# Patient Record
Sex: Male | Born: 1955 | Race: Black or African American | Hispanic: No | Marital: Married | Smoking: Former smoker
Health system: Southern US, Community
[De-identification: ages and names within clinical notes are randomized; demographics above are authoritative.]

## PROBLEM LIST (undated history)

## (undated) ENCOUNTER — Ambulatory Visit: Admission: EM | Payer: Medicare HMO | Source: Home / Self Care

## (undated) DIAGNOSIS — Z720 Tobacco use: Secondary | ICD-10-CM

## (undated) DIAGNOSIS — J449 Chronic obstructive pulmonary disease, unspecified: Secondary | ICD-10-CM

## (undated) DIAGNOSIS — L409 Psoriasis, unspecified: Secondary | ICD-10-CM

## (undated) DIAGNOSIS — I10 Essential (primary) hypertension: Secondary | ICD-10-CM

## (undated) DIAGNOSIS — I255 Ischemic cardiomyopathy: Secondary | ICD-10-CM

## (undated) DIAGNOSIS — I219 Acute myocardial infarction, unspecified: Secondary | ICD-10-CM

## (undated) DIAGNOSIS — H43393 Other vitreous opacities, bilateral: Secondary | ICD-10-CM

## (undated) DIAGNOSIS — E785 Hyperlipidemia, unspecified: Secondary | ICD-10-CM

## (undated) DIAGNOSIS — I2510 Atherosclerotic heart disease of native coronary artery without angina pectoris: Secondary | ICD-10-CM

## (undated) HISTORY — DX: Tobacco use: Z72.0

## (undated) HISTORY — DX: Hyperlipidemia, unspecified: E78.5

## (undated) HISTORY — DX: Other vitreous opacities, bilateral: H43.393

## (undated) HISTORY — DX: Ischemic cardiomyopathy: I25.5

## (undated) HISTORY — DX: Psoriasis, unspecified: L40.9

## (undated) HISTORY — DX: Chronic obstructive pulmonary disease, unspecified: J44.9

## (undated) HISTORY — DX: Essential (primary) hypertension: I10

---

## 2002-04-25 ENCOUNTER — Encounter: Payer: Self-pay | Admitting: Urology

## 2002-04-25 ENCOUNTER — Ambulatory Visit (HOSPITAL_COMMUNITY): Admission: RE | Admit: 2002-04-25 | Discharge: 2002-04-25 | Payer: Self-pay | Admitting: Urology

## 2002-06-28 ENCOUNTER — Encounter: Payer: Self-pay | Admitting: Urology

## 2002-06-28 ENCOUNTER — Ambulatory Visit (HOSPITAL_COMMUNITY): Admission: RE | Admit: 2002-06-28 | Discharge: 2002-06-28 | Payer: Self-pay | Admitting: Urology

## 2003-08-25 ENCOUNTER — Ambulatory Visit (HOSPITAL_COMMUNITY): Admission: RE | Admit: 2003-08-25 | Discharge: 2003-08-25 | Payer: Self-pay | Admitting: Pulmonary Disease

## 2003-09-04 ENCOUNTER — Ambulatory Visit (HOSPITAL_COMMUNITY): Admission: RE | Admit: 2003-09-04 | Discharge: 2003-09-04 | Payer: Self-pay | Admitting: Pulmonary Disease

## 2015-08-18 DIAGNOSIS — L409 Psoriasis, unspecified: Secondary | ICD-10-CM | POA: Insufficient documentation

## 2015-08-18 LAB — HM HEPATITIS C SCREENING LAB: HM Hepatitis Screen: NEGATIVE

## 2015-09-13 ENCOUNTER — Emergency Department (HOSPITAL_COMMUNITY): Payer: BLUE CROSS/BLUE SHIELD

## 2015-09-13 ENCOUNTER — Encounter (HOSPITAL_COMMUNITY): Payer: Self-pay | Admitting: Emergency Medicine

## 2015-09-13 ENCOUNTER — Encounter (HOSPITAL_COMMUNITY)
Admission: EM | Disposition: A | Discharge status: Home / Self Care | Payer: Self-pay | Source: Home / Self Care | Attending: Cardiovascular Disease

## 2015-09-13 ENCOUNTER — Inpatient Hospital Stay (HOSPITAL_COMMUNITY)
Admission: EM | Admit: 2015-09-13 | Discharge: 2015-09-16 | DRG: 247 | Disposition: A | Discharge status: Home / Self Care | Payer: BLUE CROSS/BLUE SHIELD | Attending: Cardiovascular Disease | Admitting: Cardiovascular Disease

## 2015-09-13 DIAGNOSIS — I2102 ST elevation (STEMI) myocardial infarction involving left anterior descending coronary artery: Secondary | ICD-10-CM

## 2015-09-13 DIAGNOSIS — E785 Hyperlipidemia, unspecified: Secondary | ICD-10-CM | POA: Diagnosis present

## 2015-09-13 DIAGNOSIS — R740 Nonspecific elevation of levels of transaminase and lactic acid dehydrogenase [LDH]: Secondary | ICD-10-CM | POA: Diagnosis present

## 2015-09-13 DIAGNOSIS — R7989 Other specified abnormal findings of blood chemistry: Secondary | ICD-10-CM | POA: Diagnosis present

## 2015-09-13 DIAGNOSIS — I213 ST elevation (STEMI) myocardial infarction of unspecified site: Secondary | ICD-10-CM

## 2015-09-13 DIAGNOSIS — I255 Ischemic cardiomyopathy: Secondary | ICD-10-CM | POA: Diagnosis present

## 2015-09-13 DIAGNOSIS — F17210 Nicotine dependence, cigarettes, uncomplicated: Secondary | ICD-10-CM | POA: Diagnosis present

## 2015-09-13 DIAGNOSIS — I2510 Atherosclerotic heart disease of native coronary artery without angina pectoris: Secondary | ICD-10-CM | POA: Diagnosis present

## 2015-09-13 DIAGNOSIS — Z720 Tobacco use: Secondary | ICD-10-CM | POA: Diagnosis not present

## 2015-09-13 DIAGNOSIS — R079 Chest pain, unspecified: Secondary | ICD-10-CM | POA: Diagnosis present

## 2015-09-13 DIAGNOSIS — Z955 Presence of coronary angioplasty implant and graft: Secondary | ICD-10-CM

## 2015-09-13 DIAGNOSIS — I2109 ST elevation (STEMI) myocardial infarction involving other coronary artery of anterior wall: Secondary | ICD-10-CM | POA: Diagnosis not present

## 2015-09-13 HISTORY — PX: CARDIAC CATHETERIZATION: SHX172

## 2015-09-13 HISTORY — DX: ST elevation (STEMI) myocardial infarction involving left anterior descending coronary artery: I21.02

## 2015-09-13 LAB — CBC WITH DIFFERENTIAL/PLATELET
Basophils Absolute: 0 10*3/uL (ref 0.0–0.1)
Basophils Relative: 0 %
Eosinophils Absolute: 0.3 10*3/uL (ref 0.0–0.7)
Eosinophils Relative: 4 %
HCT: 47.4 % (ref 39.0–52.0)
Hemoglobin: 16 g/dL (ref 13.0–17.0)
Lymphocytes Relative: 43 %
Lymphs Abs: 3.4 10*3/uL (ref 0.7–4.0)
MCH: 30.1 pg (ref 26.0–34.0)
MCHC: 33.8 g/dL (ref 30.0–36.0)
MCV: 89.3 fL (ref 78.0–100.0)
Monocytes Absolute: 0.8 10*3/uL (ref 0.1–1.0)
Monocytes Relative: 11 %
Neutro Abs: 3.2 10*3/uL (ref 1.7–7.7)
Neutrophils Relative %: 42 %
Platelets: 250 10*3/uL (ref 150–400)
RBC: 5.31 MIL/uL (ref 4.22–5.81)
RDW: 13.5 % (ref 11.5–15.5)
WBC: 7.7 10*3/uL (ref 4.0–10.5)

## 2015-09-13 LAB — COMPREHENSIVE METABOLIC PANEL
ALT: 46 U/L (ref 17–63)
AST: 29 U/L (ref 15–41)
Albumin: 4.1 g/dL (ref 3.5–5.0)
Alkaline Phosphatase: 70 U/L (ref 38–126)
Anion gap: 6 (ref 5–15)
BUN: 13 mg/dL (ref 6–20)
CO2: 27 mmol/L (ref 22–32)
Calcium: 9 mg/dL (ref 8.9–10.3)
Chloride: 104 mmol/L (ref 101–111)
Creatinine, Ser: 1.12 mg/dL (ref 0.61–1.24)
GFR calc Af Amer: >60 mL/min (ref >60)
GFR calc non Af Amer: >60 mL/min (ref >60)
Glucose, Bld: 152 mg/dL — ABNORMAL HIGH (ref 65–99)
Potassium: 3.9 mmol/L (ref 3.5–5.1)
Sodium: 137 mmol/L (ref 135–145)
Total Bilirubin: 1 mg/dL (ref 0.3–1.2)
Total Protein: 7.1 g/dL (ref 6.5–8.1)

## 2015-09-13 LAB — MRSA PCR SCREENING: MRSA by PCR: NEGATIVE

## 2015-09-13 LAB — TROPONIN I
Troponin I: 0.27 ng/mL — ABNORMAL HIGH (ref <0.031)
Troponin I: >65 ng/mL (ref <0.031)
Troponin I: >65 ng/mL (ref <0.031)

## 2015-09-13 LAB — CBC
HCT: 48.2 % (ref 39.0–52.0)
Hemoglobin: 16.1 g/dL (ref 13.0–17.0)
MCH: 29.7 pg (ref 26.0–34.0)
MCHC: 33.4 g/dL (ref 30.0–36.0)
MCV: 88.8 fL (ref 78.0–100.0)
Platelets: 226 10*3/uL (ref 150–400)
RBC: 5.43 MIL/uL (ref 4.22–5.81)
RDW: 13.5 % (ref 11.5–15.5)
WBC: 8 10*3/uL (ref 4.0–10.5)

## 2015-09-13 LAB — I-STAT CHEM 8, ED
BUN: 13 mg/dL (ref 6–20)
Calcium, Ion: 1.21 mmol/L (ref 1.13–1.30)
Chloride: 102 mmol/L (ref 101–111)
Creatinine, Ser: 1.1 mg/dL (ref 0.61–1.24)
Glucose, Bld: 151 mg/dL — ABNORMAL HIGH (ref 65–99)
HCT: 50 % (ref 39.0–52.0)
Hemoglobin: 17 g/dL (ref 13.0–17.0)
Potassium: 4.5 mmol/L (ref 3.5–5.1)
Sodium: 140 mmol/L (ref 135–145)
TCO2: 26 mmol/L (ref 0–100)

## 2015-09-13 LAB — I-STAT TROPONIN, ED: Troponin i, poc: 0.22 ng/mL (ref 0.00–0.08)

## 2015-09-13 LAB — CREATININE, SERUM
Creatinine, Ser: 0.99 mg/dL (ref 0.61–1.24)
GFR calc Af Amer: >60 mL/min (ref >60)
GFR calc non Af Amer: >60 mL/min (ref >60)

## 2015-09-13 SURGERY — LEFT HEART CATH AND CORONARY ANGIOGRAPHY
Anesthesia: LOCAL

## 2015-09-13 MED ORDER — ASPIRIN 81 MG PO CHEW: 81.0000 mg | CHEWABLE_TABLET | Freq: Every day | ORAL | Status: DC

## 2015-09-13 MED ORDER — ALPRAZOLAM 0.25 MG PO TABS
0.2500 mg | ORAL_TABLET | Freq: Four times a day (QID) | ORAL | Status: DC | PRN
  Administered 2015-09-13: 0.25 mg via ORAL
  Filled 2015-09-13: qty 1

## 2015-09-13 MED ORDER — NITROGLYCERIN 1 MG/10 ML FOR IR/CATH LAB
INTRA_ARTERIAL | Status: DC | PRN
  Administered 2015-09-13 (×2): 200 ug via INTRACORONARY

## 2015-09-13 MED ORDER — SODIUM CHLORIDE 0.9 % IV SOLN: INTRAVENOUS | Status: AC

## 2015-09-13 MED ORDER — NITROGLYCERIN IN D5W 200-5 MCG/ML-% IV SOLN
INTRAVENOUS | Status: AC
  Filled 2015-09-13: qty 250

## 2015-09-13 MED ORDER — FENTANYL CITRATE (PF) 100 MCG/2ML IJ SOLN
INTRAMUSCULAR | Status: AC
  Filled 2015-09-13: qty 2

## 2015-09-13 MED ORDER — PRASUGREL HCL 10 MG PO TABS
10.0000 mg | ORAL_TABLET | Freq: Every day | ORAL | Status: DC
  Administered 2015-09-14 – 2015-09-16 (×3): 10 mg via ORAL
  Filled 2015-09-13 (×3): qty 1

## 2015-09-13 MED ORDER — SODIUM CHLORIDE 0.9 % IJ SOLN: 3.0000 mL | INTRAMUSCULAR | Status: DC | PRN

## 2015-09-13 MED ORDER — SODIUM CHLORIDE 0.9 % IJ SOLN
3.0000 mL | Freq: Two times a day (BID) | INTRAMUSCULAR | Status: DC
  Administered 2015-09-13: 3 mL via INTRAVENOUS
  Administered 2015-09-14: 6 mL via INTRAVENOUS
  Administered 2015-09-15 (×2): 3 mL via INTRAVENOUS

## 2015-09-13 MED ORDER — ENOXAPARIN SODIUM 40 MG/0.4ML ~~LOC~~ SOLN
40.0000 mg | SUBCUTANEOUS | Status: DC
  Administered 2015-09-14 – 2015-09-15 (×2): 40 mg via SUBCUTANEOUS
  Filled 2015-09-13 (×2): qty 0.4

## 2015-09-13 MED ORDER — PRASUGREL HCL 10 MG PO TABS
ORAL_TABLET | ORAL | Status: DC | PRN
  Administered 2015-09-13: 60 mg via ORAL

## 2015-09-13 MED ORDER — LISINOPRIL 5 MG PO TABS
5.0000 mg | ORAL_TABLET | Freq: Every day | ORAL | Status: DC
  Administered 2015-09-13 – 2015-09-16 (×4): 5 mg via ORAL
  Filled 2015-09-13 (×4): qty 1

## 2015-09-13 MED ORDER — PRASUGREL HCL 10 MG PO TABS
ORAL_TABLET | ORAL | Status: AC
  Filled 2015-09-13: qty 6

## 2015-09-13 MED ORDER — NITROGLYCERIN 1 MG/10 ML FOR IR/CATH LAB
INTRA_ARTERIAL | Status: AC
  Filled 2015-09-13: qty 10

## 2015-09-13 MED ORDER — SODIUM CHLORIDE 0.9 % IV SOLN: 250.0000 mL | INTRAVENOUS | Status: DC | PRN

## 2015-09-13 MED ORDER — NITROGLYCERIN IN D5W 200-5 MCG/ML-% IV SOLN
5.0000 ug/min | Freq: Once | INTRAVENOUS | Status: AC
  Administered 2015-09-13: 10 ug/min via INTRAVENOUS

## 2015-09-13 MED ORDER — CARVEDILOL 3.125 MG PO TABS
3.1250 mg | ORAL_TABLET | Freq: Two times a day (BID) | ORAL | Status: DC
  Administered 2015-09-14: 3.125 mg via ORAL
  Filled 2015-09-13: qty 1

## 2015-09-13 MED ORDER — ATORVASTATIN CALCIUM 80 MG PO TABS
80.0000 mg | ORAL_TABLET | Freq: Every day | ORAL | Status: DC
  Administered 2015-09-14: 80 mg via ORAL
  Filled 2015-09-13: qty 1

## 2015-09-13 MED ORDER — HEPARIN (PORCINE) IN NACL 100-0.45 UNIT/ML-% IJ SOLN
INTRAMUSCULAR | Status: AC
Start: 2015-09-13 — End: 2015-09-13
  Filled 2015-09-13: qty 250

## 2015-09-13 MED ORDER — BIVALIRUDIN 250 MG IV SOLR
INTRAVENOUS | Status: AC
  Filled 2015-09-13: qty 250

## 2015-09-13 MED ORDER — MIDAZOLAM HCL 2 MG/2ML IJ SOLN
INTRAMUSCULAR | Status: AC
  Filled 2015-09-13: qty 2

## 2015-09-13 MED ORDER — ONDANSETRON HCL 4 MG/2ML IJ SOLN: 4.0000 mg | Freq: Four times a day (QID) | INTRAMUSCULAR | Status: DC | PRN

## 2015-09-13 MED ORDER — ASPIRIN 81 MG PO CHEW
324.0000 mg | CHEWABLE_TABLET | Freq: Once | ORAL | Status: AC
  Administered 2015-09-13: 324 mg via ORAL

## 2015-09-13 MED ORDER — SODIUM CHLORIDE 0.9 % IV SOLN
250.0000 mg | INTRAVENOUS | Status: DC | PRN
  Administered 2015-09-13: 1.75 mg/kg/h via INTRAVENOUS

## 2015-09-13 MED ORDER — HEPARIN SODIUM (PORCINE) 5000 UNIT/ML IJ SOLN
4000.0000 [IU] | Freq: Once | INTRAMUSCULAR | Status: AC
Start: 2015-09-13 — End: 2015-09-13
  Administered 2015-09-13: 4000 [IU] via INTRAVENOUS

## 2015-09-13 MED ORDER — IOHEXOL 350 MG/ML SOLN
INTRAVENOUS | Status: DC | PRN
  Administered 2015-09-13: 155 mL via INTRAVENOUS

## 2015-09-13 MED ORDER — FENTANYL CITRATE (PF) 100 MCG/2ML IJ SOLN
INTRAMUSCULAR | Status: DC | PRN
  Administered 2015-09-13 (×2): 25 ug via INTRAVENOUS

## 2015-09-13 MED ORDER — VERAPAMIL HCL 2.5 MG/ML IV SOLN
INTRAVENOUS | Status: DC | PRN
  Administered 2015-09-13: 09:00:00 via INTRA_ARTERIAL

## 2015-09-13 MED ORDER — LIDOCAINE HCL (PF) 1 % IJ SOLN
INTRAMUSCULAR | Status: DC | PRN
  Administered 2015-09-13: 10:00:00

## 2015-09-13 MED ORDER — VERAPAMIL HCL 2.5 MG/ML IV SOLN
INTRAVENOUS | Status: AC
  Filled 2015-09-13: qty 2

## 2015-09-13 MED ORDER — LIDOCAINE HCL (PF) 1 % IJ SOLN
INTRAMUSCULAR | Status: AC
  Filled 2015-09-13: qty 30

## 2015-09-13 MED ORDER — BIVALIRUDIN BOLUS VIA INFUSION - CUPID
INTRAVENOUS | Status: DC | PRN
  Administered 2015-09-13: 62.925 mg via INTRAVENOUS

## 2015-09-13 MED ORDER — ASPIRIN 81 MG PO CHEW
CHEWABLE_TABLET | ORAL | Status: AC
Start: 2015-09-13 — End: 2015-09-13
  Filled 2015-09-13: qty 4

## 2015-09-13 MED ORDER — ACETAMINOPHEN 325 MG PO TABS: 650.0000 mg | ORAL_TABLET | ORAL | Status: DC | PRN

## 2015-09-13 MED ORDER — MIDAZOLAM HCL 2 MG/2ML IJ SOLN
INTRAMUSCULAR | Status: DC | PRN
  Administered 2015-09-13: 1 mg via INTRAVENOUS

## 2015-09-13 MED ORDER — HEPARIN (PORCINE) IN NACL 2-0.9 UNIT/ML-% IJ SOLN
INTRAMUSCULAR | Status: AC
  Filled 2015-09-13: qty 1000

## 2015-09-13 SURGICAL SUPPLY — 21 items
BALLN EMERGE MR 2.5X15 (BALLOONS) ×2
BALLN ~~LOC~~ EUPHORA RX 3.5X15 (BALLOONS) ×2
BALLN ~~LOC~~ EUPHORA RX 3.5X20 (BALLOONS) ×2
BALLOON EMERGE MR 2.5X15 (BALLOONS) ×1 IMPLANT
BALLOON ~~LOC~~ EUPHORA RX 3.5X15 (BALLOONS) ×1 IMPLANT
BALLOON ~~LOC~~ EUPHORA RX 3.5X20 (BALLOONS) ×1 IMPLANT
CATH HEARTRAIL 6F IL3.5 (CATHETERS) ×2 IMPLANT
CATH INFINITI 5FR ANG PIGTAIL (CATHETERS) ×2 IMPLANT
CATH INFINITI JR4 5F (CATHETERS) ×2 IMPLANT
DEVICE RAD COMP TR BAND LRG (VASCULAR PRODUCTS) ×2 IMPLANT
ELECT DEFIB PAD ADLT CADENCE (PAD) ×2 IMPLANT
GLIDESHEATH SLEND SS 6F .021 (SHEATH) ×2 IMPLANT
KIT ENCORE 26 ADVANTAGE (KITS) ×2 IMPLANT
KIT HEART LEFT (KITS) ×2 IMPLANT
PACK CARDIAC CATHETERIZATION (CUSTOM PROCEDURE TRAY) ×2 IMPLANT
STENT PROMUS PREM MR 3.0X24 (Permanent Stent) ×2 IMPLANT
SYR MEDRAD MARK V 150ML (SYRINGE) ×2 IMPLANT
TRANSDUCER W/STOPCOCK (MISCELLANEOUS) ×2 IMPLANT
TUBING CIL FLEX 10 FLL-RA (TUBING) ×2 IMPLANT
WIRE RUNTHROUGH .014X180CM (WIRE) ×4 IMPLANT
WIRE SAFE-T 1.5MM-J .035X260CM (WIRE) ×2 IMPLANT

## 2015-09-13 NOTE — H&P (Signed)
History and Physical  Patient ID: Grant Cardenas MRN: [id number] DOB/AGE: 10-Mar-1956 59 y.o. Admit date: 09/13/2015  Primary Care Physician: Alonza Bogus, MD Primary Cardiologist : New (He will follow up with me in [place]).   HPI:  This is a pleasant 59 year old African-American male with known history of hyperlipidemia not on medications, prolonged history of tobacco use (one pack per day since he was 13) and psoriasis. He has no previous cardiac history. He presented with chest pain which started last night around 11 PM but initially was mild and intermittent. He was able to go to sleep but woke up this morning with intense substernal chest tightness which was severe. Thus, he went to the emergency room at [facility] where he was found to have anterior ST elevation on his EKG. Thus, he was transferred for emergent cardiac catheterization.  Review of systems complete and found to be negative unless listed above  Past Medical History  Diagnosis Date  . High cholesterol     Family History  Problem Relation Age of Onset  . Cancer Other     Social History   Social History  . Marital Status: Married    Spouse Name: N/A  . Number of Children: N/A  . Years of Education: N/A   Occupational History  . Not on file.   Social History Main Topics  . Smoking status: Current Every Day Smoker -- 40 years    Types: Cigarettes  . Smokeless tobacco: Not on file  . Alcohol Use: No  . Drug Use: No  . Sexual Activity: No   Other Topics Concern  . Not on file   Social History Narrative  . No narrative on file    History reviewed. No pertinent past surgical history.   No prescriptions prior to admission    Physical Exam: Blood pressure 136/82, pulse 85, temperature 97.7 F (36.5 C), temperature source Oral, resp. rate 18, height 6\' 1"  (1.854 m), weight 185 lb (83.915 kg), SpO2 99 %.   Constitutional: He is oriented to person, place, and time. He appears  well-developed and well-nourished. No distress.  HENT: No nasal discharge.  Head: Normocephalic and atraumatic.  Eyes: Pupils are equal and round.  No discharge. Neck: Normal range of motion. Neck supple. No JVD present. No thyromegaly present.  Cardiovascular: Normal rate, regular rhythm, normal heart sounds. Exam reveals no gallop and no friction rub. No murmur heard.  Pulmonary/Chest: Effort normal and breath sounds normal. No stridor. No respiratory distress. He has no wheezes. He has no rales. He exhibits no tenderness.  Abdominal: Soft. Bowel sounds are normal. He exhibits no distension. There is no tenderness. There is no rebound and no guarding.  Musculoskeletal: Normal range of motion. He exhibits no edema and no tenderness.  Neurological: He is alert and oriented to person, place, and time. Coordination normal.  Skin: Skin is warm and dry. No rash noted. He is not diaphoretic. No erythema. No pallor.  Psychiatric: He has a normal mood and affect. His behavior is normal. Judgment and thought content normal.      Labs:   Lab Results  Component Value Date   WBC 7.7 09/13/2015   HGB 17.0 09/13/2015   HCT 50.0 09/13/2015   MCV 89.3 09/13/2015   PLT 250 09/13/2015    Recent Labs Lab 09/13/15 0718 09/13/15 0722  NA 137 140  K 3.9 4.5  CL 104 102  CO2 27  --   BUN 13 13  CREATININE  1.12 1.10  CALCIUM 9.0  --   PROT 7.1  --   BILITOT 1.0  --   ALKPHOS 70  --   ALT 46  --   AST 29  --   GLUCOSE 152* 151*   Lab Results  Component Value Date   TROPONINI 0.27* 09/13/2015   No results found for: CHOL No results found for: HDL No results found for: LDLCALC No results found for: TRIG No results found for: CHOLHDL No results found for: LDLDIRECT    Radiology: Dg Chest Portable 1 View  09/13/2015  CLINICAL DATA:  Chest pain with myocardial infarction EXAM: PORTABLE CHEST 1 VIEW COMPARISON:  Chest CT September 04, 2003 FINDINGS: There is scarring in the right mid and  lower lung zones. There is asymmetric pleural thickening on the right, also stable. There is no frank edema or consolidation. Heart size and pulmonary vascularity normal. No adenopathy. No bone lesions. IMPRESSION: Scarring in the right mid and lower lung zones, also present on prior examination. No frank edema or consolidation. Cardiac silhouette within normal limits. Electronically Signed   By: Lowella Grip III M.D.   On: 09/13/2015 07:42    EKG:  Normal sinus rhythm with massive anterolateral ST elevation.  ASSESSMENT AND PLAN:  1. Anterior ST elevation myocardial infarction: The patient underwent cardiac catheterization which showed occluded proximal LAD. He was treated successfully with angioplasty and drug-eluting stent placement with no immediate complications. Ejection fraction was 35-40%. He does have significant residual diffuse disease affecting the distal LAD, distal ramus and distal left circumflex and moderate stenosis in the ostial right coronary artery. These can be treated medically with no requirement for staged PCI at the present time. 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 He will require a stress test in 4-6 weeks before he can be cleared to resume work. He is thinking about retirement in February but might apply for short-term disability. Regarding his medications, continue dual antiplatelet therapy for at least 12 months.  2. Ischemic cardiomyopathy: I started small dose carvedilol and lisinopril. Repeat echocardiogram tomorrow.  3. Tobacco use: Smoking cessation was strongly advised and the patient will require a lot of education. He was referred to the cardiac rehabilitation.   Signed: Kathlyn Sacramento MD, [facility] 09/13/2015, 10:06 AM

## 2015-09-13 NOTE — ED Provider Notes (Signed)
CSN: [id number]     Arrival date & time 09/13/15  0709 History   First MD Initiated Contact with Patient 09/13/15 0732     Chief Complaint  Patient presents with  . Chest Pain     (Consider location/radiation/quality/duration/timing/severity/associated sxs/prior Treatment) Patient is a 59 y.o. male presenting with chest pain. The history is provided by the patient.  Chest Pain Associated symptoms: nausea   Associated symptoms: no abdominal pain, no back pain, no fever, no shortness of breath and not vomiting    patient with onset of left-sided chest pain radiating to left arm 11:00 last night. Waxed and waned throughout the night but never went away. Currently pain is 9 out of 10. Associated with nausea no vomiting no shortness of breath. Patient denies any history of any chest problems or heart problems in the past. Patient states only past medical history is psoriasis results medication for that. However chart review shows that he's got a history of high cholesterol and is an everyday smoker.  Past Medical History  Diagnosis Date  . High cholesterol    History reviewed. No pertinent past surgical history. Family History  Problem Relation Age of Onset  . Cancer Other    Social History  Substance Use Topics  . Smoking status: Current Every Day Smoker -- 40 years    Types: Cigarettes  . Smokeless tobacco: None  . Alcohol Use: No    Review of Systems  Constitutional: Negative for fever.  HENT: Negative for congestion.   Eyes: Negative for visual disturbance.  Respiratory: Negative for shortness of breath.   Cardiovascular: Positive for chest pain.  Gastrointestinal: Positive for nausea. Negative for vomiting and abdominal pain.  Genitourinary: Negative for dysuria.  Musculoskeletal: Negative for back pain.  Skin: Negative for rash.  Hematological: Does not bruise/bleed easily.  Psychiatric/Behavioral: Negative for confusion.      Allergies  Review of patient's  allergies indicates no known allergies.  Home Medications   Prior to Admission medications   Not on File   BP 143/84 mmHg  Pulse 80  Temp(Src) 97.7 F (36.5 C) (Oral)  Resp 18  Ht 6\' 1"  (1.854 m)  Wt 83.915 kg  BMI 24.41 kg/m2  SpO2 100% Physical Exam  Constitutional: He is oriented to person, place, and time. He appears well-developed and well-nourished. No distress.  HENT:  Head: Normocephalic and atraumatic.  Mouth/Throat: Oropharynx is clear and moist.  Eyes: Conjunctivae and EOM are normal. Pupils are equal, round, and reactive to light.  Neck: Normal range of motion. Neck supple.  Cardiovascular: Normal rate, regular rhythm and normal heart sounds.   No murmur heard. Pulmonary/Chest: Effort normal and breath sounds normal. No respiratory distress.  Abdominal: Soft. Bowel sounds are normal. There is no tenderness.  Musculoskeletal: Normal range of motion.  Neurological: He is alert and oriented to person, place, and time. No cranial nerve deficit. He exhibits normal muscle tone. Coordination normal.  Skin: Skin is warm. No rash noted.  Nursing note and vitals reviewed.   ED Course  Procedures (including critical care time) Labs Review Labs Reviewed  I-STAT CHEM 8, ED - Abnormal; Notable for the following:    Glucose, Bld 151 (*)    All other components within normal limits  I-STAT TROPOININ, ED - Abnormal; Notable for the following:    Troponin i, poc 0.22 (*)    All other components within normal limits  CBC WITH DIFFERENTIAL/PLATELET  COMPREHENSIVE METABOLIC PANEL  TROPONIN I   Results for  orders placed or performed during the hospital encounter of 09/13/15  I-stat chem 8, ed  Result Value Ref Range   Sodium 140 135 - 145 mmol/L   Potassium 4.5 3.5 - 5.1 mmol/L   Chloride 102 101 - 111 mmol/L   BUN 13 6 - 20 mg/dL   Creatinine, Ser 1.10 0.61 - 1.24 mg/dL   Glucose, Bld 151 (H) 65 - 99 mg/dL   Calcium, Ion 1.21 1.13 - 1.30 mmol/L   TCO2 26 0 - 100 mmol/L    Hemoglobin 17.0 13.0 - 17.0 g/dL   HCT 50.0 39.0 - 52.0 %  I-stat troponin, ED  Result Value Ref Range   Troponin i, poc 0.22 (HH) 0.00 - 0.08 ng/mL   Comment NOTIFIED PHYSICIAN    Comment 3             Imaging Review No results found. I have personally reviewed and evaluated these images and lab results as part of my medical decision-making.   EKG Interpretation   Date/Time:  Sunday September 13 2015 07:14:33 EST Ventricular Rate:  73 PR Interval:  180 QRS Duration: 96 QT Interval:  383 QTC Calculation: 422 R Axis:   81 Text Interpretation:  \E\Sinus rhythm Consider left atrial enlargement  Extensive anterior infarct, acute (LAD) ST elevation, consider inferior  injury no0previous Confirmed by ZACKOWSKI  MD, SCOTT ([id number]) on 09/13/2015  7:34:21 AM      CRITICAL CARE Performed by: Fredia Sorrow Total critical care time: 30 minutes Critical care time was exclusive of separately billable procedures and treating other patients. Critical care was necessary to treat or prevent imminent or life-threatening deterioration. Critical care was time spent personally by me on the following activities: development of treatment plan with patient and/or surrogate as well as nursing, discussions with consultants, evaluation of patient's response to treatment, examination of patient, obtaining history from patient or surrogate, ordering and performing treatments and interventions, ordering and review of laboratory studies, ordering and review of radiographic studies, pulse oximetry and re-evaluation of patient's condition.    MDM   Final diagnoses:  ST elevation myocardial infarction (STEMI), unspecified artery ([place])   Patient with onset of left-sided chest pain rating the left arm 11:00 last night. EKG with the questionable inferior anterior lateral MI changes also could be related to early repull no EKG for comparison. Patient's story is very solid most likely is related to an MI.  Patient's initial point of care troponin came back elevated at 0.22 patient started on heparin and nitroglycerin given aspirin chest x-ray without any acute findings by my interpretation on the machine. Contacted the STEMI cardiology Dr. Fletcher Anon [facility] patient will be transported by EMS.  In addition patient without any cardiac risk factors that he isn't aware of. Patient denied any issues however past medical she does list high cholesterol. And patient is a everyday smoker.  Fredia Sorrow, MD 09/13/15 [phone]

## 2015-09-13 NOTE — Progress Notes (Signed)
Utilization review completed.  

## 2015-09-13 NOTE — ED Notes (Signed)
Patient c/o left side chest pain that radiates down left arm. Denies any shortness of breath but reports feeling nauseated. Patient states pain started last night at 11pm and subsided when he went to bed, pain returned this morning at 6am while walking dog.

## 2015-09-13 NOTE — ED Notes (Signed)
[facility] advised to call EMS

## 2015-09-13 NOTE — ED Notes (Signed)
Called [facility] for code STEMI per verbal order Dr. Rogene Houston

## 2015-09-14 ENCOUNTER — Ambulatory Visit (HOSPITAL_COMMUNITY): Payer: BLUE CROSS/BLUE SHIELD

## 2015-09-14 ENCOUNTER — Encounter (HOSPITAL_COMMUNITY): Payer: Self-pay | Admitting: Cardiovascular Disease

## 2015-09-14 DIAGNOSIS — R7989 Other specified abnormal findings of blood chemistry: Secondary | ICD-10-CM

## 2015-09-14 DIAGNOSIS — I255 Ischemic cardiomyopathy: Secondary | ICD-10-CM

## 2015-09-14 DIAGNOSIS — Z720 Tobacco use: Secondary | ICD-10-CM

## 2015-09-14 DIAGNOSIS — I2510 Atherosclerotic heart disease of native coronary artery without angina pectoris: Secondary | ICD-10-CM

## 2015-09-14 LAB — HEPATIC FUNCTION PANEL
ALT: 86 U/L — ABNORMAL HIGH (ref 17–63)
AST: 307 U/L — ABNORMAL HIGH (ref 15–41)
Albumin: 3.5 g/dL (ref 3.5–5.0)
Alkaline Phosphatase: 65 U/L (ref 38–126)
Bilirubin, Direct: 0.2 mg/dL (ref 0.1–0.5)
Indirect Bilirubin: 1.8 mg/dL — ABNORMAL HIGH (ref 0.3–0.9)
Total Bilirubin: 2 mg/dL — ABNORMAL HIGH (ref 0.3–1.2)
Total Protein: 6.3 g/dL — ABNORMAL LOW (ref 6.5–8.1)

## 2015-09-14 LAB — LIPID PANEL
Cholesterol: 271 mg/dL — ABNORMAL HIGH (ref 0–200)
HDL: 40 mg/dL — ABNORMAL LOW (ref >40)
LDL Cholesterol: 208 mg/dL — ABNORMAL HIGH (ref 0–99)
Total CHOL/HDL Ratio: 6.8 RATIO
Triglycerides: 116 mg/dL (ref <150)
VLDL: 23 mg/dL (ref 0–40)

## 2015-09-14 LAB — CBC
HCT: 47.3 % (ref 39.0–52.0)
Hemoglobin: 15.6 g/dL (ref 13.0–17.0)
MCH: 29.3 pg (ref 26.0–34.0)
MCHC: 33 g/dL (ref 30.0–36.0)
MCV: 88.7 fL (ref 78.0–100.0)
Platelets: 231 10*3/uL (ref 150–400)
RBC: 5.33 MIL/uL (ref 4.22–5.81)
RDW: 13.7 % (ref 11.5–15.5)
WBC: 7.1 10*3/uL (ref 4.0–10.5)

## 2015-09-14 LAB — BRAIN NATRIURETIC PEPTIDE: B Natriuretic Peptide: 77.1 pg/mL (ref 0.0–100.0)

## 2015-09-14 LAB — BASIC METABOLIC PANEL
Anion gap: 8 (ref 5–15)
BUN: 7 mg/dL (ref 6–20)
CO2: 23 mmol/L (ref 22–32)
Calcium: 9.1 mg/dL (ref 8.9–10.3)
Chloride: 107 mmol/L (ref 101–111)
Creatinine, Ser: 1.01 mg/dL (ref 0.61–1.24)
GFR calc Af Amer: >60 mL/min (ref >60)
GFR calc non Af Amer: >60 mL/min (ref >60)
Glucose, Bld: 107 mg/dL — ABNORMAL HIGH (ref 65–99)
Potassium: 4 mmol/L (ref 3.5–5.1)
Sodium: 138 mmol/L (ref 135–145)

## 2015-09-14 LAB — TROPONIN I
Troponin I: >65 ng/mL (ref <0.031)
Troponin I: >65 ng/mL (ref <0.031)

## 2015-09-14 LAB — POCT ACTIVATED CLOTTING TIME: Activated Clotting Time: 477 seconds

## 2015-09-14 MED ORDER — CARVEDILOL 3.125 MG PO TABS
3.1250 mg | ORAL_TABLET | Freq: Once | ORAL | Status: AC
  Administered 2015-09-14: 3.125 mg via ORAL
  Filled 2015-09-14: qty 1

## 2015-09-14 MED ORDER — CARVEDILOL 6.25 MG PO TABS
6.2500 mg | ORAL_TABLET | Freq: Two times a day (BID) | ORAL | Status: DC
  Administered 2015-09-14 – 2015-09-16 (×4): 6.25 mg via ORAL
  Filled 2015-09-14 (×4): qty 1

## 2015-09-14 NOTE — Progress Notes (Signed)
Subjective:    Day of hospitalization: 1   No chest pain, feels good.    Objective:   Temp:  [97.8 F (36.6 C)-99 F (37.2 C)] 97.8 F (36.6 C) (12/12 0749) Pulse Rate:  [61-88] 84 (12/12 0755) Resp:  [14-36] 19 (12/12 0500) BP: (103-162)/(63-105) 103/63 mmHg (12/12 0749) SpO2:  [98 %-100 %] 99 % (12/12 0749) Weight:  [80.8 kg (178 lb 2.1 oz)] 80.8 kg (178 lb 2.1 oz) (12/11 1000) Last BM Date: 09/12/15  Filed Weights   09/13/15 0717 09/13/15 1000  Weight: 83.915 kg (185 lb) 80.8 kg (178 lb 2.1 oz)    Intake/Output Summary (Last 24 hours) at 09/14/15 0946 Last data filed at 09/13/15 2041  Gross per 24 hour  Intake   1150 ml  Output   1700 ml  Net   -550 ml    Physical Exam: General: NAD. HEENT: EOMI. Lungs: CTAB, nonlabored. Cardiac: RRR, no m/r/g. Abdomen: +BS, NT/ND.   Extremities: No LE edema.  Neuro: Alert and oriented x3. Moving all extremities.   Lab Results:  Basic Metabolic Panel:  Recent Labs Lab 09/13/15 0718 09/13/15 0722 09/13/15 1230 09/14/15 0115  NA 137 140  --  138  K 3.9 4.5  --  4.0  CL 104 102  --  107  CO2 27  --   --  23  GLUCOSE 152* 151*  --  107*  BUN 13 13  --  7  CREATININE 1.12 1.10 0.99 1.01  CALCIUM 9.0  --   --  9.1    Liver Function Tests:  Recent Labs Lab 09/13/15 0718 09/14/15 0115  AST 29 307*  ALT 46 86*  ALKPHOS 70 65  BILITOT 1.0 2.0*  PROT 7.1 6.3*  ALBUMIN 4.1 3.5    CBC:  Recent Labs Lab 09/13/15 0718 09/13/15 0722 09/13/15 1230 09/14/15 0115  WBC 7.7  --  8.0 7.1  HGB 16.0 17.0 16.1 15.6  HCT 47.4 50.0 48.2 47.3  MCV 89.3  --  88.8 88.7  PLT 250  --  226 231    Cardiac Enzymes:  Recent Labs Lab 09/13/15 2029 09/14/15 0115 09/14/15 0823  TROPONINI >65.00* >65.00* >65.00*    BNP: No results for input(s): PROBNP in the last 8760 hours.  Coagulation: No results for input(s): INR in the last 168 hours.  Radiology: Dg Chest Portable 1 View  09/13/2015  CLINICAL DATA:   Chest pain with myocardial infarction EXAM: PORTABLE CHEST 1 VIEW COMPARISON:  Chest CT September 04, 2003 FINDINGS: There is scarring in the right mid and lower lung zones. There is asymmetric pleural thickening on the right, also stable. There is no frank edema or consolidation. Heart size and pulmonary vascularity normal. No adenopathy. No bone lesions. IMPRESSION: Scarring in the right mid and lower lung zones, also present on prior examination. No frank edema or consolidation. Cardiac silhouette within normal limits. Electronically Signed   By: Lowella Grip III M.D.   On: 09/13/2015 07:42    Cardiac studies:   ECG:   Medications:   Scheduled Medications: . atorvastatin  80 mg Oral q1800  . carvedilol  3.125 mg Oral Once  . carvedilol  6.25 mg Oral BID WC  . enoxaparin (LOVENOX) injection  40 mg Subcutaneous Q24H  . lisinopril  5 mg Oral Daily  . prasugrel  10 mg Oral Daily  . sodium chloride  3 mL Intravenous Q12H    Infusions:    PRN Medications: sodium chloride,  acetaminophen, ALPRAZolam, ondansetron (ZOFRAN) IV, sodium chloride   Assessment and Plan:   21 YOF with HLD and tobacco abuse with no prior cardiac history admitted to [facility] with CP transferred to [facility] with anterior STEMI.  She underwent emergent cath which showed occluded proximal LAD treated with DES. EF showed 35-40%.    Anterior STEMI Denies current chest pain. -cont effient -add ASA -cont carvedilol, lisinopril  -TTE today   Ischemic cardiomyopathy -cont carvedilol/lisinopril -TTE pending  Elevated transaminases Currently on atorvasttin 80mg .  May be d/t acute MI but also started on statin.  -will trend LFTs -may need to stop given acute elevation   Tobacco abuse -encouraged cessation    Jones Bales, MD PGY-3, Internal Medicine Teaching Service 09/14/2015, 9:46 AM

## 2015-09-14 NOTE — Progress Notes (Signed)
Echocardiogram 2D Echocardiogram has been performed.  Grant Cardenas 09/14/2015, 4:46 PM

## 2015-09-14 NOTE — Care Management Note (Signed)
Case Management Note  Patient Details  Name: SENOVIO YARA MRN: [id number] Date of Birth: 16-Sep-1956  Subjective/Objective:         Adm w mi           Action/Plan: lives w wife, pcp dr Velvet Bathe   Expected Discharge Date:                  Expected Discharge Plan:  Home/Self Care  In-House Referral:     Discharge planning Services  CM Consult, Medication Assistance  Post Acute Care Choice:    Choice offered to:     DME Arranged:    DME Agency:     HH Arranged:    [place] Agency:     Status of Service:     Medicare Important Message Given:    Date Medicare IM Given:    Medicare IM give by:    Date Additional Medicare IM Given:    Additional Medicare Important Message give by:     If discussed at [place] of Stay Meetings, dates discussed:    Additional Comments: gave pt 30day free effient card and 0 copay card for 23months. Pt has bcbs ins/  Lacretia Leigh, RN 09/14/2015, 10:50 AM

## 2015-09-14 NOTE — Progress Notes (Signed)
CARDIAC REHAB PHASE I          MODE:  Ambulation: 700 ft   POST:  Rate/Rhythm: 89 SR  BP:  Supine:   Sitting: 109/61  Standing:    SaO2: 97%RA 1045-1205 Pt up in hall walking independently without CP. Tolerated well. MI education completed with pt and wife who voiced understanding. Discussed stent and importance of effient. Has booklet. Discussed CRP 2 and referring to [place]. Pt given fake cigarette and smoking cessation handout and we discussed smoking cessation. He plans to quit cold [place]. Discussed heart healthy diet. Pt used to eating a lot of sweets at a time. Discussed ways to cut back but pt stated I was killing him taking everything away.. Pt's wife very supportive. Pt will need to make dietary changes.   Grant Good, RN BSN  09/14/2015 12:04 PM

## 2015-09-14 NOTE — Progress Notes (Signed)
EKG CRITICAL VALUE     12 lead EKG performed.  Critical value noted.MAE Manfred Shirts, RN notified.   BROWNING, HORACE L, CCT 09/14/2015 7:44 AM

## 2015-09-14 NOTE — Progress Notes (Signed)
Subjective:  Day 1 s/p Anterior MI; feels well without chest pain or dyspnea.  Objective:   Vital Signs : Filed Vitals:   09/14/15 0400 09/14/15 0500 09/14/15 0749 09/14/15 0755  BP: 121/70 110/73 103/63   Pulse:    84  Temp:  99 F (37.2 C) 97.8 F (36.6 C)   TempSrc:  Oral Oral   Resp: 20 19    Height:      Weight:      SpO2:  98% 99%     Intake/Output from previous day:  Intake/Output Summary (Last 24 hours) at 09/14/15 0801 Last data filed at 09/13/15 2041  Gross per 24 hour  Intake   1150 ml  Output   1700 ml  Net   -550 ml    I/O since admission: -550  Wt Readings from Last 3 Encounters:  09/13/15 178 lb 2.1 oz (80.8 kg)    Medications: . atorvastatin  80 mg Oral q1800  . carvedilol  3.125 mg Oral BID WC  . enoxaparin (LOVENOX) injection  40 mg Subcutaneous Q24H  . lisinopril  5 mg Oral Daily  . prasugrel  10 mg Oral Daily  . sodium chloride  3 mL Intravenous Q12H      Physical Exam:   General appearance: no distress Neck: no adenopathy, no carotid bruit, no JVD and thyroid not enlarged, symmetric, no tenderness/mass/nodules Lungs: decreased BS without wheezing Heart: regular rate and rhythm and 1/6 sem; no s3; no rub Abdomen: soft, non-tender; bowel sounds normal; no masses,  no organomegaly Extremities: no edema, redness or tenderness in the calves or thighs Pulses: 2+ and symmetric Skin: Skin color, texture, turgor normal. No rashes or lesions Neurologic: Grossly normal   Rate: 85  Rhythm: normal sinus rhythm  ECG (independently read by me): NSR at 70 with residual anterior ST elevation at 2 mm; improved from 4 mm when compared to 12/11 and resolution of inferior changes.  Lab Results:   Recent Labs  09/13/15 0718 09/13/15 0722 09/13/15 1230 09/14/15 0115  NA 137 140  --  138  K 3.9 4.5  --  4.0  CL 104 102  --  107  CO2 27  --   --  23  GLUCOSE 152* 151*  --  107*  BUN 13 13  --  7  CREATININE 1.12 1.10 0.99 1.01  CALCIUM  9.0  --   --  9.1    Hepatic Function Latest Ref Rng 09/14/2015 09/13/2015  Total Protein 6.5 - 8.1 g/dL 6.3(L) 7.1  Albumin 3.5 - 5.0 g/dL 3.5 4.1  AST 15 - 41 U/L 307(H) 29  ALT 17 - 63 U/L 86(H) 46  Alk Phosphatase 38 - 126 U/L 65 70  Total Bilirubin 0.3 - 1.2 mg/dL 2.0(H) 1.0  Bilirubin, Direct 0.1 - 0.5 mg/dL 0.2 -     Recent Labs  09/13/15 0718 09/13/15 0722 09/13/15 1230 09/14/15 0115  WBC 7.7  --  8.0 7.1  NEUTROABS 3.2  --   --   --   HGB 16.0 17.0 16.1 15.6  HCT 47.4 50.0 48.2 47.3  MCV 89.3  --  88.8 88.7  PLT 250  --  226 231     Recent Labs  09/13/15 1230 09/13/15 2029 09/14/15 0115  TROPONINI >65.00* >65.00* >65.00*    No results found for: TSH No results for input(s): HGBA1C in the last 72 hours.   Recent Labs  09/13/15 0718 09/14/15 0115  PROT 7.1 6.3*  ALBUMIN  4.1 3.5  AST 29 307*  ALT 46 86*  ALKPHOS 70 65  BILITOT 1.0 2.0*  BILIDIR  --  0.2  IBILI  --  1.8*   No results for input(s): INR in the last 72 hours. BNP (last 3 results) No results for input(s): BNP in the last 8760 hours.  ProBNP (last 3 results) No results for input(s): PROBNP in the last 8760 hours.   Lipid Panel     Component Value Date/Time   CHOL 271* 09/14/2015 0115   TRIG 116 09/14/2015 0115   HDL 40* 09/14/2015 0115   CHOLHDL 6.8 09/14/2015 0115   VLDL 23 09/14/2015 0115   LDLCALC 208* 09/14/2015 0115    Imaging:  Dg Chest Portable 1 View  09/13/2015  CLINICAL DATA:  Chest pain with myocardial infarction EXAM: PORTABLE CHEST 1 VIEW COMPARISON:  Chest CT September 04, 2003 FINDINGS: There is scarring in the right mid and lower lung zones. There is asymmetric pleural thickening on the right, also stable. There is no frank edema or consolidation. Heart size and pulmonary vascularity normal. No adenopathy. No bone lesions. IMPRESSION: Scarring in the right mid and lower lung zones, also present on prior examination. No frank edema or consolidation. Cardiac  silhouette within normal limits. Electronically Signed   By: Lowella Grip III M.D.   On: 09/13/2015 07:42    Emergent Cardiac Cath/PCI 09/13/15: Conclusion     Dist LAD lesion, 90% stenosed.  Ost RCA lesion, 50% stenosed.  There is moderate left ventricular systolic dysfunction.  Dist Cx lesion, 70% stenosed.  Ramus lesion, 80% stenosed.  Prox LAD to Mid LAD lesion, 100% stenosed. Post intervention, there is a 0% residual stenosis. The lesion was not previously treated.  1. Severe one-vessel coronary artery disease with thrombotic occlusion of the proximal LAD which is the culprit for presentation. There is also significant stenosis affecting the distal LAD close to the apex, very distal ramus and distal left circumflex . There is also moderate ostial RCA stenosis.  2. Moderately reduced LV systolic function with an ejection fraction of 35-40% with severe distal anterior wall and distal inferior wall hypokinesis. There is akinesis of the apex. 3. Mildly elevated left ventricular end-diastolic pressure. 4. Successful angioplasty and drug-eluting stent placement to the proximal LAD.    Assessment/Plan:   Active Problems:   ST elevation (STEMI) myocardial infarction involving left anterior descending coronary artery ([place])  1. Day 1 s/p Anterior STEMI RX with PCI to LAD; concomitant CAD 2. Ischemic cardiomyopathy with acute EF 35 - 40% 3.  Abnormal LFT's; prob due to MI but will follow since statin started yesterday 4. Marked Hyperlipidemia with LDL 208 5. Tobacco abuse; long standing counseled on cessation  For 2 d echo today per Dr. Fletcher Anon. Will titrtate carvedilol to 6.25 mg bid.  ACE-I started.  F/U LFT's tomorrow and if still increasing will need to hold statin. Will check BNP post large anterior MI and if significantly elevated consider adding aldosterone blockade.  Troy Sine, MD, [facility] 09/14/2015, 8:01 AM

## 2015-09-15 DIAGNOSIS — I2109 ST elevation (STEMI) myocardial infarction involving other coronary artery of anterior wall: Secondary | ICD-10-CM

## 2015-09-15 DIAGNOSIS — E785 Hyperlipidemia, unspecified: Secondary | ICD-10-CM

## 2015-09-15 DIAGNOSIS — F17200 Nicotine dependence, unspecified, uncomplicated: Secondary | ICD-10-CM

## 2015-09-15 LAB — BASIC METABOLIC PANEL
Anion gap: 7 (ref 5–15)
BUN: 15 mg/dL (ref 6–20)
CO2: 24 mmol/L (ref 22–32)
Calcium: 8.8 mg/dL — ABNORMAL LOW (ref 8.9–10.3)
Chloride: 107 mmol/L (ref 101–111)
Creatinine, Ser: 1.27 mg/dL — ABNORMAL HIGH (ref 0.61–1.24)
GFR calc Af Amer: >60 mL/min (ref >60)
GFR calc non Af Amer: 59 mL/min — ABNORMAL LOW (ref >60)
Glucose, Bld: 108 mg/dL — ABNORMAL HIGH (ref 65–99)
Potassium: 3.9 mmol/L (ref 3.5–5.1)
Sodium: 138 mmol/L (ref 135–145)

## 2015-09-15 LAB — HEPATIC FUNCTION PANEL
ALT: 60 U/L (ref 17–63)
AST: 108 U/L — ABNORMAL HIGH (ref 15–41)
Albumin: 3.4 g/dL — ABNORMAL LOW (ref 3.5–5.0)
Alkaline Phosphatase: 63 U/L (ref 38–126)
Bilirubin, Direct: 0.3 mg/dL (ref 0.1–0.5)
Indirect Bilirubin: 1.7 mg/dL — ABNORMAL HIGH (ref 0.3–0.9)
Total Bilirubin: 2 mg/dL — ABNORMAL HIGH (ref 0.3–1.2)
Total Protein: 6.2 g/dL — ABNORMAL LOW (ref 6.5–8.1)

## 2015-09-15 NOTE — Progress Notes (Signed)
Patient Profile: 59 y/o male smoker admitted for large anterior STEMI  Subjective: Feels well. Denies any CP. No dyspnea. Right radial access site is stable.  Objective: Vital signs in last 24 hours: Temp:  [97.5 F (36.4 C)-99.3 F (37.4 C)] 97.9 F (36.6 C) (12/13 0722) Pulse Rate:  [64-86] 70 (12/13 0722) Resp:  [16-18] 18 (12/13 0722) BP: (94-126)/(56-109) 109/72 mmHg (12/13 0722) SpO2:  [95 %-100 %] 98 % (12/13 0722) Last BM Date: 09/14/15  Intake/Output from previous day: 12/12 0701 - 12/13 0700 In: 6 [I.V.:6] Out: -  Intake/Output this shift:    Medications Current Facility-Administered Medications  Medication Dose Route Frequency Provider Last Rate Last Dose  . 0.9 %  sodium chloride infusion  250 mL Intravenous PRN Wellington Hampshire, MD      . acetaminophen (TYLENOL) tablet 650 mg  650 mg Oral Q4H PRN Wellington Hampshire, MD      . ALPRAZolam Duanne Moron) tablet 0.25 mg  0.25 mg Oral Q6H PRN Wellington Hampshire, MD   0.25 mg at 09/13/15 1422  . atorvastatin (LIPITOR) tablet 80 mg  80 mg Oral q1800 Wellington Hampshire, MD   80 mg at 09/14/15 1616  . carvedilol (COREG) tablet 6.25 mg  6.25 mg Oral BID WC Troy Sine, MD   6.25 mg at 09/14/15 1616  . enoxaparin (LOVENOX) injection 40 mg  40 mg Subcutaneous Q24H Wellington Hampshire, MD   40 mg at 09/14/15 0756  . lisinopril (PRINIVIL,ZESTRIL) tablet 5 mg  5 mg Oral Daily Wellington Hampshire, MD   5 mg at 09/14/15 0953  . ondansetron (ZOFRAN) injection 4 mg  4 mg Intravenous Q6H PRN Wellington Hampshire, MD      . prasugrel (EFFIENT) tablet 10 mg  10 mg Oral Daily Wellington Hampshire, MD   10 mg at 09/14/15 0954  . sodium chloride 0.9 % injection 3 mL  3 mL Intravenous Q12H Wellington Hampshire, MD   3 mL at 09/15/15 0228  . sodium chloride 0.9 % injection 3 mL  3 mL Intravenous PRN Wellington Hampshire, MD        PE: General appearance: alert, cooperative and no distress Neck: no carotid bruit and no JVD Lungs: clear to auscultation  bilaterally Heart: regular rate and rhythm, S1, S2 normal, no murmur, click, rub or gallop Extremities: no LEE Pulses: 2+ and symmetric Skin: warm and dry Neurologic: Grossly normal  Lab Results:   Recent Labs  09/13/15 0718 09/13/15 0722 09/13/15 1230 09/14/15 0115  WBC 7.7  --  8.0 7.1  HGB 16.0 17.0 16.1 15.6  HCT 47.4 50.0 48.2 47.3  PLT 250  --  226 231   BMET  Recent Labs  09/13/15 0718 09/13/15 0722 09/13/15 1230 09/14/15 0115 09/15/15 0302  NA 137 140  --  138 138  K 3.9 4.5  --  4.0 3.9  CL 104 102  --  107 107  CO2 27  --   --  23 24  GLUCOSE 152* 151*  --  107* 108*  BUN 13 13  --  7 15  CREATININE 1.12 1.10 0.99 1.01 1.27*  CALCIUM 9.0  --   --  9.1 8.8*   Hepatic Function Latest Ref Rng 09/15/2015 09/14/2015 09/13/2015  Total Protein 6.5 - 8.1 g/dL 6.2(L) 6.3(L) 7.1  Albumin 3.5 - 5.0 g/dL 3.4(L) 3.5 4.1  AST 15 - 41 U/L 108(H) 307(H) 29  ALT 17 - 63 U/L  60 86(H) 46  Alk Phosphatase 38 - 126 U/L 63 65 70  Total Bilirubin 0.3 - 1.2 mg/dL 2.0(H) 2.0(H) 1.0  Bilirubin, Direct 0.1 - 0.5 mg/dL 0.3 0.2 -    PT/INR No results for input(s): LABPROT, INR in the last 72 hours. Cholesterol  Recent Labs  09/14/15 0115  CHOL 271*   Lipid Panel     Component Value Date/Time   CHOL 271* 09/14/2015 0115   TRIG 116 09/14/2015 0115   HDL 40* 09/14/2015 0115   CHOLHDL 6.8 09/14/2015 0115   VLDL 23 09/14/2015 0115   LDLCALC 208* 09/14/2015 0115    Cardiac Panel (last 3 results)  Recent Labs  09/13/15 2029 09/14/15 0115 09/14/15 0823  TROPONINI >65.00* >65.00* >65.00*   BNP (last 3 results)  Recent Labs  09/14/15 0115  BNP 77.1    ProBNP (last 3 results) No results for input(s): PROBNP in the last 8760 hours.  Studies/Results: 2D Echo 09/14/15  Study Conclusions  - Left ventricle: The cavity size was normal. There was mild focal basal hypertrophy of the septum. Systolic function was normal. The estimated ejection fraction was  in the range of 55% to 60%. Probable akinesis of the apical myocardium. Probable akinesis of the midanteroseptal myocardium. There is akinesis of the apicalanterior myocardium. There was an increased relative contribution of atrial contraction to ventricular filling. Doppler parameters are consistent with abnormal left ventricular relaxation (grade 1 diastolic dysfunction). - Ventricular septum: Septal motion showed paradox.  Assessment/Plan  Active Problems:   ST elevation (STEMI) myocardial infarction involving left anterior descending coronary artery ([place])    1. Anterior STEMI: Day 2 s/p Anterior STEMI RX with PCI to LAD; concomitant CAD. EKG with evolutionary changes. He denies any CP or dyspnea. Continue DAPT with ASA + Effient, Coreg and lisinopril. Will hold atorvastatin today. Will restart once hepatic function improves.   2. Ischemic cardiomyopathy with acute EF 35 - 40%: 2D echo shows improved LVEF of 55-60%. Probable akinesis of the apical myocardium. Probable akinesis of the midanteroseptal myocardium. There is akinesis of the apicalanterior myocardium. Grade 1DD also noted. Continue Coreg and Lisinopril.  3. Abnormal LFT's: prob due to MI. AST improving down from 307 to 108 today. Will hold atorvastatin today and repeat HFTs in the am.   4. Marked Hyperlipidemia: with LDL of 208. Atorvastatin currently on hold due to elevated HFTs. Will repeat labs in the am.   5. Tobacco abuse:  counseled on cessation     LOS: 2 days    Brittainy M. Ladoris Gene 09/15/2015 7:56 AM   Patient seen and examined. Agree with assessment and plan. Day 2 s/p large anterior MI. ECG today now shows significant evolutionary changes anteriorly with T wave inversion new from yesterday. No recurrent angina. Mild dyspnea with ambulation. BNP post MI yesterday was normal at 69 arguing against CHF. LFT are significantly improved but remain elevated and are probably secondary to MI  rather than statin;  Will hold atorvastatin until they normalize. Echo show improvement in LV function c/w acute ventriculogram at cath with antero-apical akinesis without thrombus.  Ambulate today; possible dc tomorrow if stable.  Troy Sine, MD, [facility] 09/15/2015 8:13 AM

## 2015-09-15 NOTE — Progress Notes (Signed)
CARDIAC     MODE:  Ambulation: 700 ft   POST:  Rate/Rhythm: 87 SR BP:  Supine:  Sitting: 125/73  Standing:    SaO2:  1023-1040 Pt up in hall walking independently without CP. Walked with him. Stated he gets a little light headed at times. BP 125/73 after walk. Answered diet questions from wife and referred her to the heart healthy diet given yesterday. Pt encouraged to rest 2 to 3 hours between walks.   Graylon Good, RN BSN  09/15/2015 11:20 AM

## 2015-09-15 NOTE — Progress Notes (Signed)
EKG CRITICAL VALUE     12 lead EKG performed.  Critical value noted.  Vianne Bulls, RN notified.   WALKER, SANDRA C, CCT 09/15/2015 7:42 AM

## 2015-09-16 ENCOUNTER — Telehealth: Payer: Self-pay | Admitting: Cardiovascular Disease

## 2015-09-16 LAB — HEPATIC FUNCTION PANEL
ALT: 47 U/L (ref 17–63)
AST: 60 U/L — ABNORMAL HIGH (ref 15–41)
Albumin: 3.4 g/dL — ABNORMAL LOW (ref 3.5–5.0)
Alkaline Phosphatase: 60 U/L (ref 38–126)
Bilirubin, Direct: 0.3 mg/dL (ref 0.1–0.5)
Indirect Bilirubin: 1.1 mg/dL — ABNORMAL HIGH (ref 0.3–0.9)
Total Bilirubin: 1.4 mg/dL — ABNORMAL HIGH (ref 0.3–1.2)
Total Protein: 6.2 g/dL — ABNORMAL LOW (ref 6.5–8.1)

## 2015-09-16 MED ORDER — ATORVASTATIN CALCIUM 40 MG PO TABS: 20.0000 mg | ORAL_TABLET | Freq: Every day | ORAL | Status: DC

## 2015-09-16 MED ORDER — NITROGLYCERIN 0.4 MG SL SUBL: 0.4000 mg | SUBLINGUAL_TABLET | SUBLINGUAL | Status: AC | PRN

## 2015-09-16 MED ORDER — ATORVASTATIN CALCIUM 20 MG PO TABS: 20.0000 mg | ORAL_TABLET | Freq: Every day | ORAL | Status: DC

## 2015-09-16 MED ORDER — CARVEDILOL 6.25 MG PO TABS: 6.2500 mg | ORAL_TABLET | Freq: Two times a day (BID) | ORAL | Status: DC

## 2015-09-16 MED ORDER — PRASUGREL HCL 10 MG PO TABS: 10.0000 mg | ORAL_TABLET | Freq: Every day | ORAL | Status: DC

## 2015-09-16 MED ORDER — LISINOPRIL 5 MG PO TABS: 5.0000 mg | ORAL_TABLET | Freq: Every day | ORAL | Status: DC

## 2015-09-16 NOTE — Progress Notes (Signed)
Subjective:  Day 3 s/p Anterior MI; no recurrent chest pain ambulating.  Objective:   Vital Signs : Filed Vitals:   09/15/15 1907 09/15/15 2346 09/16/15 0342 09/16/15 0800  BP: 116/66 116/74 119/70 97/56  Pulse:      Temp: 97.8 F (36.6 C) 97.8 F (36.6 C) 97.7 F (36.5 C) 97.9 F (36.6 C)  TempSrc: Oral Oral Oral Oral  Resp: _0 Height:      Weight:      SpO2: 99% 97% 96% 97%    Intake/Output from previous day:  Intake/Output Summary (Last 24 hours) at 09/16/15 0855 Last data filed at 09/15/15 1844  Gross per 24 hour  Intake    500 ml  Output      0 ml  Net    500 ml    I/O since admission: -44  Wt Readings from Last 3 Encounters:  09/13/15 178 lb 2.1 oz (80.8 kg)    Medications: . carvedilol  6.25 mg Oral BID WC  . enoxaparin (LOVENOX) injection  40 mg Subcutaneous Q24H  . lisinopril  5 mg Oral Daily  . prasugrel  10 mg Oral Daily  . sodium chloride  3 mL Intravenous Q12H       Physical Exam:     General appearance: no distress Neck: no adenopathy, no carotid bruit, no JVD and thyroid not enlarged, symmetric, no tenderness/mass/nodules Lungs: decreased BS without wheezing Heart: regular rate and rhythm and 1/6 sem; no s3; no rub Abdomen: soft, non-tender; bowel sounds normal; no masses, no organomegaly Extremities: no edema, redness or tenderness in the calves or thighs Pulses: 2+ and symmetric Skin: Skin color, texture, turgor normal. No rashes or lesions Neurologic: Grossly normal    Rate: 60  Rhythm: normal sinus rhythm   09/16/15 ECG (independently read by me): NSR at 60 QS V1-2  less T wave abnormality  09/15/15 ECG (independently read by me): NSR at 62 with QS V1-2 andevolutionary T wave inversion anteriorly  09/14/15 ECG (independently read by me): NSR at 70 with residual anterior ST elevation at 2 mm; improved from 4 mm when compared to 12/11 and resolution of inferior changes.  Lab Results:   Recent Labs   09/13/15 1230 09/14/15 0115 09/15/15 0302  NA  --  138 138  K  --  4.0 3.9  CL  --  107 107  CO2  --  23 24  GLUCOSE  --  107* 108*  BUN  --  7 15  CREATININE 0.99 1.01 1.27*  CALCIUM  --  9.1 8.8*    Hepatic Function Latest Ref Rng 09/16/2015 09/15/2015 09/14/2015  Total Protein 6.5 - 8.1 g/dL 6.2(L) 6.2(L) 6.3(L)  Albumin 3.5 - 5.0 g/dL 3.4(L) 3.4(L) 3.5  AST 15 - 41 U/L 60(H) 108(H) 307(H)  ALT 17 - 63 U/L 47 60 86(H)  Alk Phosphatase 38 - 126 U/L 60 63 65  Total Bilirubin 0.3 - 1.2 mg/dL 1.4(H) 2.0(H) 2.0(H)  Bilirubin, Direct 0.1 - 0.5 mg/dL 0.3 0.3 0.2     Recent Labs  09/13/15 1230 09/14/15 0115  WBC 8.0 7.1  HGB 16.1 15.6  HCT 48.2 47.3  MCV 88.8 88.7  PLT 226 231     Recent Labs  09/13/15 2029 09/14/15 0115 09/14/15 0823  TROPONINI >65.00* >65.00* >65.00*    No results found for: TSH No results for input(s): HGBA1C in the last 72 hours.   Recent Labs  09/14/15 0115 09/15/15 0302 09/16/15 0247  PROT 6.3* 6.2* 6.2*  ALBUMIN 3.5 3.4* 3.4*  AST 307* 108* 60*  ALT 86* 60 47  ALKPHOS 65 63 60  BILITOT 2.0* 2.0* 1.4*  BILIDIR 0.2 0.3 0.3  IBILI 1.8* 1.7* 1.1*   No results for input(s): INR in the last 72 hours. BNP (last 3 results)  Recent Labs  09/14/15 0115  BNP 77.1    ProBNP (last 3 results) No results for input(s): PROBNP in the last 8760 hours.   Lipid Panel     Component Value Date/Time   CHOL 271* 09/14/2015 0115   TRIG 116 09/14/2015 0115   HDL 40* 09/14/2015 0115   CHOLHDL 6.8 09/14/2015 0115   VLDL 23 09/14/2015 0115   LDLCALC 208* 09/14/2015 0115     Imaging:  Emergent Cardiac Cath/PCI 09/13/15: Conclusion     Dist LAD lesion, 90% stenosed.  Ost RCA lesion, 50% stenosed.  There is moderate left ventricular systolic dysfunction.  Dist Cx lesion, 70% stenosed.  Ramus lesion, 80% stenosed.  Prox LAD to Mid LAD lesion, 100% stenosed. Post intervention, there is a 0% residual stenosis. The lesion was not  previously treated.  1. Severe one-vessel coronary artery disease with thrombotic occlusion of the proximal LAD which is the culprit for presentation. There is also significant stenosis affecting the distal LAD close to the apex, very distal ramus and distal left circumflex . There is also moderate ostial RCA stenosis.  2. Moderately reduced LV systolic function with an ejection fraction of 35-40% with severe distal anterior wall and distal inferior wall hypokinesis. There is akinesis of the apex. 3. Mildly elevated left ventricular end-diastolic pressure. 4. Successful angioplasty and drug-eluting stent placement to the proximal LAD.        2D Echo 09/14/15  - Left ventricle: The cavity size was normal. There was mild focal basal hypertrophy of the septum. Systolic function was normal. The estimated ejection fraction was in the range of 55% to 60%. Probable akinesis of the apical myocardium. Probable akinesis of the midanteroseptal myocardium. There is akinesis of the apicalanterior myocardium. There was an increased relative contribution of atrial contraction to ventricular filling. Doppler parameters are consistent with abnormal left ventricular relaxation (grade 1 diastolic dysfunction). - Ventricular septum: Septal motion showed paradox.  Assessment/Plan:   Active Problems:   ST elevation (STEMI) myocardial infarction involving left anterior descending coronary artery ([facility])   Hyperlipidemia LDL goal <70   1. Anterior STEMI: Day 3 s/p Anterior STEMI RX with PCI to LAD; concomitant CAD. EKG with evolutionary changes. He denies any CP or dyspnea. Continue DAPT with ASA + Effient, Coreg 6.25 mg bid and lisinopril 5 mg.   Atorvastatin was on hold. Will restart once hepatic function improves.   2. Ischemic cardiomyopathy with acute EF 35 - 40%: 2D echo shows improved LVEF of 55-60%. Probable akinesis of the apical myocardium. Probable akinesis of the midanteroseptal  myocardium. There is akinesis of the apicalanterior myocardium. Grade 1DD also noted. Continue Coreg and Lisinopril.  3. Abnormal LFT's: prob due to MI. AST improving down from 307 ==> 108 ==>60 with now normal ALT. Will resume atorvastatin at low dose of 20 mg with outpatient titration and LFT f/u.   4. Marked Hyperlipidemia: with LDL of 208. T resume atorvastatin as above.   5. Tobacco abuse: counseled on cessation   Will plan for dc today. Will not titrate meds today since very mild dizziness with walking. No signs of CHF. Plan for f/u in [place] office.   Marcello Moores  A. Claiborne Billings, MD, [facility] 09/16/2015, 8:55 AM

## 2015-09-16 NOTE — Discharge Summary (Signed)
Discharge Summary   Patient ID: Grant Cardenas,  MRN: [id number], DOB/AGE: 61/11/1955 61 y.o.  Admit date: 09/13/2015 Discharge date: 09/16/2015  Primary Care Provider: HAWKINS,EDWARD L Primary Cardiologist: Dr. Fletcher Anon [facility])   Discharge Diagnoses Active Problems:   ST elevation (STEMI) myocardial infarction involving left anterior descending coronary artery ([place])   Ischemic cardiomyopathy with acute EF 35 - 40%   Hyperlipidemia LDL goal <70   Abnormal LFT's   Tobacco abuse   Allergies No Known Allergies  Consultant: None  Procedures  Emergent Cardiac Cath/PCI 09/13/15: Conclusion     Dist LAD lesion, 90% stenosed.  Ost RCA lesion, 50% stenosed.  There is moderate left ventricular systolic dysfunction.  Dist Cx lesion, 70% stenosed.  Ramus lesion, 80% stenosed.  Prox LAD to Mid LAD lesion, 100% stenosed. Post intervention, there is a 0% residual stenosis. The lesion was not previously treated.  1. Severe one-vessel coronary artery disease with thrombotic occlusion of the proximal LAD which is the culprit for presentation. There is also significant stenosis affecting the distal LAD close to the apex, very distal ramus and distal left circumflex . There is also moderate ostial RCA stenosis.  2. Moderately reduced LV systolic function with an ejection fraction of 35-40% with severe distal anterior wall and distal inferior wall hypokinesis. There is akinesis of the apex. 3. Mildly elevated left ventricular end-diastolic pressure. 4. Successful angioplasty and drug-eluting stent placement to the proximal LAD.            2D Echo 09/14/15  - Left ventricle: The cavity size was normal. There was mild focal basal hypertrophy of the septum. Systolic function was normal. The estimated ejection fraction was in the range of 55% to 60%. Probable akinesis of the apical myocardium. Probable akinesis of the midanteroseptal myocardium. There is  akinesis of the apicalanterior myocardium. There was an increased relative contribution of atrial contraction to ventricular filling. Doppler parameters are consistent with abnormal left ventricular relaxation (grade 1 diastolic dysfunction). - Ventricular septum: Septal motion showed paradox.  History of Present Illness  This is a pleasant 59 year old African-American male with known history of hyperlipidemia not on medications, prolonged history of tobacco use (one pack per day since he was 13) and psoriasis. He has no previous cardiac history. He presented 09/13/15  with chest pain which started night around 11 PM but initially was mild and intermittent. He was able to go to sleep but woke up in morning with intense substernal chest tightness which was severe. Thus, he went to the emergency room at [facility] where he was found to have anterior ST elevation on his EKG. Thus, he was transferred for emergent cardiac catheterization.  Hospital Course  The patient underwent cardiac catheterization which showed occluded proximal LAD. He was treated successfully with angioplasty and drug-eluting stent placement with no immediate complications.Ejection fraction was 35-40%. He does have significant residual diffuse disease affecting the distal LAD, distal ramus and distal left circumflex and moderate stenosis in the ostial right coronary artery. These will be treated medically with no requirement for staged PCI at the present time. Continued DAPT with ASA + Effient, Coreg and lisinopril. 2D echo shows improved LVEF of 55-60%. Probable akinesis of the apical myocardium. Probable akinesis of the midanteroseptal myocardium. There is akinesis of the apicalanterior myocardium. Grade 1DD also noted. Initially placed on high dose statin, however due to elevated LFT hold for one day and then resumed at low dose due to improvement of LFT (AST improving down  from 307 ==> 108 ==>60 with now normal  ALT). Plan for outpatient titration of statin. He will need LFT check during TCM appointment.   She has been seen by Dr. Claiborne Billings today and deemed ready for discharge home. All follow-up appointments have been scheduled. Discharge medications are listed below.   Per H&P by Dr. 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 He will require a stress test in 4-6 weeks before he can be cleared to resume work. He is thinking about retirement in February but might apply for short-term disability". Will give him work letter note until 10/03/14 further management per Dr. Fletcher Anon.   He will f/u with Dr. Fletcher Anon for few times then he will transfer care to [facility] office. Strongly encouraged to stop smoking. Will call office if needed medication.   Discharge Vitals Blood pressure 109/66, pulse 71, temperature 97.8 F (36.6 C), temperature source Oral, resp. rate 16, height 6\' 1"  (1.854 m), weight 178 lb 2.1 oz (80.8 kg), SpO2 98 %.  Filed Weights   09/13/15 0717 09/13/15 1000  Weight: 185 lb (83.915 kg) 178 lb 2.1 oz (80.8 kg)    Labs  CBC  Recent Labs  09/14/15 0115  WBC 7.1  HGB 15.6  HCT 47.3  MCV 88.7  PLT [id number]   Basic Metabolic Panel  Recent Labs  09/14/15 0115 09/15/15 0302  NA 138 138  K 4.0 3.9  CL 107 107  CO2 23 24  GLUCOSE 107* 108*  BUN 7 15  CREATININE 1.01 1.27*  CALCIUM 9.1 8.8*   Liver Function Tests  Recent Labs  09/15/15 0302 09/16/15 0247  AST 108* 60*  ALT 60 47  ALKPHOS 63 60  BILITOT 2.0* 1.4*  PROT 6.2* 6.2*  ALBUMIN 3.4* 3.4*   No results for input(s): LIPASE, AMYLASE in the last 72 hours. Cardiac Enzymes  Recent Labs  09/13/15 2029 09/14/15 0115 09/14/15 0823  TROPONINI >65.00* >65.00* >65.00*   Fasting Lipid Panel  Recent Labs  09/14/15 0115  CHOL 271*  HDL 40*  LDLCALC 208*  TRIG 116   CHOLHDL 6.8    Disposition  Pt is being discharged home today in good condition.  Follow-up Plans & Appointments  Follow-up Information    Follow up with Kathlyn Sacramento, MD. Go on 09/23/2015.   Specialty:  Cardiology   Why:  @11 :00 for TCM appoinment   Contact information:   [place] [address] [phone]           Discharge Instructions    AMB Referral to Cardiac Rehabilitation - Phase II    Complete by:  As directed   Diagnosis:   Myocardial Infarction PCI       Amb Referral to Cardiac Rehabilitation    Complete by:  As directed   Diagnosis:   Myocardial Infarction PCI       Diet - low sodium heart healthy    Complete by:  As directed      Discharge instructions    Complete by:  As directed   No driving for 2 weeks. No lifting over 10 lbs for 4 weeks. No sexual activity for 4 weeks. You may not return to work until cleared by your cardiologist. Keep procedure site clean & dry. If you notice increased pain, swelling, bleeding or pus, call/return!  You may shower, but no soaking baths/hot tubs/pools for 1 week.     Increase activity slowly    Complete by:  As directed            F/u Labs/Studies: LFT check during TCM   Discharge Medications    Medication List    TAKE these medications        atorvastatin 40 MG tablet  Commonly known as:  LIPITOR  Take 0.5 tablets (20 mg total) by mouth daily at 6 PM.     carvedilol 6.25 MG tablet  Commonly known as:  COREG  Take 1 tablet (6.25 mg total) by mouth 2 (two) times daily with a meal.     folic acid 1 MG tablet  Commonly known as:  FOLVITE  Take 1 mg by mouth See admin instructions. Takes 1 tab daily on non methotrexate days     ketoconazole 2 % cream  Commonly known as:  NIZORAL  Apply 1 application topically daily as needed for irritation.     lisinopril 5 MG tablet  Commonly known as:  PRINIVIL,ZESTRIL  Take 1 tablet (5 mg total) by mouth daily.     methotrexate  2.5 MG tablet  Commonly known as:  RHEUMATREX  Take 15 mg by mouth every Friday.     nitroGLYCERIN 0.4 MG SL tablet  Commonly known as:  NITROSTAT  Place 1 tablet (0.4 mg total) under the tongue every 5 (five) minutes as needed for chest pain.     prasugrel 10 MG Tabs tablet  Commonly known as:  EFFIENT  Take 1 tablet (10 mg total) by mouth daily.        Duration of Discharge Encounter   Greater than 30 minutes including physician time.  Signed, Bhagat,Bhavinkumar PA-C 09/16/2015, 1:49 PM

## 2015-09-16 NOTE — Telephone Encounter (Signed)
New message       TCM appt on 09-23-15 with Dr Arida----per Dorthula Nettles.

## 2015-09-16 NOTE — Discharge Instructions (Signed)
Heart Attack A heart attack (myocardial infarction, MI) causes damage to your heart that cannot be fixed. A heart attack can happen when a heart (coronary) artery becomes blocked or narrowed. This cuts off the blood supply and oxygen to your heart. When one or more of your coronary arteries becomes blocked, that area of your heart begins to die. This causes the pain you feel during a heart attack. Heart attack pain can also occur in one part of the body but be felt in another part of the body (referred pain). You may feel referred heart attack pain in your left arm, neck, or jaw. Pain may even be felt in the right arm. CAUSES  Many conditions can cause a heart attack. These include:   Atherosclerosis. This is when a fatty substance (plaque) gradually builds up in the arteries. This buildup can block or reduce the blood supply to one or more of the heart arteries.  A blood clot. A blood clot can develop suddenly when plaque breaks up (ruptures) within a heart artery. A blood clot can block the heart artery, which prevents blood flow to the heart.   Severe tightening (spasm) of the heart artery. This cuts off blood flow through the artery.  RISK FACTORS People at risk for heart attack usually have one or more of the following risk factors:   High blood pressure (hypertension).  High cholesterol.  Smoking.  Being male.  Being overweight or obese.  Older aged.   A family history of heart disease.  Lack of exercise.  Diabetes.  Stress.  Drinking too much alcohol.  Using illegal street drugs, such as cocaine and methamphetamines. SYMPTOMS  Heart attack symptoms can vary from person to person. Symptoms depend on factors like gender and age.   In both men and women, heart attack symptoms can include the following:   Chest pain. This may feel like crushing, squeezing, or a feeling of pressure.  Shortness of breath.  Heartburn or indigestion with or without vomiting,  shortness of breath, or sweating.  Sudden cold sweats.  Sudden light-headedness.  Upper back pain.   Women can have unique heart attack symptoms, such as:   Unexplained feelings of nervousness or anxiety.  Discomfort between the shoulder blades or upper back.  Tingling in the hands and arms.   Older people (of both genders) can have subtle heart attack symptoms, such as:   Sweating.  Shortness of breath.  General tiredness or not feeling well.  DIAGNOSIS  Diagnosing a heart attack involves several tests. They include:   An assessment of your vital signs. This includes checking your:  Heart rhythm.  Blood pressure.  Breathing rate.  Oxygen level.   An ECG (electrocardiogram) to measure the electrical activity of your heart.  Blood tests called cardiac markers. In these tests, blood is drawn at scheduled times to check for the specific proteins or enzymes released by damaged heart muscle.  A chest X-ray.  An echocardiogram to evaluate heart motion and blood flow.  Coronary angiography to look at the heart arteries.  TREATMENT  Treatment for a heart attack may include:   Medicine that breaks up or dissolves blood clots in the heart artery.  Angioplasty.  Cardiac stent placement.  Intra-aortic balloon pump therapy (IABP).  Open heart surgery (coronary artery bypass graft, CABG). HOME CARE INSTRUCTIONS  Take medicines only as directed by your health care provider. You may need to take medicine after a heart attack to:   Keep your blood from   clotting too easily.  Control your blood pressure.  Lower your cholesterol.  Control abnormal heart rhythms.   Do not take the following medicines unless your health care provider approves:  Nonsteroidal anti-inflammatory drugs (NSAIDs), such as ibuprofen, naproxen, or celecoxib.  Vitamin supplements that contain vitamin A, vitamin E, or both.  Hormone replacement therapy that contains estrogen with or  without progestin.  Make lifestyle changes as directed by your health care provider. These may include:   Quitting smoking, if you smoke.  Getting regular exercise. Ask your health care provider to suggest some activities that are safe for you.  Eating a heart-healthy diet. A registered dietitian can help you learn healthy eating options.  Maintaining a healthy weight.   Managing diabetes, if necessary.  Reducing stress.  Limiting how much alcohol you drink. SEEK IMMEDIATE MEDICAL CARE IF:   You have sudden, unexplained chest discomfort.  You have sudden, unexplained discomfort in your arms, back, neck, or jaw.  You have shortness of breath at any time.  You suddenly start to sweat or your skin gets clammy.  You feel nauseous or vomit.  You suddenly feel light-headed or dizzy.  Your heart begins to beat fast or feels like it is skipping beats. These symptoms may represent a serious problem that is an emergency. Do not wait to see if the symptoms will go away. Get medical help right away. Call your local emergency services (911 in the [place] Do not drive yourself to the hospital.   This information is not intended to replace advice given to you by your health care provider. Make sure you discuss any questions you have with your health care provider.   Document Released: 09/19/2005 Document Revised: 10/10/2014 Document Reviewed: 11/22/2013 Elsevier Interactive Patient Education 2016 Elsevier Inc.  

## 2015-09-17 NOTE — Telephone Encounter (Signed)
Patient contacted regarding discharge from [facility] on 09/17/15.  Patient understands to follow up with Dr. Fletcher Anon on 09/23/15 at 11:00 at [facility]. Patient understands discharge instructions? yes Patient understands medications and regiment? yes Patient understands to bring all medications to this visit? yes

## 2015-09-23 ENCOUNTER — Ambulatory Visit: Payer: BLUE CROSS/BLUE SHIELD | Admitting: Cardiovascular Disease

## 2015-09-23 ENCOUNTER — Encounter: Payer: Self-pay | Admitting: Cardiovascular Disease

## 2015-09-23 ENCOUNTER — Ambulatory Visit (INDEPENDENT_AMBULATORY_CARE_PROVIDER_SITE_OTHER): Payer: BLUE CROSS/BLUE SHIELD | Admitting: Cardiovascular Disease

## 2015-09-23 VITALS — BP 80/60 | HR 69 | Ht 73.0 in | Wt 177.2 lb

## 2015-09-23 DIAGNOSIS — Z9889 Other specified postprocedural states: Secondary | ICD-10-CM | POA: Diagnosis not present

## 2015-09-23 DIAGNOSIS — E785 Hyperlipidemia, unspecified: Secondary | ICD-10-CM

## 2015-09-23 DIAGNOSIS — I25119 Atherosclerotic heart disease of native coronary artery with unspecified angina pectoris: Secondary | ICD-10-CM

## 2015-09-23 DIAGNOSIS — I255 Ischemic cardiomyopathy: Secondary | ICD-10-CM | POA: Diagnosis not present

## 2015-09-23 DIAGNOSIS — Z720 Tobacco use: Secondary | ICD-10-CM

## 2015-09-23 MED ORDER — ASPIRIN EC 81 MG PO TBEC: 81.0000 mg | DELAYED_RELEASE_TABLET | Freq: Every day | ORAL | Status: AC

## 2015-09-23 NOTE — Patient Instructions (Signed)
Medication Instructions:  Your physician has recommended you make the following change in your medication:  STOP taking lisinopril START taking 81mg  aspirin daily   Labwork: BMET, liver,lipid today  Testing/Procedures: none  Follow-Up: Your physician recommends that you schedule a follow-up appointment in: one month with Dr. Fletcher Anon.    Any Other Special Instructions Will Be Listed Below (If Applicable).     If you need a refill on your cardiac medications before your next appointment, please call your pharmacy.

## 2015-09-23 NOTE — Progress Notes (Signed)
Primary care physician: Dr. Luan Pulling  HPI  This is a 59 year old male who is here today for a follow-up visit after recent hospitalization for anterior ST elevation myocardial infarction. Cardiac catheterization showed occluded proximal LAD and 60% ostial RCA stenosis. There was also very distal disease affecting the LAD and ramus. He underwent successful angioplasty and drug-eluting stent placement to the LAD. Ejection fraction was 35-40% which improved subsequently an echo. His LFTs were slightly abnormal and thus he was discharged on a smaller dose atorvastatin. He is known to have severe hyperlipidemia. He also has known history of tobacco use but quit smoking since his cardiac event. He has been taking his medications regularly. He complains of extreme dizziness with no syncope. He still feels dyspneic but with no chest pain.  No Known Allergies   Current Outpatient Prescriptions on File Prior to Visit  Medication Sig Dispense Refill  . atorvastatin (LIPITOR) 40 MG tablet Take 0.5 tablets (20 mg total) by mouth daily at 6 PM. 30 tablet 6  . carvedilol (COREG) 6.25 MG tablet Take 1 tablet (6.25 mg total) by mouth 2 (two) times daily with a meal. 60 tablet 6  . folic acid (FOLVITE) 1 MG tablet Take 1 mg by mouth See admin instructions. Takes 1 tab daily on non methotrexate days  3  . ketoconazole (NIZORAL) 2 % cream Apply 1 application topically daily as needed for irritation.     . methotrexate (RHEUMATREX) 2.5 MG tablet Take 15 mg by mouth every Friday.    . nitroGLYCERIN (NITROSTAT) 0.4 MG SL tablet Place 1 tablet (0.4 mg total) under the tongue every 5 (five) minutes as needed for chest pain. 25 tablet 12  . prasugrel (EFFIENT) 10 MG TABS tablet Take 1 tablet (10 mg total) by mouth daily. 30 tablet 6   No current facility-administered medications on file prior to visit.     Past Medical History  Diagnosis Date  . Coronary artery disease involving native coronary artery with angina  pectoris ([place])     Anterior ST elevation myocardial infarction in December 2016. Cardiac catheterization showed occluded proximal LAD and 60% ostial RCA stenosis. There was also very distal disease affecting the LAD and ramus. He underwent successful angioplasty and drug-eluting stent placement to the LAD. Ejection fraction was 35-40% which improved subsequently an echo.  . Hyperlipidemia   . Tobacco use      Past Surgical History  Procedure Laterality Date  . Cardiac catheterization N/A 09/13/2015    Procedure: Left Heart Cath and Coronary Angiography;  Surgeon: Wellington Hampshire, MD;  Location: [place] [place] [facility];  Service: Cardiovascular;  Laterality: N/A;  . Cardiac catheterization N/A 09/13/2015    Procedure: Coronary Stent Intervention;  Surgeon: Wellington Hampshire, MD;  Location: [place] [facility];  Service: Cardiovascular;  Laterality: N/A;     Family History  Problem Relation Age of Onset  . Cancer Other   . Heart Problems Mother   . Heart attack Father   . Heart disease Father      Social History   Social History  . Marital Status: Married    Spouse Name: N/A  . Number of Children: N/A  . Years of Education: N/A   Occupational History  . Not on file.   Social History Main Topics  . Smoking status: Former Smoker -- 40 years    Types: Cigarettes    Quit date: 09/14/2015  . Smokeless tobacco: Not on file  . Alcohol Use: No  .  Drug Use: No  . Sexual Activity: No   Other Topics Concern  . Not on file   Social History Narrative      PHYSICAL EXAM   BP 80/60 mmHg  Pulse 69  Ht 6\' 1"  (1.854 m)  Wt 177 lb 4 oz (80.4 kg)  BMI 23.39 kg/m2  Constitutional: He is oriented to person, place, and time. He appears well-developed and well-nourished. No distress.  HENT: No nasal discharge.  Head: Normocephalic and atraumatic.  Eyes: Pupils are equal and round.  No discharge. Neck: Normal range of motion. Neck supple. No JVD present. No thyromegaly present.    Cardiovascular: Normal rate, regular rhythm, normal heart sounds. Exam reveals no gallop and no friction rub. No murmur heard.  Pulmonary/Chest: Effort normal and breath sounds normal. No stridor. No respiratory distress. He has no wheezes. He has no rales. He exhibits no tenderness.  Abdominal: Soft. Bowel sounds are normal. He exhibits no distension. There is no tenderness. There is no rebound and no guarding.  Musculoskeletal: Normal range of motion. He exhibits no edema and no tenderness.  Neurological: He is alert and oriented to person, place, and time. Coordination normal.  Skin: Skin is warm and dry. No rash noted. He is not diaphoretic. No erythema. No pallor.  Psychiatric: He has a normal mood and affect. His behavior is normal. Judgment and thought content normal.  Right radial pulse is normal with no hematoma.    [id number] sinus rhythm with extensive old anteroseptal infarct with persistent ST elevation   ASSESSMENT AND PLAN

## 2015-09-23 NOTE — Assessment & Plan Note (Signed)
I suspect that his elevated LFTs were likely due to myocardial infarction. I am repeating his LFTs today and is back to normal, I will plan on increasing atorvastatin to 80 mg daily.

## 2015-09-23 NOTE — Assessment & Plan Note (Signed)
The patient is doing reasonably well. For some reason, he was not discharged on aspirin and thus I added that today in addition to Effient. I had a prolonged discussion with him today about the importance of healthy lifestyle. He seems to be very distressed about the fact that he has to cut down on red meat. The plan is to continue dual antiplatelet therapy for at least one year. He still has residual disease that can likely be managed medically. Nonetheless, I will plan on doing a stress test in about one month given his residual coronary artery disease and also to evaluate his eligibility to resume work. 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 He is still not ready for cardiac rehabilitation but should start as soon as he gets the strength.

## 2015-09-23 NOTE — Assessment & Plan Note (Signed)
He has not smoked since his myocardial infarction but he wants to resume. His wife is not getting him do that. I had a prolonged discussion with him about the importance of abstinence from tobacco use.

## 2015-09-23 NOTE — Assessment & Plan Note (Signed)
No signs of fluid overload. His blood pressure is very low and he seems to be symptomatic. Thus, I discontinued lisinopril today. His ejection fraction improved to around 50% on echocardiogram.

## 2015-09-24 LAB — BASIC METABOLIC PANEL
BUN/Creatinine Ratio: 13 (ref 10–22)
BUN: 15 mg/dL (ref 8–27)
CO2: 17 mmol/L — ABNORMAL LOW (ref 18–29)
Calcium: 10.1 mg/dL (ref 8.6–10.2)
Chloride: 96 mmol/L (ref 96–106)
Creatinine, Ser: 1.2 mg/dL (ref 0.76–1.27)
GFR calc Af Amer: 75 mL/min/{1.73_m2} (ref >59)
GFR calc non Af Amer: 65 mL/min/{1.73_m2} (ref >59)
Glucose: 70 mg/dL (ref 65–99)
Potassium: 5.5 mmol/L — ABNORMAL HIGH (ref 3.5–5.2)
Sodium: 137 mmol/L (ref 134–144)

## 2015-09-24 LAB — LIPID PANEL
Chol/HDL Ratio: 5.2 ratio units — ABNORMAL HIGH (ref 0.0–5.0)
Cholesterol, Total: 212 mg/dL — ABNORMAL HIGH (ref 100–199)
HDL: 41 mg/dL (ref >39)
LDL Calculated: 137 mg/dL — ABNORMAL HIGH (ref 0–99)
Triglycerides: 171 mg/dL — ABNORMAL HIGH (ref 0–149)
VLDL Cholesterol Cal: 34 mg/dL (ref 5–40)

## 2015-09-24 LAB — HEPATIC FUNCTION PANEL
ALT: 67 IU/L — ABNORMAL HIGH (ref 0–44)
AST: 37 IU/L (ref 0–40)
Albumin: 4.4 g/dL (ref 3.6–4.8)
Alkaline Phosphatase: 80 IU/L (ref 39–117)
Bilirubin Total: 0.9 mg/dL (ref 0.0–1.2)
Bilirubin, Direct: 0.16 mg/dL (ref 0.00–0.40)
Total Protein: 7.2 g/dL (ref 6.0–8.5)

## 2015-09-25 ENCOUNTER — Other Ambulatory Visit: Payer: Self-pay

## 2015-09-25 DIAGNOSIS — E785 Hyperlipidemia, unspecified: Secondary | ICD-10-CM

## 2015-09-25 MED ORDER — ATORVASTATIN CALCIUM 40 MG PO TABS: 40.0000 mg | ORAL_TABLET | Freq: Every day | ORAL | Status: DC

## 2015-09-29 ENCOUNTER — Telehealth: Payer: Self-pay | Admitting: Cardiovascular Disease

## 2015-09-29 NOTE — Telephone Encounter (Signed)
Left message for pt that Dr. Fletcher Anon has FMLA forms. They should be ready late today and I will call when ready for pick up

## 2015-09-29 NOTE — Telephone Encounter (Signed)
Patient wife says they left fmla forms here at last ov 09/23/15 and Fletcher Anon promised they would be ready for pick up. Please call to discuss.  If no answer leave msg.

## 2015-09-30 NOTE — Telephone Encounter (Signed)
S/w pt to inform him that [facility] paperwork ready. Pt states he will pick up today or tomorrow. Placed at front desk.

## 2015-10-01 ENCOUNTER — Telehealth: Payer: Self-pay | Admitting: Cardiovascular Disease

## 2015-10-01 NOTE — Telephone Encounter (Signed)
Disability forms faxed to [facility] at [facility] 10/01/15 at 1051 am.  Fax:[phone]

## 2015-10-08 ENCOUNTER — Telehealth: Payer: Self-pay

## 2015-10-08 NOTE — Telephone Encounter (Signed)
Pt wife called, inquiring about status of pt disability papers. I informed her I would call [employer] to check status and call her back.

## 2015-10-09 ENCOUNTER — Telehealth: Payer: Self-pay

## 2015-10-09 NOTE — Telephone Encounter (Signed)
Pt wife called, states pt needs a letter stating pt diagnosis, and states pt could not work full or part time. Pt needs this for disability. Please call. Fax to [employer] Attn: Izora Gala [phone]. Please call and notify when this is done.

## 2015-10-12 NOTE — Telephone Encounter (Signed)
The final determination of his ability to go back to work will depend on the results of the stress test which we are planning to do later this month.

## 2015-10-12 NOTE — Telephone Encounter (Signed)
S/w pt regarding request for disability. Reviewed Dr. Tyrell Antonio notes for stress test to evaluate eligibility to resume work. Confirmed Jan 31 appt w/Chris Berge Pt verbalized understanding with no further questions.

## 2015-10-12 NOTE — Telephone Encounter (Signed)
From 12/21 OV notes: "I will plan on doing a stress test in about one month given his residual coronary artery disease and also to evaluate his eligibility to resume work. 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 He is still not ready for cardiac rehabilitation but should start as soon as he gets the strength."  Forward to Dr. Fletcher Anon to advise.

## 2015-10-27 ENCOUNTER — Encounter: Payer: Self-pay | Admitting: Physician Assistant

## 2015-10-27 NOTE — Progress Notes (Signed)
Cardiology Office Note Date:  10/27/2015  Patient ID:  Grant Cardenas, Grant Cardenas [birth year], MRN [id number] PCP:  Alonza Bogus, MD  Cardiologist:  Dr. Fletcher Anon, MD  refresh   Chief Complaint:   History of Present Illness: Grant Cardenas is a 60 y.o. male with history of CAD s/p recent anterior ST elevation MI s/p PCI/DES to LAD in 09/2015, HLD, and tobacco abuse who presents for routine follow up of his CAD. Cardiac catheterization showed occluded proximal LAD and 60% ostial RCA stenosis. There was also very distal disease affecting the LAD and ramus. He underwent successful angioplasty and drug-eluting stent placement to the LAD. Ejection fraction was 35-40% which improved subsequently on echo to 55-60%. His LFTs were slightly abnormal and thus he was discharged on a smaller dose atorvastatin. He is known to have severe hyperlipidemia. He also has known history of tobacco use but quit smoking since his cardiac event. He continues to take his medications regularly. 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 He plans to start cardiac rehab when he has the strength.   nuc stress test meds chest pain? bmet    Past Medical History  Diagnosis Date  . Coronary artery disease involving native coronary artery with angina pectoris ([place])     a. anterior ST elevation MI in 09/2015. Cardiac cath showed occluded prox LAD and 60% ostial RCA stenosis. There was also very distal disease affecting the LAD and ramus. He underwent successful angioplasty and drug-eluting stent placement to the LAD. Ejection fraction was 35-40% which improved subsequently an echo to 55-60%.  . Hyperlipidemia   . Tobacco use   . Ischemic cardiomyopathy     a. EF by cath 09/13/2015: 35-40%; b. 09/14/2015: EF 55-60%, Probable akinesis of  the midanteroseptal myocardium. akinesis of the apicalanterior myocardium, GR1DD    Past Surgical History  Procedure  Laterality Date  . Cardiac catheterization N/A 09/13/2015    Procedure: Left Heart Cath and Coronary Angiography;  Surgeon: Wellington Hampshire, MD;  Location: [facility];  Service: Cardiovascular;  Laterality: N/A;  . Cardiac catheterization N/A 09/13/2015    Procedure: Coronary Stent Intervention;  Surgeon: Wellington Hampshire, MD;  Location: [facility];  Service: Cardiovascular;  Laterality: N/A;    Current Outpatient Prescriptions  Medication Sig Dispense Refill  . aspirin EC 81 MG tablet Take 1 tablet (81 mg total) by mouth daily. 90 tablet 3  . atorvastatin (LIPITOR) 40 MG tablet Take 1 tablet (40 mg total) by mouth daily at 6 PM. 30 tablet 6  . carvedilol (COREG) 6.25 MG tablet Take 1 tablet (6.25 mg total) by mouth 2 (two) times daily with a meal. 60 tablet 6  . folic acid (FOLVITE) 1 MG tablet Take 1 mg by mouth See admin instructions. Takes 1 tab daily on non methotrexate days  3  . ketoconazole (NIZORAL) 2 % cream Apply 1 application topically daily as needed for irritation.     . nitroGLYCERIN (NITROSTAT) 0.4 MG SL tablet Place 1 tablet (0.4 mg total) under the tongue every 5 (five) minutes as needed for chest pain. 25 tablet 12  . prasugrel (EFFIENT) 10 MG TABS tablet Take 1 tablet (10 mg total) by mouth daily. 30 tablet 6   No current facility-administered medications for this visit.    Allergies:   Review of patient's allergies indicates no known allergies.   Social History:  The patient  reports that he quit smoking about 6 weeks ago. His smoking use included Cigarettes. He quit after 40 years of use. He does not have any smokeless tobacco history on file. He reports that he does not drink alcohol or use illicit drugs.   Family History:  The patient's family history includes Cancer in his other; Heart Problems in his mother; Heart attack in his father; Heart disease in his father.  ROS:  Please see the history of present illness. Otherwise, review of systems is  positive for .   All other systems are reviewed and otherwise negative.   PHYSICAL EXAM:  VS:  There were no vitals taken for this visit. BMI: There is no weight on file to calculate BMI. Well nourished, well developed, in no acute distress HEENT: normocephalic, atraumatic Neck: no JVD, carotid bruits or masses Cardiac:  normal S1, S2; RRR; no murmurs, rubs, or gallops Lungs:  clear to auscultation bilaterally, no wheezing, rhonchi or rales Abd: soft, nontender, no hepatomegaly, + BS MS: no deformity or atrophy Ext: no edema Skin: warm and dry, no rash Neuro:  moves all extremities spontaneously, no focal abnormalities noted, follows commands Psych: euthymic mood, full affect   EKG:  Was ordered today. Shows   Recent Labs: 09/14/2015: B Natriuretic Peptide 77.1; Hemoglobin 15.6; Platelets 231 09/23/2015: ALT 67*; BUN 15; Creatinine, Ser 1.20; Potassium 5.5*; Sodium 137  09/14/2015: VLDL 23 09/23/2015: Chol/HDL Ratio 5.2*; Cholesterol, Total 212*; HDL 41; LDL Calculated 137*; Triglycerides 171*   CrCl cannot be calculated (Unknown ideal weight.).   Wt Readings from Last 3 Encounters:  09/23/15 177 lb 4 oz (80.4 kg)  09/13/15 178 lb 2.1 oz (80.8 kg)     Other studies reviewed: Additional studies/records reviewed today include: summarized above  ASSESSMENT AND PLAN:  1. CAD s/p PCI as above: 2. HLD: 3. Tobacco abuse: 4. Hyperkalemia:  Disposition: F/u with   Current medicines are reviewed at length with the patient today.  The patient did not have any concerns regarding medicines  Signed, Christell Faith PA-C 10/27/2015 8:33 PM     [address], [place] [address] [phone]  This encounter was created in error - please disregard.

## 2015-10-28 ENCOUNTER — Encounter: Payer: BLUE CROSS/BLUE SHIELD | Admitting: Physician Assistant

## 2015-10-29 ENCOUNTER — Ambulatory Visit
Admission: RE | Admit: 2015-10-29 | Discharge: 2015-10-29 | Disposition: A | Discharge status: Home / Self Care | Payer: BLUE CROSS/BLUE SHIELD | Source: Ambulatory Visit | Attending: Cardiovascular Disease | Admitting: Cardiovascular Disease

## 2015-10-29 ENCOUNTER — Ambulatory Visit (INDEPENDENT_AMBULATORY_CARE_PROVIDER_SITE_OTHER): Payer: BLUE CROSS/BLUE SHIELD | Admitting: Cardiovascular Disease

## 2015-10-29 ENCOUNTER — Encounter: Payer: Self-pay | Admitting: Cardiovascular Disease

## 2015-10-29 VITALS — BP 100/60 | HR 71 | Ht 73.0 in | Wt 181.2 lb

## 2015-10-29 DIAGNOSIS — I255 Ischemic cardiomyopathy: Secondary | ICD-10-CM

## 2015-10-29 DIAGNOSIS — I25119 Atherosclerotic heart disease of native coronary artery with unspecified angina pectoris: Secondary | ICD-10-CM | POA: Diagnosis not present

## 2015-10-29 DIAGNOSIS — R0602 Shortness of breath: Secondary | ICD-10-CM | POA: Diagnosis not present

## 2015-10-29 DIAGNOSIS — E785 Hyperlipidemia, unspecified: Secondary | ICD-10-CM | POA: Diagnosis not present

## 2015-10-29 MED ORDER — METOPROLOL TARTRATE 25 MG PO TABS: 25.0000 mg | ORAL_TABLET | Freq: Two times a day (BID) | ORAL | Status: DC

## 2015-10-29 MED ORDER — CARVEDILOL 6.25 MG PO TABS: 6.2500 mg | ORAL_TABLET | Freq: Two times a day (BID) | ORAL | Status: DC

## 2015-10-29 NOTE — Progress Notes (Signed)
Primary care physician: Dr. Luan Pulling  HPI  This is a 60 year old male who is here today for a follow-up visit regarding coronary artery disease and chronic systolic heart failure due to ischemic cardiomyopathy. He presented in December 2016 with anterior ST elevation myocardial infarction. Cardiac catheterization showed occluded proximal LAD and 60% ostial RCA stenosis. There was also very distal disease affecting the LAD and ramus. He underwent successful angioplasty and drug-eluting stent placement to the LAD. Ejection fraction was 35-40% which improved subsequently on echo to normal. During last visit, he was noted to be hypotensive and he was dizzy. Thus, I discontinued lisinopril. He has not had any recurrent chest pain. However, he continues to have significant exertional dyspnea. He reports dyspnea in the morning after he takes carvedilol and Effient. He continues to be extremely anxious and worried about his long-term prognosis.  No Known Allergies   Current Outpatient Prescriptions on File Prior to Visit  Medication Sig Dispense Refill  . aspirin EC 81 MG tablet Take 1 tablet (81 mg total) by mouth daily. 90 tablet 3  . atorvastatin (LIPITOR) 40 MG tablet Take 1 tablet (40 mg total) by mouth daily at 6 PM. 30 tablet 6  . carvedilol (COREG) 6.25 MG tablet Take 1 tablet (6.25 mg total) by mouth 2 (two) times daily with a meal. 60 tablet 6  . folic acid (FOLVITE) 1 MG tablet Take 1 mg by mouth See admin instructions. Takes 1 tab daily on non methotrexate days  3  . ketoconazole (NIZORAL) 2 % cream Apply 1 application topically daily as needed for irritation.     . nitroGLYCERIN (NITROSTAT) 0.4 MG SL tablet Place 1 tablet (0.4 mg total) under the tongue every 5 (five) minutes as needed for chest pain. 25 tablet 12  . prasugrel (EFFIENT) 10 MG TABS tablet Take 1 tablet (10 mg total) by mouth daily. 30 tablet 6   No current facility-administered medications on file prior to visit.     Past  Medical History  Diagnosis Date  . Coronary artery disease involving native coronary artery with angina pectoris ([place])     a. anterior ST elevation MI in 09/2015. Cardiac cath showed occluded prox LAD and 60% ostial RCA stenosis. There was also very distal disease affecting the LAD and ramus. He underwent successful angioplasty and drug-eluting stent placement to the LAD. Ejection fraction was 35-40% which improved subsequently an echo to 55-60%.  . Hyperlipidemia   . Tobacco use   . Ischemic cardiomyopathy     a. EF by cath 09/13/2015: 35-40%; b. 09/14/2015: EF 55-60%, Probable akinesis of  the midanteroseptal myocardium. akinesis of the apicalanterior myocardium, GR1DD     Past Surgical History  Procedure Laterality Date  . Cardiac catheterization N/A 09/13/2015    Procedure: Left Heart Cath and Coronary Angiography;  Surgeon: Wellington Hampshire, MD;  Location: [facility];  Service: Cardiovascular;  Laterality: N/A;  . Cardiac catheterization N/A 09/13/2015    Procedure: Coronary Stent Intervention;  Surgeon: Wellington Hampshire, MD;  Location: [facility];  Service: Cardiovascular;  Laterality: N/A;     Family History  Problem Relation Age of Onset  . Cancer Other   . Heart Problems Mother   . Heart attack Father   . Heart disease Father      Social History   Social History  . Marital Status: Married    Spouse Name: N/A  . Number of Children: N/A  . Years of Education: N/A  Occupational History  . Not on file.   Social History Main Topics  . Smoking status: Former Smoker -- 40 years    Types: Cigarettes    Quit date: 09/14/2015  . Smokeless tobacco: Not on file  . Alcohol Use: No  . Drug Use: No  . Sexual Activity: No   Other Topics Concern  . Not on file   Social History Narrative      PHYSICAL EXAM   BP 100/60 mmHg  Pulse 71  Ht 6\' 1"  (1.854 m)  Wt 181 lb 4 oz (82.214 kg)  BMI 23.92 kg/m2  Constitutional: He is oriented to person,  place, and time. He appears well-developed and well-nourished. No distress.  HENT: No nasal discharge.  Head: Normocephalic and atraumatic.  Eyes: Pupils are equal and round.  No discharge. Neck: Normal range of motion. Neck supple. No JVD present. No thyromegaly present.  Cardiovascular: Normal rate, regular rhythm, normal heart sounds. Exam reveals no gallop and no friction rub. No murmur heard.  Pulmonary/Chest: Effort normal and breath sounds normal. No stridor. No respiratory distress. He has no wheezes. He has no rales. He exhibits no tenderness.  Abdominal: Soft. Bowel sounds are normal. He exhibits no distension. There is no tenderness. There is no rebound and no guarding.  Musculoskeletal: Normal range of motion. He exhibits no edema and no tenderness.  Neurological: He is alert and oriented to person, place, and time. Coordination normal.  Skin: Skin is warm and dry. No rash noted. He is not diaphoretic. No erythema. No pallor.  Psychiatric: He has a normal mood and affect. His behavior is normal. Judgment and thought content normal.      ASSESSMENT AND PLAN

## 2015-10-29 NOTE — Assessment & Plan Note (Addendum)
The patient had no recurrent chest pain. However, he continues to complain of exertional dyspnea and fatigue. He appears to be very anxious. Some of his dyspnea might be related to treatment with carvedilol given his previous tobacco use. I offered to switch this medication to metoprolol but he wants to continue to see if he adjusts to it. Due to his dyspnea, I requested a chest x-ray as well. He does have residual coronary artery disease and thus I'm going to request a treadmill nuclear stress test to see if there is any residual ischemia. 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 Given his continued symptoms, I don't think he would be able to go back to that a more. The patient and his wife want to apply for disability for early retirement which I think is reasonable considering his cardiac event.

## 2015-10-29 NOTE — Assessment & Plan Note (Signed)
His ejection fraction would be checked again with nuclear stress test. If EF is reduced,  I will consider resuming a small dose ACE inhibitor.

## 2015-10-29 NOTE — Patient Instructions (Addendum)
Medication Instructions:  Your physician recommends that you continue on your current medications as directed. Please refer to the Current Medication list given to you today.   Labwork: none  Testing/Procedures: A chest x-ray takes a picture of the organs and structures inside the chest, including the heart, lungs, and blood vessels. This test can show several things, including, whether the heart is enlarges; whether fluid is building up in the lungs; and whether pacemaker / defibrillator leads are still in place.  Your physician has requested that you have a lexiscan myoview. For further information please visit [email or web address]. Please follow instruction sheet, as given.  [place]  Your caregiver has ordered a Stress Test with nuclear imaging. The purpose of this test is to evaluate the blood supply to your heart muscle. This procedure is referred to as a "Non-Invasive Stress Test." This is because other than having an IV started in your vein, nothing is inserted or "invades" your body. Cardiac stress tests are done to find areas of poor blood flow to the heart by determining the extent of coronary artery disease (CAD). Some patients exercise on a treadmill, which naturally increases the blood flow to your heart, while others who are  unable to walk on a treadmill due to physical limitations have a pharmacologic/chemical stress agent called Lexiscan . This medicine will mimic walking on a treadmill by temporarily increasing your coronary blood flow.   Please note: these test may take anywhere between 2-4 hours to complete  PLEASE REPORT TO [place] AT THE FIRST DESK WILL DIRECT YOU WHERE TO GO  Date of Procedure:______Friday, Feb 3_________________________  Arrival Time for Procedure:_______8:15am____________________  Instructions regarding medication:    _xx___:  Hold coreg the night before procedure and morning of procedure   PLEASE NOTIFY  THE OFFICE AT LEAST 24 HOURS IN ADVANCE IF YOU ARE UNABLE TO KEEP YOUR APPOINTMENT.  [phone] AND  PLEASE NOTIFY NUCLEAR MEDICINE AT [facility] AT LEAST 24 HOURS IN ADVANCE IF YOU ARE UNABLE TO KEEP YOUR APPOINTMENT. [phone]  How to prepare for your Myoview test:   Do not eat or drink after midnight  No caffeine for 24 hours prior to test  No smoking 24 hours prior to test.  Your medication may be taken with water.  If your doctor stopped a medication because of this test, do not take that medication.  Ladies, please do not wear dresses.  Skirts or pants are appropriate. Please wear a short sleeve shirt.  No perfume, cologne or lotion.  Wear comfortable walking shoes. No heels!            Follow-Up: Your physician recommends that you schedule a follow-up appointment in: 2 months   Any Other Special Instructions Will Be Listed Below (If Applicable).     If you need a refill on your cardiac medications before your next appointment, please call your pharmacy.  Cardiac Nuclear Scanning A cardiac nuclear scan is used to check your heart for problems, such as the following:  A portion of the heart is not getting enough blood.  Part of the heart muscle has died, which happens with a heart attack.  The heart wall is not working normally.  In this test, a radioactive dye (tracer) is injected into your bloodstream. After the tracer has traveled to your heart, a scanning device is used to measure how much of the tracer is absorbed by or distributed to various areas of your heart. [place]  PROVIDER KNOW ABOUT:  Any allergies you have.  All medicines you are taking, including vitamins, herbs, eye drops, creams, and over-the-counter medicines.  Previous problems you or members of your family have had with the use of anesthetics.  Any blood disorders you have.  Previous surgeries you have had.  Medical conditions you have.  RISKS AND  COMPLICATIONS Generally, this is a safe procedure. However, as with any procedure, problems can occur. Possible problems include:   Serious chest pain.  Rapid heartbeat.  Sensation of warmth in your chest. This usually passes quickly. BEFORE THE PROCEDURE Ask your health care provider about changing or stopping your regular medicines. PROCEDURE This procedure is usually done at a hospital and takes 2-4 hours.  An IV tube is inserted into one of your veins.  Your health care provider will inject a small amount of radioactive tracer through the tube.  You will then wait for 20-40 minutes while the tracer travels through your bloodstream.  You will lie down on an exam table so images of your heart can be taken. Images will be taken for about 15-20 minutes.  You will exercise on a treadmill or stationary bike. While you exercise, your heart activity will be monitored with an electrocardiogram (ECG), and your blood pressure will be checked.  If you are unable to exercise, you may be given a medicine to make your heart beat faster.  When blood flow to your heart has peaked, tracer will again be injected through the IV tube.  After 20-40 minutes, you will get back on the exam table and have more images taken of your heart.  When the procedure is over, your IV tube will be removed. AFTER THE PROCEDURE  You will likely be able to leave shortly after the test. Unless your health care provider tells you otherwise, you may return to your normal schedule, including diet, activities, and medicines.  Make sure you find out how and when you will get your test results.   This information is not intended to replace advice given to you by your health care provider. Make sure you discuss any questions you have with your health care provider.   Document Released: 10/14/2004 Document Revised: 09/24/2013 Document Reviewed: 08/28/2013 Elsevier Interactive Patient Education [employer].

## 2015-10-29 NOTE — Assessment & Plan Note (Signed)
Lab Results  Component Value Date   CHOL 212* 09/23/2015   HDL 41 09/23/2015   LDLCALC 137* 09/23/2015   TRIG 171* 09/23/2015   CHOLHDL 5.2* 09/23/2015   The dose of atorvastatin was increased to 40 mg daily. He will require follow-up lipid and liver profile.

## 2015-10-30 ENCOUNTER — Telehealth: Payer: Self-pay

## 2015-10-30 NOTE — Telephone Encounter (Signed)
Faxed cardiac rehab clearance to [phone]

## 2015-11-02 ENCOUNTER — Inpatient Hospital Stay (HOSPITAL_COMMUNITY)
Admission: EM | Admit: 2015-11-02 | Discharge: 2015-11-03 | DRG: 287 | Disposition: A | Discharge status: Home / Self Care | Payer: BLUE CROSS/BLUE SHIELD | Attending: Cardiovascular Disease | Admitting: Cardiovascular Disease

## 2015-11-02 ENCOUNTER — Encounter (HOSPITAL_COMMUNITY): Payer: Self-pay | Admitting: Emergency Medicine

## 2015-11-02 ENCOUNTER — Other Ambulatory Visit: Payer: Self-pay | Admitting: Adult Health

## 2015-11-02 ENCOUNTER — Emergency Department (HOSPITAL_COMMUNITY): Payer: BLUE CROSS/BLUE SHIELD

## 2015-11-02 DIAGNOSIS — E782 Mixed hyperlipidemia: Secondary | ICD-10-CM

## 2015-11-02 DIAGNOSIS — I2510 Atherosclerotic heart disease of native coronary artery without angina pectoris: Secondary | ICD-10-CM | POA: Diagnosis not present

## 2015-11-02 DIAGNOSIS — I252 Old myocardial infarction: Secondary | ICD-10-CM | POA: Diagnosis not present

## 2015-11-02 DIAGNOSIS — F17201 Nicotine dependence, unspecified, in remission: Secondary | ICD-10-CM

## 2015-11-02 DIAGNOSIS — I2102 ST elevation (STEMI) myocardial infarction involving left anterior descending coronary artery: Secondary | ICD-10-CM | POA: Diagnosis present

## 2015-11-02 DIAGNOSIS — Z87891 Personal history of nicotine dependence: Secondary | ICD-10-CM | POA: Diagnosis not present

## 2015-11-02 DIAGNOSIS — I25110 Atherosclerotic heart disease of native coronary artery with unstable angina pectoris: Principal | ICD-10-CM | POA: Diagnosis present

## 2015-11-02 DIAGNOSIS — Z8679 Personal history of other diseases of the circulatory system: Secondary | ICD-10-CM | POA: Diagnosis not present

## 2015-11-02 DIAGNOSIS — Z7982 Long term (current) use of aspirin: Secondary | ICD-10-CM | POA: Diagnosis not present

## 2015-11-02 DIAGNOSIS — I25119 Atherosclerotic heart disease of native coronary artery with unspecified angina pectoris: Secondary | ICD-10-CM | POA: Diagnosis present

## 2015-11-02 DIAGNOSIS — I255 Ischemic cardiomyopathy: Secondary | ICD-10-CM | POA: Diagnosis present

## 2015-11-02 DIAGNOSIS — I200 Unstable angina: Secondary | ICD-10-CM | POA: Diagnosis not present

## 2015-11-02 DIAGNOSIS — R079 Chest pain, unspecified: Secondary | ICD-10-CM | POA: Diagnosis present

## 2015-11-02 DIAGNOSIS — Z955 Presence of coronary angioplasty implant and graft: Secondary | ICD-10-CM | POA: Diagnosis not present

## 2015-11-02 DIAGNOSIS — E785 Hyperlipidemia, unspecified: Secondary | ICD-10-CM | POA: Diagnosis present

## 2015-11-02 DIAGNOSIS — Z720 Tobacco use: Secondary | ICD-10-CM | POA: Diagnosis present

## 2015-11-02 HISTORY — DX: Acute myocardial infarction, unspecified: I21.9

## 2015-11-02 HISTORY — DX: Atherosclerotic heart disease of native coronary artery without angina pectoris: I25.10

## 2015-11-02 LAB — PROTIME-INR
INR: 0.96 (ref 0.00–1.49)
INR: 1.03 (ref 0.00–1.49)
Prothrombin Time: 13 seconds (ref 11.6–15.2)
Prothrombin Time: 13.7 seconds (ref 11.6–15.2)

## 2015-11-02 LAB — APTT: aPTT: 25 seconds (ref 24–37)

## 2015-11-02 LAB — BASIC METABOLIC PANEL
Anion gap: 7 (ref 5–15)
BUN: 15 mg/dL (ref 6–20)
CO2: 26 mmol/L (ref 22–32)
Calcium: 9.5 mg/dL (ref 8.9–10.3)
Chloride: 106 mmol/L (ref 101–111)
Creatinine, Ser: 1.13 mg/dL (ref 0.61–1.24)
GFR calc Af Amer: >60 mL/min (ref >60)
GFR calc non Af Amer: >60 mL/min (ref >60)
Glucose, Bld: 120 mg/dL — ABNORMAL HIGH (ref 65–99)
Potassium: 4.5 mmol/L (ref 3.5–5.1)
Sodium: 139 mmol/L (ref 135–145)

## 2015-11-02 LAB — HEPARIN LEVEL (UNFRACTIONATED): Heparin Unfractionated: 0.26 IU/mL — ABNORMAL LOW (ref 0.30–0.70)

## 2015-11-02 LAB — CBC
HCT: 44.8 % (ref 39.0–52.0)
Hemoglobin: 15.1 g/dL (ref 13.0–17.0)
MCH: 30.1 pg (ref 26.0–34.0)
MCHC: 33.7 g/dL (ref 30.0–36.0)
MCV: 89.4 fL (ref 78.0–100.0)
Platelets: 218 10*3/uL (ref 150–400)
RBC: 5.01 MIL/uL (ref 4.22–5.81)
RDW: 14 % (ref 11.5–15.5)
WBC: 7 10*3/uL (ref 4.0–10.5)

## 2015-11-02 LAB — TROPONIN I: Troponin I: <0.03 ng/mL (ref <0.031)

## 2015-11-02 MED ORDER — KETOCONAZOLE 2 % EX CREA
1.0000 "application " | TOPICAL_CREAM | Freq: Every day | CUTANEOUS | Status: DC | PRN
  Filled 2015-11-02: qty 15

## 2015-11-02 MED ORDER — SODIUM CHLORIDE 0.9 % WEIGHT BASED INFUSION
3.0000 mL/kg/h | INTRAVENOUS | Status: DC
  Administered 2015-11-03: 3 mL/kg/h via INTRAVENOUS

## 2015-11-02 MED ORDER — ASPIRIN 81 MG PO CHEW
243.0000 mg | CHEWABLE_TABLET | Freq: Once | ORAL | Status: AC
  Administered 2015-11-02: 243 mg via ORAL

## 2015-11-02 MED ORDER — SODIUM CHLORIDE 0.9 % WEIGHT BASED INFUSION
1.0000 mL/kg/h | INTRAVENOUS | Status: DC
  Administered 2015-11-03: 1 mL/kg/h via INTRAVENOUS

## 2015-11-02 MED ORDER — SODIUM CHLORIDE 0.9% FLUSH: 3.0000 mL | INTRAVENOUS | Status: DC | PRN

## 2015-11-02 MED ORDER — HEPARIN (PORCINE) IN NACL 100-0.45 UNIT/ML-% IJ SOLN
1400.0000 [IU]/h | INTRAMUSCULAR | Status: DC
  Administered 2015-11-02: 1000 [IU]/h via INTRAVENOUS
  Filled 2015-11-02: qty 250

## 2015-11-02 MED ORDER — HEPARIN (PORCINE) IN NACL 100-0.45 UNIT/ML-% IJ SOLN
1000.0000 [IU]/h | INTRAMUSCULAR | Status: DC
  Administered 2015-11-02: 1000 [IU]/h via INTRAVENOUS
  Filled 2015-11-02 (×2): qty 250

## 2015-11-02 MED ORDER — SODIUM CHLORIDE 0.9 % IV SOLN: 250.0000 mL | INTRAVENOUS | Status: DC | PRN

## 2015-11-02 MED ORDER — CARVEDILOL 3.125 MG PO TABS
6.2500 mg | ORAL_TABLET | Freq: Two times a day (BID) | ORAL | Status: DC
  Administered 2015-11-03 (×2): 6.25 mg via ORAL
  Filled 2015-11-02: qty 2
  Filled 2015-11-02: qty 1

## 2015-11-02 MED ORDER — ATORVASTATIN CALCIUM 40 MG PO TABS
40.0000 mg | ORAL_TABLET | Freq: Every day | ORAL | Status: DC
  Administered 2015-11-02 – 2015-11-03 (×2): 40 mg via ORAL
  Filled 2015-11-02 (×2): qty 1

## 2015-11-02 MED ORDER — ASPIRIN 81 MG PO CHEW
81.0000 mg | CHEWABLE_TABLET | ORAL | Status: AC
  Administered 2015-11-03: 81 mg via ORAL
  Filled 2015-11-02: qty 1

## 2015-11-02 MED ORDER — METHOTREXATE 2.5 MG PO TABS: 15.0000 mg | ORAL_TABLET | ORAL | Status: DC

## 2015-11-02 MED ORDER — HEPARIN BOLUS VIA INFUSION
4000.0000 [IU] | Freq: Once | INTRAVENOUS | Status: AC
  Administered 2015-11-02: 4000 [IU] via INTRAVENOUS

## 2015-11-02 MED ORDER — PRASUGREL HCL 10 MG PO TABS
10.0000 mg | ORAL_TABLET | Freq: Every day | ORAL | Status: DC
  Administered 2015-11-03: 10 mg via ORAL
  Filled 2015-11-02: qty 1

## 2015-11-02 MED ORDER — ASPIRIN 81 MG PO CHEW
324.0000 mg | CHEWABLE_TABLET | Freq: Once | ORAL | Status: DC
  Filled 2015-11-02: qty 4

## 2015-11-02 MED ORDER — NITROGLYCERIN 0.4 MG SL SUBL: 0.4000 mg | SUBLINGUAL_TABLET | SUBLINGUAL | Status: DC | PRN

## 2015-11-02 MED ORDER — CETYLPYRIDINIUM CHLORIDE 0.05 % MT LIQD
7.0000 mL | Freq: Two times a day (BID) | OROMUCOSAL | Status: DC
  Administered 2015-11-03: 7 mL via OROMUCOSAL

## 2015-11-02 MED ORDER — NITROGLYCERIN IN D5W 200-5 MCG/ML-% IV SOLN
5.0000 ug/min | Freq: Once | INTRAVENOUS | Status: AC
  Administered 2015-11-02: 5 ug/min via INTRAVENOUS
  Filled 2015-11-02: qty 250

## 2015-11-02 MED ORDER — ASPIRIN EC 81 MG PO TBEC
81.0000 mg | DELAYED_RELEASE_TABLET | Freq: Every day | ORAL | Status: DC
  Filled 2015-11-02: qty 1

## 2015-11-02 MED ORDER — SODIUM CHLORIDE 0.9% FLUSH
3.0000 mL | Freq: Two times a day (BID) | INTRAVENOUS | Status: DC
  Administered 2015-11-02 – 2015-11-03 (×2): 3 mL via INTRAVENOUS

## 2015-11-02 MED ORDER — FOLIC ACID 1 MG PO TABS
1.0000 mg | ORAL_TABLET | ORAL | Status: DC
  Administered 2015-11-03: 1 mg via ORAL
  Filled 2015-11-02: qty 1

## 2015-11-02 MED ORDER — NITROGLYCERIN IN D5W 200-5 MCG/ML-% IV SOLN: 0.0000 ug/min | INTRAVENOUS | Status: DC

## 2015-11-02 NOTE — ED Provider Notes (Signed)
CSN: [id number]     Arrival date & time 11/02/15  1538 History   First MD Initiated Contact with Patient 11/02/15 1552     Chief Complaint  Patient presents with  . Chest Pain     (Consider location/radiation/quality/duration/timing/severity/associated sxs/prior Treatment) HPI Comments: Patient presents with central chest "tightness" is been constant since 11:30 this morning. He took a total of 4 nitroglycerin with minimal relief. He denies any radiation of the pain. Denies any shortness of breath, nausea, vomiting or chills. Pain is similar to the MI he experienced in December but is less intense. Patient with STEMI 09/13/2015 with occluded LAD. He received one stent and angioplasty. He states he has not had pain since then. He saw his cardiologist 4 days ago and everything was okay. He denies any back pain or abdominal pain. Denies any focal weakness, numbness or tingling. No vomiting. No fever or chills.  Patient is a 60 y.o. male presenting with chest pain. The history is provided by the patient.  Chest Pain Associated symptoms: shortness of breath   Associated symptoms: no abdominal pain, no cough, no dizziness, no fever, no headache, no nausea, not vomiting and no weakness     Past Medical History  Diagnosis Date  . CAD (coronary artery disease)     a. anterior ST elevation MI in 09/2015. Cardiac cath showed occluded prox LAD and 60% ostial RCA stenosis. There was also very distal disease affecting the LAD and ramus. He underwent successful angioplasty and drug-eluting stent placement to the LAD. Ejection fraction was 35-40% which improved subsequently an echo to 55-60%.  . Hyperlipidemia   . Tobacco use   . Ischemic cardiomyopathy     a. EF by cath 09/13/2015: 35-40%; b. 09/14/2015: EF 55-60%, Probable akinesis of  the midanteroseptal myocardium. akinesis of the apicalanterior myocardium, GR1DD  . MI (myocardial infarction) [facility])    Past Surgical History  Procedure Laterality Date   . Cardiac catheterization N/A 09/13/2015    Procedure: Left Heart Cath and Coronary Angiography;  Surgeon: Wellington Hampshire, MD;  Location: [place] [facility];  Service: Cardiovascular;  Laterality: N/A;  . Cardiac catheterization N/A 09/13/2015    Procedure: Coronary Stent Intervention;  Surgeon: Wellington Hampshire, MD;  Location: [place] [facility];  Service: Cardiovascular;  Laterality: N/A;   Family History  Problem Relation Age of Onset  . Cancer Other   . Heart Problems Mother   . Heart attack Father   . Heart disease Father    Social History  Substance Use Topics  . Smoking status: Former Smoker -- 40 years    Types: Cigarettes    Quit date: 09/14/2015  . Smokeless tobacco: None  . Alcohol Use: No    Review of Systems  Constitutional: Negative for fever, activity change and appetite change.  HENT: Negative for congestion.   Respiratory: Positive for chest tightness and shortness of breath. Negative for cough.   Cardiovascular: Positive for chest pain.  Gastrointestinal: Negative for nausea, vomiting and abdominal pain.  Genitourinary: Negative for dysuria, hematuria and testicular pain.  Musculoskeletal: Negative for myalgias and arthralgias.  Skin: Negative for rash.  Neurological: Negative for dizziness, weakness and headaches.  A complete 10 system review of systems was obtained and all systems are negative except as noted in the HPI and PMH.      Allergies  Review of patient's allergies indicates no known allergies.  Home Medications   Prior to Admission medications   Medication Sig Start Date  End Date Taking? Authorizing Provider  aspirin EC 81 MG tablet Take 1 tablet (81 mg total) by mouth daily. 09/23/15  Yes Wellington Hampshire, MD  atorvastatin (LIPITOR) 40 MG tablet Take 1 tablet (40 mg total) by mouth daily at 6 PM. 09/25/15  Yes Wellington Hampshire, MD  carvedilol (COREG) 6.25 MG tablet Take 1 tablet (6.25 mg total) by mouth 2 (two) times daily with a meal.  10/29/15  Yes Wellington Hampshire, MD  clobetasol ointment (TEMOVATE) [id number] % Apply 1 application topically 2 (two) times daily.  10/25/15  Yes Historical Provider, MD  folic acid (FOLVITE) 1 MG tablet Take 1 mg by mouth See admin instructions. Takes 1 tab daily except Fridays 08/19/15  Yes Historical Provider, MD  ketoconazole (NIZORAL) 2 % cream Apply 1 application topically daily as needed for irritation.  05/21/14  Yes Historical Provider, MD  ketoconazole (NIZORAL) 2 % shampoo Apply 1 application topically 2 (two) times a week.  10/09/15  Yes Historical Provider, MD  methotrexate (RHEUMATREX) 2.5 MG tablet Take 15 mg by mouth every Friday. Take 2.5 mg (6 tablets) once weekly. 10/06/15 11/05/15 Yes Historical Provider, MD  nitroGLYCERIN (NITROSTAT) 0.4 MG SL tablet Place 1 tablet (0.4 mg total) under the tongue every 5 (five) minutes as needed for chest pain. 09/16/15  Yes Bhavinkumar Bhagat, PA  prasugrel (EFFIENT) 10 MG TABS tablet Take 1 tablet (10 mg total) by mouth daily. 09/16/15  Yes Bhavinkumar Bhagat, PA  triamcinolone ointment (KENALOG) 0.1 % APPLY TOPICALLY TWICE DAILY UNTIL RASH RESOLVED THEN PRN 10/27/15  Yes Historical Provider, MD   BP 129/77 mmHg  Pulse 82  Temp(Src) 97.9 F (36.6 C) (Oral)  Resp 24  Ht 6\' 1"  (1.854 m)  Wt 176 lb 12.9 oz (80.2 kg)  BMI 23.33 kg/m2  SpO2 99% Physical Exam  Constitutional: He is oriented to person, place, and time. He appears well-developed and well-nourished. No distress.  HENT:  Head: Normocephalic and atraumatic.  Mouth/Throat: Oropharynx is clear and moist. No oropharyngeal exudate.  Eyes: Conjunctivae and EOM are normal. Pupils are equal, round, and reactive to light.  Neck: Normal range of motion. Neck supple.  No meningismus.  Cardiovascular: Normal rate, regular rhythm, normal heart sounds and intact distal pulses.   No murmur heard. Equal radial pulses and grip strengths  Pulmonary/Chest: Effort normal and breath sounds normal. No  respiratory distress. He exhibits no tenderness.  Abdominal: Soft. There is no tenderness. There is no rebound and no guarding.  Musculoskeletal: Normal range of motion. He exhibits no edema or tenderness.  Neurological: He is alert and oriented to person, place, and time. No cranial nerve deficit. He exhibits normal muscle tone. Coordination normal.  No ataxia on finger to nose bilaterally. No pronator drift. 5/5 strength throughout. CN 2-12 intact.Equal grip strength. Sensation intact.   Skin: Skin is warm.  Psychiatric: He has a normal mood and affect. His behavior is normal.  Nursing note and vitals reviewed.   ED Course  Procedures (including critical care time) Labs Review Labs Reviewed  BASIC METABOLIC PANEL - Abnormal; Notable for the following:    Glucose, Bld 120 (*)    All other components within normal limits  HEPARIN LEVEL (UNFRACTIONATED) - Abnormal; Notable for the following:    Heparin Unfractionated 0.26 (*)    All other components within normal limits  MRSA PCR SCREENING  CBC  TROPONIN I  PROTIME-INR  APTT  PROTIME-INR  HEPARIN LEVEL (UNFRACTIONATED)  CBC    Imaging  Review Dg Chest 2 View  11/02/2015  CLINICAL DATA:  Left-sided chest tightness. Prior myocardial infarction. Tobacco use. Hyperlipidemia. EXAM: CHEST  2 VIEW COMPARISON:  10/29/2015.  CT 09/04/2003. FINDINGS: Midline trachea. Normal heart size and mediastinal contours. No pleural effusion or pneumothorax. Right-sided pleural-parenchymal scarring with pleural calcifications are similar, especially within the lateral right mid chest. Clear left lung. IMPRESSION: No acute cardiopulmonary disease. Right-sided pleural parenchymal scarring, as before. Electronically Signed   By: Abigail Miyamoto M.D.   On: 11/02/2015 16:43   I have personally reviewed and evaluated these images and lab results as part of my medical decision-making.   EKG Interpretation   Date/Time:  Monday November 02 2015 15:58:54  EST Ventricular Rate:  74 PR Interval:  173 QRS Duration: 93 QT Interval:  348 QTC Calculation: 386 R Axis:   68 Text Interpretation:  Sinus rhythm Inferior infarct, acute (LCx) Anterior  infarct, old similar to 09/23/15, minimal J point elevation inferiorly,  d/w Dr. Tamala Julian Confirmed by Wyvonnia Dusky  MD, [place] [phone]) on 11/02/2015  4:15:26 PM      MDM   Final diagnoses:  Unstable angina ([place])    History of CAD with ongoing chest pain since 11:30. Minimal relief with nitroglycerin. No radiation of pain. Equal radial pulses and grip strengths.  EKG shows stable ST elevation V2 and V3. There is minimal J-point elevation in the inferior leads. Discussed with Dr. Tamala Julian on arrival. He does not feel this meets STEMI criteria.  Patient will receive additional aspirin today to complete 324 mg dose. We'll start heparin and nitroglycerin drip.  Troponin is negative. Chest x-ray stable. Patient seen by Dr. Domenic Polite. His pain is improving with nitroglycerin and heparin. Patient's pain is somewhat pleuritic. Pulmonary embolism is considered less likely as he has no hypoxia and no tachycardia. His pain is much improved after IV nitroglycerin. Low suspicion for aortic dissection. Strong suspicion for unstable angina.  We'll arrange for transfer [facility] for catheterization likely tomorrow. Dr. Domenic Polite agrees no need for emergent catheterization tonight. He also does not favor PE.  CRITICAL CARE Performed by: Ezequiel Essex Total critical care time: 45 minutes Critical care time was exclusive of separately billable procedures and treating other patients. Critical care was necessary to treat or prevent imminent or life-threatening deterioration. Critical care was time spent personally by me on the following activities: development of treatment plan with patient and/or surrogate as well as nursing, discussions with consultants, evaluation of patient's response to treatment, examination of  patient, obtaining history from patient or surrogate, ordering and performing treatments and interventions, ordering and review of laboratory studies, ordering and review of radiographic studies, pulse oximetry and re-evaluation of patient's condition.    Ezequiel Essex, MD 11/02/15 [phone]

## 2015-11-02 NOTE — Progress Notes (Signed)
ANTICOAGULATION CONSULT NOTE - Initial Consult  Pharmacy Consult for Heparin Indication: chest pain/ACS  No Known Allergies  Patient Measurements: Height: 6\' 1"  (185.4 cm) Weight: 178 lb (80.74 kg) IBW/kg (Calculated) : 79.9 HEPARIN DW (KG): 80.7  Vital Signs: Temp: 97.9 F (36.6 C) (01/30 1545) Temp Source: Oral (01/30 1545) BP: 113/75 mmHg (01/30 1615) Pulse Rate: 76 (01/30 1615)  Labs:  Recent Labs  11/02/15 1555  HGB 15.1  HCT 44.8  PLT 218    CrCl cannot be calculated (Patient has no serum creatinine result on file.).  Medical History: Past Medical History  Diagnosis Date  . Coronary artery disease involving native coronary artery with angina pectoris ([place])     a. anterior ST elevation MI in 09/2015. Cardiac cath showed occluded prox LAD and 60% ostial RCA stenosis. There was also very distal disease affecting the LAD and ramus. He underwent successful angioplasty and drug-eluting stent placement to the LAD. Ejection fraction was 35-40% which improved subsequently an echo to 55-60%.  . Hyperlipidemia   . Tobacco use   . Ischemic cardiomyopathy     a. EF by cath 09/13/2015: 35-40%; b. 09/14/2015: EF 55-60%, Probable akinesis of  the midanteroseptal myocardium. akinesis of the apicalanterior myocardium, GR1DD  . MI (myocardial infarction) ([place])    Assessment: Pt reports left cp tightness since 1130 this morning, pt has had 4 nitros today. Pt has had sob. Pt speaking in complete sentences. Had MI in December with stents placed. asked to initiate Heparin. No anticoagulants listed on PTA med list.  Pt on Effient per PTA list. Labs pending.  NKA reported.   Goal of Therapy:  Heparin level 0.3-0.7 units/ml Monitor platelets by anticoagulation protocol: Yes   Plan: Heparin bolus 4000 units now x 1 Heparin infusion at 1000 units/hr Heparin level in 6 hrs then daily Heparin level and CBC daily while on Heparin  [place] Crane, Scott A 11/02/2015,4:22 PM

## 2015-11-02 NOTE — ED Notes (Signed)
MD at bedside. 

## 2015-11-02 NOTE — Consult Note (Signed)
Primary cardiologist: Dr. Kathlyn Sacramento  Reason for admission: Unstable angina  Clinical Summary Grant Cardenas is a 60 y.o.male with past medical history outlined below, presenting to the [facility] today with recent, recurring chest discomfort described as a squeezing sensation (he uses a clenched fist to describe it). He is here with his wife. She states that he has not "felt right" since this past Friday. They had visitors, and she states that he seemed restless, getting up and walking around more than normal. He states that he has been more short of breath, also anxious, reports developing chest discomfort earlier this morning which has waxed and waned throughout the day and was not responsive to sublingual nitroglycerin at home. In the ER he has been placed on IV nitroglycerin with improving symptoms although not complete resolution as yet. Heparin drip has also just been started. He does describe his symptoms in somewhat of a pleuritic character, but not entirely. He has had no cough, hemoptysis, or hypoxia.  As noted below his cardiac history includes recent anterior wall infarct in December 2016. He was managed with DES to the LAD which was the culprit vessel, and had other residual lesions including moderate ostial RCA disease, distal LAD, ramus and circumflex disease that were managed medically. Although initially showing evidence of cardiomyopathy, his LVEF improved to the range of 55-60% by follow-up echocardiogram with anteroseptal akinesis and grade 1 diastolic dysfunction.  He states that he has been taking his medications as directed including both aspirin and Effient.  He just recently saw Dr. Fletcher Anon for a follow-up visit on January 26 at which time a Myoview was arranged for assessment of residual ischemic burden in light of reported exertional dyspnea and fatigue. This test was scheduled for Friday.  Dr. Pernell Dupre was originally contacted about this patient per Dr. Wyvonnia Dusky  for activation of STEMI, although this was canceled.  No Known Allergies  Home Medications No current facility-administered medications on file prior to encounter.   Current Outpatient Prescriptions on File Prior to Encounter  Medication Sig Dispense Refill  . aspirin EC 81 MG tablet Take 1 tablet (81 mg total) by mouth daily. 90 tablet 3  . atorvastatin (LIPITOR) 40 MG tablet Take 1 tablet (40 mg total) by mouth daily at 6 PM. 30 tablet 6  . carvedilol (COREG) 6.25 MG tablet Take 1 tablet (6.25 mg total) by mouth 2 (two) times daily with a meal. 30 tablet 1  . folic acid (FOLVITE) 1 MG tablet Take 1 mg by mouth See admin instructions. Takes 1 tab daily except Fridays  3  . ketoconazole (NIZORAL) 2 % cream Apply 1 application topically daily as needed for irritation.     . methotrexate (RHEUMATREX) 2.5 MG tablet Take 15 mg by mouth every Friday. Take 2.5 mg (6 tablets) once weekly.    . nitroGLYCERIN (NITROSTAT) 0.4 MG SL tablet Place 1 tablet (0.4 mg total) under the tongue every 5 (five) minutes as needed for chest pain. 25 tablet 12  . prasugrel (EFFIENT) 10 MG TABS tablet Take 1 tablet (10 mg total) by mouth daily. 30 tablet 6    Past Medical History  Diagnosis Date  . CAD (coronary artery disease)     a. anterior ST elevation MI in 09/2015. Cardiac cath showed occluded prox LAD and 60% ostial RCA stenosis. There was also very distal disease affecting the LAD and ramus. He underwent successful angioplasty and drug-eluting stent placement to the LAD. Ejection fraction was  35-40% which improved subsequently an echo to 55-60%.  . Hyperlipidemia   . Tobacco use   . Ischemic cardiomyopathy     a. EF by cath 09/13/2015: 35-40%; b. 09/14/2015: EF 55-60%, Probable akinesis of  the midanteroseptal myocardium. akinesis of the apicalanterior myocardium, GR1DD  . MI (myocardial infarction) [facility])     Past Surgical History  Procedure Laterality Date  . Cardiac catheterization N/A 09/13/2015     Procedure: Left Heart Cath and Coronary Angiography;  Surgeon: Wellington Hampshire, MD;  Location: [place] [facility];  Service: Cardiovascular;  Laterality: N/A;  . Cardiac catheterization N/A 09/13/2015    Procedure: Coronary Stent Intervention;  Surgeon: Wellington Hampshire, MD;  Location: [place] [facility];  Service: Cardiovascular;  Laterality: N/A;    Family History  Problem Relation Age of Onset  . Cancer Other   . Heart Problems Mother   . Heart attack Father   . Heart disease Father     Social History Mr. Tash reports that he quit smoking about 7 weeks ago. His smoking use included Cigarettes. He quit after 40 years of use. He does not have any smokeless tobacco history on file. Mr. Boeve reports that he does not drink alcohol.  Review of Systems Complete review of systems negative except as otherwise outlined in the clinical summary and also the following. Generalized anxiety about his condition, dyspnea on exertion and feeling of unease at times. No definite orthopnea or PND. No reported bleeding episodes.  Physical Examination Temp:  [97.9 F (36.6 C)] 97.9 F (36.6 C) (01/30 1545) Pulse Rate:  [66-76] 76 (01/30 1650) Resp:  [14-25] 23 (01/30 1650) BP: (104-121)/(56-75) 121/66 mmHg (01/30 1650) SpO2:  [97 %-100 %] 98 % (01/30 1650) Weight:  [178 lb (80.74 kg)] 178 lb (80.74 kg) (01/30 1545) No intake or output data in the 24 hours ending 11/02/15 1703  Telemetry: Sinus rhythm.  Gen.: Patient in no acute distress, complains of mild residual chest discomfort. Somewhat anxious. HEENT: Conjunctiva and lids normal, oropharynx clear. Neck: Supple, no elevated JVP or carotid bruits, no thyromegaly. Lungs: Clear to auscultation, nonlabored breathing at rest. Cardiac: Regular rate and rhythm, no S3 or significant systolic murmur, no pericardial rub. Abdomen: Soft, nontender, bowel sounds present, no guarding or rebound. Extremities: No pitting edema, distal pulses  2+. Skin: Warm and dry. Musculoskeletal: No kyphosis. Neuropsychiatric: Alert and oriented x3, affect grossly appropriate.  Lab Results  Basic Metabolic Panel:  Recent Labs Lab 11/02/15 1555  NA 139  K 4.5  CL 106  CO2 26  GLUCOSE 120*  BUN 15  CREATININE 1.13  CALCIUM 9.5    CBC:  Recent Labs Lab 11/02/15 1555  WBC 7.0  HGB 15.1  HCT 44.8  MCV 89.4  PLT 218    Cardiac Enzymes:  Recent Labs Lab 11/02/15 1555  TROPONINI <0.03    ECG Tracing from 11/02/2015 shows sinus rhythm with evidence of recent anterior wall infarct and evolutionary changes compared to tracings from December 2016. Also nonspecific inferior ST segment changes with minor nondiagnostic elevation, similar to prior tracings as well.  Imaging and Recent Studies  Chest x-ray 11/02/2015: FINDINGS: Midline trachea. Normal heart size and mediastinal contours. No pleural effusion or pneumothorax. Right-sided pleural-parenchymal scarring with pleural calcifications are similar, especially within the lateral right mid chest. Clear left lung.  IMPRESSION: No acute cardiopulmonary disease.  Right-sided pleural parenchymal scarring, as before.  Echocardiogram 09/14/2015: Study Conclusions  - Left ventricle: The cavity size was normal.  There was mild focal basal hypertrophy of the septum. Systolic function was normal. The estimated ejection fraction was in the range of 55% to 60%. Probable akinesis of the apical myocardium. Probable akinesis of the midanteroseptal myocardium. There is akinesis of the apicalanterior myocardium. There was an increased relative contribution of atrial contraction to ventricular filling. Doppler parameters are consistent with abnormal left ventricular relaxation (grade 1 diastolic dysfunction). - Ventricular septum: Septal motion showed paradox.  Cardiac catheterization 09/13/2015:  Dist LAD lesion, 90% stenosed.  Ost RCA lesion, 50%  stenosed.  There is moderate left ventricular systolic dysfunction.  Dist Cx lesion, 70% stenosed.  Ramus lesion, 80% stenosed.  Prox LAD to Mid LAD lesion, 100% stenosed. Post intervention, there is a 0% residual stenosis.    The lesion was not previously treated.  1. Severe one-vessel coronary artery disease with thrombotic occlusion of the proximal LAD which is the culprit for presentation. There is also significant stenosis affecting the distal LAD close to the apex, very distal ramus and distal left circumflex . There is also moderate ostial RCA stenosis. 2. Moderately reduced LV systolic function with an ejection fraction of 35-40% with severe distal anterior wall and distal inferior wall hypokinesis. There is akinesis of the apex. 3. Mildly elevated left ventricular end-diastolic pressure. 4. Successful angioplasty and drug-eluting stent placement to the proximal LAD.  Impression  1. Symptoms concerning for unstable angina. Although he has had dyspnea on exertion and fatigue at least over the last few weeks, he reports that this has been getting worse and he developed chest squeezing today which reminded him of his symptoms back in December 2016, although less intense. There is somewhat of a pleuritic character to this but not entirely. He has had no cough or hemoptysis and is not hypoxic. Initial troponin I is negative and his ECG shows essentially evolutionary changes from his recent infarct in December 2016. He is hemodynamically stable at this time. Chest x-ray shows no acute process with old right sided pleural scarring.  2. Multivessel CAD status post anterior wall infarct in December 2016 managed with placement of DES to the LAD, the culprit lesion. He did have residual disease as outlined above which has been managed medically. When he saw Dr. Fletcher Anon just recently in the office for follow-up, an outpatient Myoview was arranged to assess ischemic burden on medical therapy.  3.  History of tobacco abuse, the patient quit smoking as of December 2016. He has had no relapses as yet.  4. Hyperlipidemia, reports compliance with Lipitor.  5. History of cardiomyopathy, LVEF was 35-40% at cardiac catheterization December 2016, however 55-60% by follow-up echocardiogram. He did have anteroseptal akinesis.  Recommendations  Reviewed records and discussed the symptoms with patient and his wife. In light of accelerating pattern and new onset chest discomfort today, we are arranging transfer to our cardiology [facility] at [facility] in the ICU. He is feeling better on IV nitroglycerin and heparin, will need a follow-up diagnostic cardiac catheterization to reassess coronary anatomy and stent patency. Would also recommend an echocardiogram to exclude any new structural abnormalities, although do not necessarily suspect mechanical complication since he was revascularized and is currently hemodynamically stable without substantial heart murmur. Continue baseline cardiac medical regimen.  Satira Sark, M.D., F.A.C.C.

## 2015-11-02 NOTE — Progress Notes (Signed)
ANTICOAGULATION CONSULT NOTE - Follow Up Consult  Pharmacy Consult for heparin Indication: USAP  Labs:  Recent Labs  11/02/15 1555 11/02/15 2131 11/02/15 2311  HGB 15.1  --   --   HCT 44.8  --   --   PLT 218  --   --   APTT 25  --   --   LABPROT 13.0 13.7  --   INR 0.96 1.03  --   HEPARINUNFRC  --   --  0.26*  CREATININE 1.13  --   --   TROPONINI <0.03  --   --      Assessment: 59yo male subtherapeutic on heparin with initial dosing for USAP.  Goal of Therapy:  Heparin level 0.3-0.7 units/ml   Plan:  Will increase heparin gtt by 2 units/kg/hr to 1200 units/hr and check level in 6hr.  Wynona Neat, PharmD, BCPS  11/02/2015,11:35 PM

## 2015-11-02 NOTE — ED Notes (Signed)
Pt reports left cp tightness since 1130 this morning, pt has had 4 nitros today.  Pt has had sob. Pt speaking in complete sentences. Had MI in December with stents placed.

## 2015-11-02 NOTE — Progress Notes (Signed)
Grant Cardenas is a 60 yo M w/ a PMH significant for CAD s/p STEMI 09/2015 and asymptomatic LV dysfxn with EF at LLN p/w CP. Per wife and pt has done well at home since STEMI. Stopped smoking. Heart healthy diet. Has been taking ASA and prasurgrel. Walking more than usual. Has been somewhat anxious and SOB. Today has CP started at 11:00 AM. Substernal pressure. Strong pleuritic component. Does not radiate to jaw or L arm. No diaphoresis. In these ways distinctly different from last time. Vitals stable. PEX unremarkable. EKG poor R wave progression but no acute ST segment/T wave changes. 1st trop neg.  Impression: UA (i.e in-stent restenosis vs. progression of other dz) vs. pericarditis vs. PE. Favor pericarditis based on Hx but would consider repeat LHC to document unchanged.  Recommendations: 1.) Admit to CCU/tele 2.) Serial EKG/CE 3.) Repeat TTE to assess LV fxn and look for pericardial thickening/effusion 4.) NPO after midnight, LHC in AM 5.) Continue heparin gtt, ASA, prasugrel 6.) Continue atorvastatin 7.) Continue carvedilol, consider adding ACEI in post-MI pt +/- LVSD  Jay Schlichter, MD Fellow, Cardiovascular Medicine

## 2015-11-02 NOTE — ED Notes (Signed)
Cardiologist at bedside.  

## 2015-11-03 ENCOUNTER — Ambulatory Visit (HOSPITAL_COMMUNITY)
Admission: RE | Admit: 2015-11-03 | Payer: BLUE CROSS/BLUE SHIELD | Source: Ambulatory Visit | Admitting: Interventional Cardiology

## 2015-11-03 ENCOUNTER — Telehealth: Payer: Self-pay

## 2015-11-03 ENCOUNTER — Inpatient Hospital Stay (HOSPITAL_COMMUNITY): Payer: BLUE CROSS/BLUE SHIELD

## 2015-11-03 ENCOUNTER — Encounter (HOSPITAL_COMMUNITY)
Admission: EM | Disposition: A | Discharge status: Home / Self Care | Payer: Self-pay | Source: Home / Self Care | Attending: Cardiology

## 2015-11-03 ENCOUNTER — Ambulatory Visit: Payer: BLUE CROSS/BLUE SHIELD | Admitting: Nurse Practitioner

## 2015-11-03 ENCOUNTER — Encounter (HOSPITAL_COMMUNITY): Payer: Self-pay | Admitting: Cardiology

## 2015-11-03 DIAGNOSIS — Z955 Presence of coronary angioplasty implant and graft: Secondary | ICD-10-CM

## 2015-11-03 DIAGNOSIS — Z87891 Personal history of nicotine dependence: Secondary | ICD-10-CM

## 2015-11-03 DIAGNOSIS — R079 Chest pain, unspecified: Secondary | ICD-10-CM | POA: Diagnosis present

## 2015-11-03 DIAGNOSIS — I252 Old myocardial infarction: Secondary | ICD-10-CM

## 2015-11-03 DIAGNOSIS — I2510 Atherosclerotic heart disease of native coronary artery without angina pectoris: Secondary | ICD-10-CM

## 2015-11-03 HISTORY — PX: CARDIAC CATHETERIZATION: SHX172

## 2015-11-03 LAB — MRSA PCR SCREENING: MRSA by PCR: NEGATIVE

## 2015-11-03 LAB — HEPARIN LEVEL (UNFRACTIONATED): Heparin Unfractionated: 0.28 IU/mL — ABNORMAL LOW (ref 0.30–0.70)

## 2015-11-03 SURGERY — LEFT HEART CATH AND CORONARY ANGIOGRAPHY

## 2015-11-03 MED ORDER — LIDOCAINE HCL (PF) 1 % IJ SOLN
INTRAMUSCULAR | Status: AC
  Filled 2015-11-03: qty 30

## 2015-11-03 MED ORDER — PANTOPRAZOLE SODIUM 40 MG PO TBEC: 40.0000 mg | DELAYED_RELEASE_TABLET | Freq: Every day | ORAL | Status: DC

## 2015-11-03 MED ORDER — MIDAZOLAM HCL 2 MG/2ML IJ SOLN
INTRAMUSCULAR | Status: DC | PRN
  Administered 2015-11-03: 2 mg via INTRAVENOUS

## 2015-11-03 MED ORDER — VERAPAMIL HCL 2.5 MG/ML IV SOLN
INTRAVENOUS | Status: AC
  Filled 2015-11-03: qty 2

## 2015-11-03 MED ORDER — MIDAZOLAM HCL 2 MG/2ML IJ SOLN
INTRAMUSCULAR | Status: AC
  Filled 2015-11-03: qty 2

## 2015-11-03 MED ORDER — HEPARIN SODIUM (PORCINE) 1000 UNIT/ML IJ SOLN
INTRAMUSCULAR | Status: AC
  Filled 2015-11-03: qty 1

## 2015-11-03 MED ORDER — FENTANYL CITRATE (PF) 100 MCG/2ML IJ SOLN
INTRAMUSCULAR | Status: DC | PRN
  Administered 2015-11-03: 25 ug via INTRAVENOUS

## 2015-11-03 MED ORDER — SODIUM CHLORIDE 0.9 % IV SOLN: 250.0000 mL | INTRAVENOUS | Status: DC | PRN

## 2015-11-03 MED ORDER — SODIUM CHLORIDE 0.9 % WEIGHT BASED INFUSION: 3.0000 mL/kg/h | INTRAVENOUS | Status: AC

## 2015-11-03 MED ORDER — LIDOCAINE HCL (PF) 1 % IJ SOLN
INTRAMUSCULAR | Status: DC | PRN
  Administered 2015-11-03: 12:00:00

## 2015-11-03 MED ORDER — SODIUM CHLORIDE 0.9% FLUSH: 3.0000 mL | INTRAVENOUS | Status: DC | PRN

## 2015-11-03 MED ORDER — HEPARIN SODIUM (PORCINE) 1000 UNIT/ML IJ SOLN
INTRAMUSCULAR | Status: DC | PRN
  Administered 2015-11-03: 4000 [IU] via INTRAVENOUS

## 2015-11-03 MED ORDER — IOHEXOL 350 MG/ML SOLN
INTRAVENOUS | Status: DC | PRN
  Administered 2015-11-03: 75 mL via INTRA_ARTERIAL

## 2015-11-03 MED ORDER — SODIUM CHLORIDE 0.9% FLUSH: 3.0000 mL | Freq: Two times a day (BID) | INTRAVENOUS | Status: DC

## 2015-11-03 MED ORDER — FENTANYL CITRATE (PF) 100 MCG/2ML IJ SOLN
INTRAMUSCULAR | Status: AC
  Filled 2015-11-03: qty 2

## 2015-11-03 MED ORDER — VERAPAMIL HCL 2.5 MG/ML IV SOLN
INTRAVENOUS | Status: DC | PRN
  Administered 2015-11-03: 12:00:00 via INTRA_ARTERIAL

## 2015-11-03 MED ORDER — HEPARIN (PORCINE) IN NACL 2-0.9 UNIT/ML-% IJ SOLN
INTRAMUSCULAR | Status: AC
  Filled 2015-11-03: qty 1500

## 2015-11-03 SURGICAL SUPPLY — 11 items

## 2015-11-03 NOTE — Progress Notes (Signed)
SUBJECTIVE:  Still having chest discomfort.  It is different than his prior MI pain.  That was more in the left arm.  This feels like heart burn in the center of his chest.  It is worse with certain movements and with a deep breath in.   OBJECTIVE:   Vitals:   Filed Vitals:   11/03/15 0600 11/03/15 0700 11/03/15 0800 11/03/15 0810  BP: 117/76 117/73 113/70 108/72  Pulse: 93 84 85 63  Temp:    97.9 F (36.6 C)  TempSrc:    Oral  Resp: 21 19 23 17   Height:      Weight:      SpO2: 98% 100% 99% 96%   I&O's:   Intake/Output Summary (Last 24 hours) at 11/03/15 0846 Last data filed at 11/03/15 0810  Gross per 24 hour  Intake 799.47 ml  Output    250 ml  Net 549.47 ml   TELEMETRY: Reviewed telemetry pt in NSR:     PHYSICAL EXAM General: Well developed, well nourished, in no acute distress Head:   Normal cephalic and atramatic  Lungs:   Clear bilaterally to auscultation. Heart:   HRRR S1 S2  No JVD.   Abdomen: abdomen soft and non-tender Msk:  Back normal,  Normal strength and tone for age. Extremities:   No edema.  2+ right radial pulse; scarred skin on legs Neuro: Alert and oriented. Psych:  Normal affect, responds appropriately Skin: No rash   LABS: Basic Metabolic Panel:  Recent Labs  11/02/15 1555  NA 139  K 4.5  CL 106  CO2 26  GLUCOSE 120*  BUN 15  CREATININE 1.13  CALCIUM 9.5   Liver Function Tests: No results for input(s): AST, ALT, ALKPHOS, BILITOT, PROT, ALBUMIN in the last 72 hours. No results for input(s): LIPASE, AMYLASE in the last 72 hours. CBC:  Recent Labs  11/02/15 1555  WBC 7.0  HGB 15.1  HCT 44.8  MCV 89.4  PLT 218   Cardiac Enzymes:  Recent Labs  11/02/15 1555  TROPONINI <0.03   BNP: Invalid input(s): POCBNP D-Dimer: No results for input(s): DDIMER in the last 72 hours. Hemoglobin A1C: No results for input(s): HGBA1C in the last 72 hours. Fasting Lipid Panel: No results for input(s): CHOL, HDL, LDLCALC, TRIG,  CHOLHDL, LDLDIRECT in the last 72 hours. Thyroid Function Tests: No results for input(s): TSH, T4TOTAL, T3FREE, THYROIDAB in the last 72 hours.  Invalid input(s): FREET3 Anemia Panel: No results for input(s): VITAMINB12, FOLATE, FERRITIN, TIBC, IRON, RETICCTPCT in the last 72 hours. Coag Panel:   Lab Results  Component Value Date   INR 1.03 11/02/2015   INR 0.96 11/02/2015    RADIOLOGY: Dg Chest 2 View  11/02/2015  CLINICAL DATA:  Left-sided chest tightness. Prior myocardial infarction. Tobacco use. Hyperlipidemia. EXAM: CHEST  2 VIEW COMPARISON:  10/29/2015.  CT 09/04/2003. FINDINGS: Midline trachea. Normal heart size and mediastinal contours. No pleural effusion or pneumothorax. Right-sided pleural-parenchymal scarring with pleural calcifications are similar, especially within the lateral right mid chest. Clear left lung. IMPRESSION: No acute cardiopulmonary disease. Right-sided pleural parenchymal scarring, as before. Electronically Signed   By: Abigail Miyamoto M.D.   On: 11/02/2015 16:43   Dg Chest 2 View  10/29/2015  CLINICAL DATA:  Shortness of Breath, coronary artery disease native coronary artery EXAM: CHEST  2 VIEW COMPARISON:  09/13/2015 FINDINGS: Cardiomediastinal silhouette is stable. No acute infiltrate or pleural effusion. No pulmonary edema. Stable calcified pleural plaque and pleural parenchymal scarring  right midlung peripheral. Stable pleural scarring right base laterally. Bony thorax is unremarkable. IMPRESSION: No active disease. Stable scarring in calcified pleural plaque right lung. Electronically Signed   By: Lahoma Crocker M.D.   On: 10/29/2015 13:47      ASSESSMENT: / PLAN:    1) Unstable angina.  Plan for cath later today.  Atypical features to some of his sx but known disease. Currently on IV NTG and heparin but this is not helping his pain fully.  Negative troponins thus far.  More than 6 weeks post MI, seems late for post MI pericarditis.  Could have costochondritis or  GERD.   Recently quit smoking.  Encourage him to continue to abstain.      [name], MD  11/03/2015  8:46 AM

## 2015-11-03 NOTE — Discharge Summary (Addendum)
Physician Discharge Summary       Patient ID: Grant Cardenas MRN: [id number] DOB/AGE: 11/08/1955 60 y.o.  Admit date: 11/02/2015 Discharge date: 11/03/2015 Primary Cardiologist: Dr. Fletcher Anon   Discharge Diagnoses:  Principal Problem:   Pain in the chest non cardiac, may be GI or Muscular skeletal.  Active Problems:   Coronary artery disease involving native coronary artery with angina pectoris [facility])   ST elevation (STEMI) myocardial infarction involving left anterior descending coronary artery ([place])  09/2015   Hyperlipidemia   Tobacco use   Cardiomyopathy, ischemic   Discharged Condition: good  Procedures: 11/03/15 cardiac cath by Dr. Martinique, stable cath.  Hospital Course:   60 y.o.male with past medical history outlined below, presenting to the [facility] ER 11/02/15 with recent, recurring chest discomfort described as a squeezing sensation (he uses a clenched fist to describe it).  His wife stated that he has not "felt right" since this past Friday. They had visitors, and she stated that he seemed restless, getting up and walking around more than normal. He stated that he has been more short of breath, also anxious, reports developing chest discomfort earlier in the morning which has waxed and waned throughout the day and was not responsive to sublingual nitroglycerin at home. In the ER he was been placed on IV nitroglycerin with improving symptoms although not complete resolution as yet. Heparin drip was also just been started. He does describe his symptoms in somewhat of a pleuritic character, but not entirely. He has had no cough, hemoptysis, or hypoxia.  At times with movement he had discomfort.  As noted below his cardiac history includes recent anterior wall infarct in December 2016. He was managed with DES to the LAD which was the culprit vessel, and had other residual lesions including moderate ostial RCA disease, distal LAD, ramus and circumflex disease that were managed  medically. Although initially showing evidence of cardiomyopathy, his LVEF improved to the range of 55-60% by follow-up echocardiogram with anteroseptal akinesis and grade 1 diastolic dysfunction.    Pt admitted placed on IV NTG and Heparin.  He had neg troponin but continued with episodic pain despite IV hep and NTG.  Underwent cardia cath with stable anatomy.  Echo was stable as well.  Dr. Irish Lack evaluated pt and found him stable for discharge. Will place on protonix for 1 month in case this is reflux and have asked him to take tylenol with muscular skeletal component.  He will follow up with Dr. Fletcher Anon.     He may resume cardiac rehab.  He will continue DAPT, statin BB.  With BP soft at times will decide as outpt if ARB or ACE can be tolerated.    Consults: cardiology  Significant Diagnostic Studies:  BMP Latest Ref Rng 11/02/2015 09/23/2015 09/15/2015  Glucose 65 - 99 mg/dL 120(H) 70 108(H)  BUN 6 - 20 mg/dL 15 15 15   Creatinine 0.61 - 1.24 mg/dL 1.13 1.20 1.27(H)  BUN/Creat Ratio 10 - 22 - 13 -  Sodium 135 - 145 mmol/L 139 137 138  Potassium 3.5 - 5.1 mmol/L 4.5 5.5(H) 3.9  Chloride 101 - 111 mmol/L 106 96 107  CO2 22 - 32 mmol/L 26 17(L) 24  Calcium 8.9 - 10.3 mg/dL 9.5 10.1 8.8(L)   CBC Latest Ref Rng 11/02/2015 09/14/2015 09/13/2015  WBC 4.0 - 10.5 K/uL 7.0 7.1 8.0  Hemoglobin 13.0 - 17.0 g/dL 15.1 15.6 16.1  Hematocrit 39.0 - 52.0 % 44.8 47.3 48.2  Platelets 150 - 400  K/uL 218 231 226   Troponin <0.03  CHEST 2 VIEW COMPARISON: 10/29/2015. CT 09/04/2003. FINDINGS: Midline trachea. Normal heart size and mediastinal contours. No pleural effusion or pneumothorax. Right-sided pleural-parenchymal scarring with pleural calcifications are similar, especially within the lateral right mid chest. Clear left lung. IMPRESSION: No acute cardiopulmonary disease.  Right-sided pleural parenchymal scarring, as before.     CARDIAC CATH:  Dist LAD lesion, 90% stenosed.  Ost  RCA lesion, 50% stenosed.  Dist Cx lesion, 70% stenosed.  Ramus lesion, 70% stenosed.  The left ventricular systolic function is normal.  1. Patent stent in the proximal LAD 2. Focal stenosis in the distal LAD 3. Moderate disease in the distal ramus intermediate, LCx and proximal RCA. Unchanged from prior study. 4. Low normal LV function- improved from prior study.  Plan: continue medical rx  ECHO:Study Conclusions - Left ventricle: The cavity size was normal. Systolic function was mildly reduced. The estimated ejection fraction was in the range of 45% to 50%. There is akinesis of the mid-apicalanteroseptal myocardium. Doppler parameters are consistent with abnormal left ventricular relaxation (grade 1 diastolic dysfunction).   Discharge Exam: Blood pressure 149/82, pulse 94, temperature 98 F (36.7 C), temperature source Oral, resp. rate 25, height 6\' 1"  (1.854 m), weight 178 lb 12.7 oz (81.1 kg), SpO2 95 %.  Disposition: 01-Home or Self Care     Medication List    TAKE these medications        aspirin EC 81 MG tablet  Take 1 tablet (81 mg total) by mouth daily.     atorvastatin 40 MG tablet  Commonly known as:  LIPITOR  Take 1 tablet (40 mg total) by mouth daily at 6 PM.     carvedilol 6.25 MG tablet  Commonly known as:  COREG  Take 1 tablet (6.25 mg total) by mouth 2 (two) times daily with a meal.     clobetasol ointment 0.05 %  Commonly known as:  TEMOVATE  Apply 1 application topically 2 (two) times daily.     folic acid 1 MG tablet  Commonly known as:  FOLVITE  Take 1 mg by mouth See admin instructions. Takes 1 tab daily except Fridays     ketoconazole 2 % cream  Commonly known as:  NIZORAL  Apply 1 application topically daily as needed for irritation.     ketoconazole 2 % shampoo  Commonly known as:  NIZORAL  Apply 1 application topically 2 (two) times a week.     methotrexate 2.5 MG tablet  Commonly known as:  RHEUMATREX  Take 15 mg by  mouth every Friday. Take 2.5 mg (6 tablets) once weekly.     nitroGLYCERIN 0.4 MG SL tablet  Commonly known as:  NITROSTAT  Place 1 tablet (0.4 mg total) under the tongue every 5 (five) minutes as needed for chest pain.     pantoprazole 40 MG tablet  Commonly known as:  PROTONIX  Take 1 tablet (40 mg total) by mouth daily.     prasugrel 10 MG Tabs tablet  Commonly known as:  EFFIENT  Take 1 tablet (10 mg total) by mouth daily.     triamcinolone ointment 0.1 %  Commonly known as:  KENALOG  APPLY TOPICALLY TWICE DAILY UNTIL RASH RESOLVED THEN PRN       Follow-up Information    Follow up with Kathlyn Sacramento, MD.   Specialty:  Cardiology   Why:  the office will call with date and time but before March.  Contact information:   [place] [address] [phone]        Discharge Instructions: Call [facility] at [phone] if any bleeding, swelling or drainage at cath site.  May shower, no tub baths for 48 hours for groin sticks. No lifting over 5 pounds for 3 days.  No Driving for 3 days  Take tylenol for this chest pain and we placed you on protonix to protect your stomach in case some reflux is causing pain.  Heart Healthy Diet.   Continue cardiac rehab.    Signed: Isaiah Serge Nurse Practitioner-Certified [facility]: [facility] 11/03/2015, 5:36 PM  Time spent on discharge : > 30 minutes.    I have examined the patient and reviewed assessment and plan and discussed with patient.  Agree with above as stated.  Discussed cath findings with the patient.  No significant change.  We spoke about noncardiac pain.  He reports that the pain "scared" him.  I reassured him that this is not uncommon after someone has an MI.  Right radial site is without hematoma.  Pulse is intact. He would like to follow up with Dr. Fletcher Anon. Discharge today.  He needs to avoid tobacco.    VARANASI,JAYADEEP S.

## 2015-11-03 NOTE — Progress Notes (Signed)
ANTICOAGULATION CONSULT NOTE - Follow Up Consult  Pharmacy Consult for heparin Indication: USAP  Labs:  Recent Labs  11/02/15 1555 11/02/15 2131 11/02/15 2311 11/03/15 0543  HGB 15.1  --   --   --   HCT 44.8  --   --   --   PLT 218  --   --   --   APTT 25  --   --   --   LABPROT 13.0 13.7  --   --   INR 0.96 1.03  --   --   HEPARINUNFRC  --   --  0.26* 0.28*  CREATININE 1.13  --   --   --   TROPONINI <0.03  --   --   --      Assessment: 59yo male subtherapeutic on heparin with initial dosing for USAP.  Goal of Therapy:  Heparin level 0.3-0.7 units/ml   Plan:  Will increase heparin gtt by 2 units/kg/hr to 1400 units/hr and follow up after cath today  Thank you Anette Guarneri, PharmD [phone]  11/03/2015,6:39 AM

## 2015-11-03 NOTE — Care Management Note (Signed)
Case Management Note  Patient Details  Name: Grant Cardenas MRN: [id number] Date of Birth: Dec 03, 1955  Subjective/Objective:     Adm w angina unstable               Action/Plan: lives w wife, pcp dr ed Luan Pulling and [facility]   Expected Discharge Date:                  Expected Discharge Plan:     In-House Referral:     Discharge planning Services     Post Acute Care Choice:    Choice offered to:     DME Arranged:    DME Agency:     HH Arranged:    [place] Agency:     Status of Service:     Medicare Important Message Given:    Date Medicare IM Given:    Medicare IM give by:    Date Additional Medicare IM Given:    Additional Medicare Important Message give by:     If discussed at [place] of Stay Meetings, dates discussed:    Additional Comments: alerted joey at [facility] of pt's adm. Wife had called but [facility] also called to let [facility] that [facility] at [facility].  Lacretia Leigh, RN 11/03/2015, 11:10 AM

## 2015-11-03 NOTE — Progress Notes (Signed)
Utilization review completed.  

## 2015-11-03 NOTE — Discharge Instructions (Signed)
Call [facility] at [phone] if any bleeding, swelling or drainage at cath site.  May shower, no tub baths for 48 hours for groin sticks. No lifting over 5 pounds for 3 days.  No Driving for 3 days  Take tylenol for this chest pain and we placed you on protonix to protect your stomach in case some reflux is causing pain.  Heart Healthy Diet.   Continue cardiac rehab.

## 2015-11-03 NOTE — Progress Notes (Signed)
TR BAND REMOVAL  LOCATION:    right radial  DEFLATED PER PROTOCOL:    Yes.    TIME BAND OFF / DRESSING APPLIED:    1545   SITE UPON ARRIVAL:    Level 0  SITE AFTER BAND REMOVAL:    Level 0  CIRCULATION SENSATION AND MOVEMENT:    Within Normal Limits   Yes.    COMMENTS:   Tolerated procedure well , post TRB instructions given

## 2015-11-03 NOTE — Telephone Encounter (Signed)
He had cardiac cath today which was fine. Cancel his stress test.              ----- Message -----     From: Peter M Martinique, MD     Sent: 11/03/2015 12:01 PM      To: [name], MD, Wellington Hampshire, MD    Stress test cancelled, Abby in [facility] notified. Left detailed message on pt home VM regarding cancelling stress test. Left CB number if questions.

## 2015-11-03 NOTE — H&P (View-Only) (Signed)
SUBJECTIVE:  Still having chest discomfort.  It is different than his prior MI pain.  That was more in the left arm.  This feels like heart burn in the center of his chest.  It is worse with certain movements and with a deep breath in.   OBJECTIVE:   Vitals:   Filed Vitals:   11/03/15 0600 11/03/15 0700 11/03/15 0800 11/03/15 0810  BP: 117/76 117/73 113/70 108/72  Pulse: 93 84 85 63  Temp:    97.9 F (36.6 C)  TempSrc:    Oral  Resp: 21 19 23 17   Height:      Weight:      SpO2: 98% 100% 99% 96%   I&O's:   Intake/Output Summary (Last 24 hours) at 11/03/15 0846 Last data filed at 11/03/15 0810  Gross per 24 hour  Intake 799.47 ml  Output    250 ml  Net 549.47 ml   TELEMETRY: Reviewed telemetry pt in NSR:     PHYSICAL EXAM General: Well developed, well nourished, in no acute distress Head:   Normal cephalic and atramatic  Lungs:   Clear bilaterally to auscultation. Heart:   HRRR S1 S2  No JVD.   Abdomen: abdomen soft and non-tender Msk:  Back normal,  Normal strength and tone for age. Extremities:   No edema.  2+ right radial pulse; scarred skin on legs Neuro: Alert and oriented. Psych:  Normal affect, responds appropriately Skin: No rash   LABS: Basic Metabolic Panel:  Recent Labs  11/02/15 1555  NA 139  K 4.5  CL 106  CO2 26  GLUCOSE 120*  BUN 15  CREATININE 1.13  CALCIUM 9.5   Liver Function Tests: No results for input(s): AST, ALT, ALKPHOS, BILITOT, PROT, ALBUMIN in the last 72 hours. No results for input(s): LIPASE, AMYLASE in the last 72 hours. CBC:  Recent Labs  11/02/15 1555  WBC 7.0  HGB 15.1  HCT 44.8  MCV 89.4  PLT 218   Cardiac Enzymes:  Recent Labs  11/02/15 1555  TROPONINI <0.03   BNP: Invalid input(s): POCBNP D-Dimer: No results for input(s): DDIMER in the last 72 hours. Hemoglobin A1C: No results for input(s): HGBA1C in the last 72 hours. Fasting Lipid Panel: No results for input(s): CHOL, HDL, LDLCALC, TRIG,  CHOLHDL, LDLDIRECT in the last 72 hours. Thyroid Function Tests: No results for input(s): TSH, T4TOTAL, T3FREE, THYROIDAB in the last 72 hours.  Invalid input(s): FREET3 Anemia Panel: No results for input(s): VITAMINB12, FOLATE, FERRITIN, TIBC, IRON, RETICCTPCT in the last 72 hours. Coag Panel:   Lab Results  Component Value Date   INR 1.03 11/02/2015   INR 0.96 11/02/2015    RADIOLOGY: Dg Chest 2 View  11/02/2015  CLINICAL DATA:  Left-sided chest tightness. Prior myocardial infarction. Tobacco use. Hyperlipidemia. EXAM: CHEST  2 VIEW COMPARISON:  10/29/2015.  CT 09/04/2003. FINDINGS: Midline trachea. Normal heart size and mediastinal contours. No pleural effusion or pneumothorax. Right-sided pleural-parenchymal scarring with pleural calcifications are similar, especially within the lateral right mid chest. Clear left lung. IMPRESSION: No acute cardiopulmonary disease. Right-sided pleural parenchymal scarring, as before. Electronically Signed   By: Abigail Miyamoto M.D.   On: 11/02/2015 16:43   Dg Chest 2 View  10/29/2015  CLINICAL DATA:  Shortness of Breath, coronary artery disease native coronary artery EXAM: CHEST  2 VIEW COMPARISON:  09/13/2015 FINDINGS: Cardiomediastinal silhouette is stable. No acute infiltrate or pleural effusion. No pulmonary edema. Stable calcified pleural plaque and pleural parenchymal scarring  right midlung peripheral. Stable pleural scarring right base laterally. Bony thorax is unremarkable. IMPRESSION: No active disease. Stable scarring in calcified pleural plaque right lung. Electronically Signed   By: Lahoma Crocker M.D.   On: 10/29/2015 13:47      ASSESSMENT: / PLAN:    1) Unstable angina.  Plan for cath later today.  Atypical features to some of his sx but known disease. Currently on IV NTG and heparin but this is not helping his pain fully.  Negative troponins thus far.  More than 6 weeks post MI, seems late for post MI pericarditis.  Could have costochondritis or  GERD.   Recently quit smoking.  Encourage him to continue to abstain.      [name], MD  11/03/2015  8:46 AM

## 2015-11-03 NOTE — Plan of Care (Signed)
Problem: Consults Goal: Tobacco Cessation referral if indicated Outcome: Completed/Met Date Met:  11/03/15 Pt quit smoking 09/12/16 Goal: Nutrition Consult-if indicated Outcome: Not Applicable Date Met:  35/46/56 Pt following cardiac diet regimen at home since STEMI in December 2016. Goal: Diabetes Guidelines if Diabetic/Glucose > 140 If diabetic or lab glucose is > 140 mg/dl - Initiate Diabetes/Hyperglycemia Guidelines & Document Interventions  Outcome: Not Applicable Date Met:  81/27/51 Non-diabetic.

## 2015-11-03 NOTE — Progress Notes (Signed)
Echocardiogram 2D Echocardiogram has been performed.  Grant Cardenas 11/03/2015, 12:10 PM

## 2015-11-03 NOTE — Interval H&P Note (Signed)
History and Physical Interval Note:  11/03/2015 11:19 AM  Grant Cardenas  has presented today for surgery, with the diagnosis of cp  The various methods of treatment have been discussed with the patient and family. After consideration of risks, benefits and other options for treatment, the patient has consented to  Procedure(s): Left Heart Cath and Coronary Angiography (N/A) as a surgical intervention .  The patient's history has been reviewed, patient examined, no change in status, stable for surgery.  I have reviewed the patient's chart and labs.  Questions were answered to the patient's satisfaction.   Cath Lab Visit (complete for each Cath Lab visit)  Clinical Evaluation Leading to the Procedure:   ACS: Yes.    Non-ACS:    Anginal Classification: CCS III  Anti-ischemic medical therapy: Minimal Therapy (1 class of medications)  Non-Invasive Test Results: No non-invasive testing performed  Prior CABG: No previous CABG        Collier [facility] 11/03/2015 11:20 AM

## 2015-11-04 ENCOUNTER — Telehealth: Payer: Self-pay | Admitting: *Deleted

## 2015-11-04 NOTE — Telephone Encounter (Signed)
Pt wife calling asking if we have completed the letter for patient not being able to work.  Would like [place] to send them a copy and then to the Senator's office  She states she gave the nurse sharon all that info last time they were in the office. Please advise

## 2015-11-05 ENCOUNTER — Encounter (HOSPITAL_COMMUNITY): Payer: Non-veteran care

## 2015-11-05 ENCOUNTER — Telehealth: Payer: Self-pay

## 2015-11-05 NOTE — Telephone Encounter (Signed)
Notified pt that letter has been faxed to Barry Dienes, [phone] and copy mailed to pt. Pt verbalized understanding.

## 2015-11-05 NOTE — Telephone Encounter (Signed)
Pt wife called, inquiring about pt disability papers. States [employer] has mailed the paper back to [place] for Dr. To sign. She also asks about a letter to be written to the senator. Please call and advise. She also wanted to let us know pt went back into the hospital on Monday.

## 2015-11-18 ENCOUNTER — Ambulatory Visit (INDEPENDENT_AMBULATORY_CARE_PROVIDER_SITE_OTHER): Payer: No Typology Code available for payment source | Admitting: Physician Assistant

## 2015-11-18 ENCOUNTER — Encounter: Payer: Self-pay | Admitting: Physician Assistant

## 2015-11-18 VITALS — BP 102/60 | HR 65 | Ht 73.0 in | Wt 184.8 lb

## 2015-11-18 DIAGNOSIS — I25119 Atherosclerotic heart disease of native coronary artery with unspecified angina pectoris: Secondary | ICD-10-CM

## 2015-11-18 DIAGNOSIS — R0602 Shortness of breath: Secondary | ICD-10-CM | POA: Diagnosis not present

## 2015-11-18 DIAGNOSIS — E785 Hyperlipidemia, unspecified: Secondary | ICD-10-CM

## 2015-11-18 DIAGNOSIS — I255 Ischemic cardiomyopathy: Secondary | ICD-10-CM

## 2015-11-18 NOTE — Patient Instructions (Addendum)
Medication Instructions:  Your physician recommends that you continue on your current medications as directed. Please refer to the Current Medication list given to you today.   Labwork: none  Testing/Procedures: Your physician has requested that you have a lexiscan myoview. For further information please visit [email or web address]. Please follow instruction sheet, as given.  [place]  Your caregiver has ordered a Stress Test with nuclear imaging. The purpose of this test is to evaluate the blood supply to your heart muscle. This procedure is referred to as a "Non-Invasive Stress Test." This is because other than having an IV started in your vein, nothing is inserted or "invades" your body. Cardiac stress tests are done to find areas of poor blood flow to the heart by determining the extent of coronary artery disease (CAD). Some patients exercise on a treadmill, which naturally increases the blood flow to your heart, while others who are  unable to walk on a treadmill due to physical limitations have a pharmacologic/chemical stress agent called Lexiscan . This medicine will mimic walking on a treadmill by temporarily increasing your coronary blood flow.   Please note: these test may take anywhere between 2-4 hours to complete  Please go to the main entrance at [facility] to check in.    Date of Procedure:________Feb 22______________  Arrival Time for Procedure:____8am___________________  Instructions regarding medication:    __xx__:  Hold coreg the night before procedure and morning of procedure      How to prepare for your Myoview test:   Do not eat or drink after midnight  No caffeine for 24 hours prior to test  No smoking 24 hours prior to test.  Your medication may be taken with water.  If your doctor stopped a medication because of this test, do not take that medication.  Please wear a short sleeve shirt.  No perfume, cologne or lotion. Wear  comfortable walking shoes.            Follow-Up: Your physician recommends that you schedule a follow-up appointment with Dr. Fletcher Anon after your lexi myoview.   Any Other Special Instructions Will Be Listed Below (If Applicable).     If you need a refill on your cardiac medications before your next appointment, please call your pharmacy.  Cardiac Nuclear Scanning A cardiac nuclear scan is used to check your heart for problems, such as the following:  A portion of the heart is not getting enough blood.  Part of the heart muscle has died, which happens with a heart attack.  The heart wall is not working normally.  In this test, a radioactive dye (tracer) is injected into your bloodstream. After the tracer has traveled to your heart, a scanning device is used to measure how much of the tracer is absorbed by or distributed to various areas of your heart. LET [facility] CARE PROVIDER KNOW ABOUT:  Any allergies you have.  All medicines you are taking, including vitamins, herbs, eye drops, creams, and over-the-counter medicines.  Previous problems you or members of your family have had with the use of anesthetics.  Any blood disorders you have.  Previous surgeries you have had.  Medical conditions you have.  RISKS AND COMPLICATIONS Generally, this is a safe procedure. However, as with any procedure, problems can occur. Possible problems include:   Serious chest pain.  Rapid heartbeat.  Sensation of warmth in your chest. This usually passes quickly. BEFORE THE PROCEDURE Ask your health care provider about changing or stopping your  regular medicines. PROCEDURE This procedure is usually done at a hospital and takes 2-4 hours.  An IV tube is inserted into one of your veins.  Your health care provider will inject a small amount of radioactive tracer through the tube.  You will then wait for 20-40 minutes while the tracer travels through your bloodstream.  You will lie  down on an exam table so images of your heart can be taken. Images will be taken for about 15-20 minutes.  You will exercise on a treadmill or stationary bike. While you exercise, your heart activity will be monitored with an electrocardiogram (ECG), and your blood pressure will be checked.  If you are unable to exercise, you may be given a medicine to make your heart beat faster.  When blood flow to your heart has peaked, tracer will again be injected through the IV tube.  After 20-40 minutes, you will get back on the exam table and have more images taken of your heart.  When the procedure is over, your IV tube will be removed. AFTER THE PROCEDURE  You will likely be able to leave shortly after the test. Unless your health care provider tells you otherwise, you may return to your normal schedule, including diet, activities, and medicines.  Make sure you find out how and when you will get your test results.   This information is not intended to replace advice given to you by your health care provider. Make sure you discuss any questions you have with your health care provider.   Document Released: 10/14/2004 Document Revised: 09/24/2013 Document Reviewed: 08/28/2013 Elsevier Interactive Patient Education [employer].

## 2015-11-18 NOTE — Progress Notes (Signed)
Cardiology Office Note Date:  11/18/2015  Patient ID:  Grant Cardenas, Grant Cardenas 1956-04-01, MRN [id number] PCP:  Alonza Bogus, MD  Cardiologist:  Dr. Fletcher Anon, MD    Chief Complaint: Hospital follow up  History of Present Illness: Grant Cardenas is a 60 y.o. male with history of CAD with history of anterior ST elevation MI in 09/2015 s/p PCI/DES to the LAD and residual disease as described as below, chronic systolic CHF/ICM, HLD, and tobacco abuse who presents for hospital follow up from recent admission to [facility] for chest pain. He presented in December 2016 with anterior ST elevation myocardial infarction. Cardiac catheterization showed occluded proximal LAD and 60% ostial RCA stenosis. There was also very distal disease affecting the LAD and ramus. He underwent successful angioplasty and drug-eluting stent placement to the LAD. Ejection fraction was 35-40% which improved subsequently on echo to 55-60% with anteroseptal AK and GR1DD. He was noted to be dizzy and hypotensive at his visit with Dr. Fletcher Anon in late [place]. Thus, his lisinopril was discontinued. He had not had any further chest pain at that time, though did ntoe continued dyspnea thus outpatient nuclear stress testing was recommended. He was admitted to [facility] on 11/02/15 with unstable angina before the above stress testing could be completed. Cardiac enzyme was negative x 1. He noted increaed SOB and was more anxious. Symptoms were somewhat pleuritic. He underwent diagnostic LHC on 1/31 which showed patent stent along the proximal LAD, distal LAD stenosis of 90%, 70% distal LCx stenosed, ostial RCA 50% stenosed, and ramus 70% stenosed. LVSF was normal. This was stable compared to prior cardiac cath. He was continued on medical therapy. Echo on 1/31 showed an EF of 45-50%, mid-apicalanteroseptal akinesis and GR1DD.   He has noted intermittent fatigue since his discharge. This is unchanged from prior to his admission. He reports this has been  ongoing since his MI. Not worse and not better. Some days he can go about he daily activities without any issues at all and some others he struggles to get out of bed. He reports he really wants a "whopper and a cigarette." He denies any further chest pain. He has not missed any doses of his Effient or aspirin. Blood pressure has been well controlled.     Past Medical History  Diagnosis Date  . CAD (coronary artery disease)     a. anterior ST elevation MI in 09/2015. Cardiac cath showed occluded prox LAD and 60% ostial RCA stenosis. There was also very distal disease affecting the LAD and ramus. He underwent successful angioplasty and drug-eluting stent placement to the LAD. Ejection fraction was 35-40% which improved subsequently an echo to 55-60%.  . Hyperlipidemia   . Tobacco use   . Ischemic cardiomyopathy     a. EF by cath 09/13/2015: 35-40%; b. 09/14/2015: EF 55-60%, Probable akinesis of  the midanteroseptal myocardium. akinesis of the apicalanterior myocardium, GR1DD  . MI (myocardial infarction) [facility])     Past Surgical History  Procedure Laterality Date  . Cardiac catheterization N/A 09/13/2015    Procedure: Left Heart Cath and Coronary Angiography;  Surgeon: Wellington Hampshire, MD;  Location: [place] [facility];  Service: Cardiovascular;  Laterality: N/A;  . Cardiac catheterization N/A 09/13/2015    Procedure: Coronary Stent Intervention;  Surgeon: Wellington Hampshire, MD;  Location: [place] [facility];  Service: Cardiovascular;  Laterality: N/A;  . Cardiac catheterization N/A 11/03/2015    Procedure: Left Heart Cath and Coronary Angiography;  Surgeon: Ander Slade  Martinique, MD;  Location: [place] [facility];  Service: Cardiovascular;  Laterality: N/A;    Current Outpatient Prescriptions  Medication Sig Dispense Refill  . aspirin EC 81 MG tablet Take 1 tablet (81 mg total) by mouth daily. 90 tablet 3  . atorvastatin (LIPITOR) 40 MG tablet Take 1 tablet (40 mg total) by mouth daily at 6 PM. 30  tablet 6  . carvedilol (COREG) 6.25 MG tablet Take 1 tablet (6.25 mg total) by mouth 2 (two) times daily with a meal. 30 tablet 1  . clobetasol ointment (TEMOVATE) AB-123456789 % Apply 1 application topically 2 (two) times daily.   1  . folic acid (FOLVITE) 1 MG tablet Take 1 mg by mouth See admin instructions. Takes 1 tab daily except Fridays  3  . ketoconazole (NIZORAL) 2 % cream Apply 1 application topically daily as needed for irritation.     Marland Kitchen ketoconazole (NIZORAL) 2 % shampoo Apply 1 application topically 2 (two) times a week.   5  . nitroGLYCERIN (NITROSTAT) 0.4 MG SL tablet Place 1 tablet (0.4 mg total) under the tongue every 5 (five) minutes as needed for chest pain. 25 tablet 12  . prasugrel (EFFIENT) 10 MG TABS tablet Take 1 tablet (10 mg total) by mouth daily. 30 tablet 6  . triamcinolone ointment (KENALOG) 0.1 % APPLY TOPICALLY TWICE DAILY UNTIL RASH RESOLVED THEN PRN  3   No current facility-administered medications for this visit.    Allergies:   Review of patient's allergies indicates no known allergies.   Social History:  The patient  reports that he quit smoking about 2 months ago. His smoking use included Cigarettes. He quit after 40 years of use. He does not have any smokeless tobacco history on file. He reports that he does not drink alcohol or use illicit drugs.   Family History:  The patient's family history includes Cancer in his other; Heart Problems in his mother; Heart attack in his father; Heart disease in his father.  ROS:   Review of Systems  Constitutional: Positive for malaise/fatigue. Negative for fever, chills, weight loss and diaphoresis.  HENT: Negative for congestion.   Eyes: Negative for discharge and redness.  Respiratory: Positive for shortness of breath. Negative for cough, hemoptysis, sputum production and wheezing.   Cardiovascular: Negative for chest pain, palpitations, orthopnea, claudication, leg swelling and PND.  Gastrointestinal: Negative for  heartburn, nausea, vomiting, abdominal pain, blood in stool and melena.  Musculoskeletal: Negative for myalgias and falls.  Skin: Negative for rash.  Neurological: Positive for weakness. Negative for dizziness, tingling, tremors, sensory change, speech change, focal weakness and loss of consciousness.  Endo/Heme/Allergies: Does not bruise/bleed easily.  Psychiatric/Behavioral: Negative for substance abuse. The patient is nervous/anxious.   All other systems reviewed and are negative.     PHYSICAL EXAM:  VS:  BP 102/60 mmHg  Pulse 65  Ht 6\' 1"  (1.854 m)  Wt 184 lb 12 oz (83.802 kg)  BMI 24.38 kg/m2 BMI: Body mass index is 24.38 kg/(m^2). Well nourished, well developed, in no acute distress, nervous at times, pacing HEENT: normocephalic, atraumatic Neck: no JVD, carotid bruits or masses Cardiac:  normal S1, S2; RRR; no murmurs, rubs, or gallops Lungs:  clear to auscultation bilaterally, no wheezing, rhonchi or rales Abd: soft, nontender, no hepatomegaly, + BS MS: no deformity or atrophy Ext: no edema, cath site without bleeding, bruising, swelling, TTP, or bruit Skin: warm and dry, no rash Neuro:  moves all extremities spontaneously, no focal abnormalities noted,  follows commands Psych: euthymic mood, full affect   EKG:  Was ordered today. Shows NSR, 65 bpm, anteroseptal infarct, lateral TWI  Recent Labs: 09/14/2015: B Natriuretic Peptide 77.1 09/23/2015: ALT 67* 11/02/2015: BUN 15; Creatinine, Ser 1.13; Hemoglobin 15.1; Platelets 218; Potassium 4.5; Sodium 139  09/14/2015: VLDL 23 09/23/2015: Chol/HDL Ratio 5.2*; Cholesterol, Total 212*; HDL 41; LDL Calculated 137*; Triglycerides 171*   Estimated Creatinine Clearance: 77.6 mL/min (by C-G formula based on Cr of 1.13).   Wt Readings from Last 3 Encounters:  11/18/15 184 lb 12 oz (83.802 kg)  11/03/15 178 lb 12.7 oz (81.1 kg)  10/29/15 181 lb 4 oz (82.214 kg)     Other studies reviewed: Additional studies/records reviewed  today include: summarized above  ASSESSMENT AND PLAN:  1. CAD s/p PCI as above: Most recent cardiac cath end of January 2017 was unchanged from prior cath in 09/2015 with medical management being advised. He has continued to note intermittent fatigue on some days, though some days he is able to go about his daily activities without any issues whatsoever. He does have new lateral TWI on ECG today though no new symptoms as his fatigue has been ongoing since his MI. I will schedule him for nuclear stress to evaluate for clinical significance of his residual coronary artery disease. This will be done at [facility] per his request. Continue DAPT with aspirin 81 mg and Effient 10 mg daily. Coreg 6.25 mg bid, SL NTG prn.  2. Ischemic cardiomyopathy: He does not appear to be volume overloaded today on exam. Continue Coreg as above. His BP is slightly soft today at [id number] systolic which precludes further titration. Limit salt and po fluid intake. No whopper meals from [employer].   3. HLD: Continue Lipitor 40 mg daily.   4. Prior tobacco abuse: He stopped at the time of his MI. He continues to deal with cravings. Perhaps his PCP could help with this.   Disposition: F/u with Dr. Fletcher Anon in 2-3 weeks  Current medicines are reviewed at length with the patient today.  The patient did not have any concerns regarding medicines.  Melvern Banker PA-C 11/18/2015 2:56 PM     [address] [place], [place] [address] [phone]

## 2015-11-20 ENCOUNTER — Other Ambulatory Visit: Payer: Self-pay

## 2015-11-20 ENCOUNTER — Telehealth: Payer: Self-pay | Admitting: Cardiovascular Disease

## 2015-11-20 DIAGNOSIS — R0602 Shortness of breath: Secondary | ICD-10-CM

## 2015-11-20 NOTE — Telephone Encounter (Signed)
Left detailed message on pt VM regarding new lexi appt. Monday, Feb 20, 9am, 8:45am arrival. Requested CB as phone note states pt having difficulty breathing.

## 2015-11-20 NOTE — Telephone Encounter (Signed)
S/w pt wife, Vickii Chafe, who called back and states they are agreeable w/reschedule date of Feb 20 for lexi myoview at [facility]. States he has exertional dyspnea which is not new for him. She does not have a pulse ox therefore, she is unable to provide oxygen level. She states she will purchase one to monitor o2 level at home. She understands if sx worsen, he needs to be seen emergently.  Peggy had no further questions.

## 2015-11-20 NOTE — Telephone Encounter (Signed)
Patient wife said  called and says the Josem Kaufmann is not good for them.  Patient wants to schedule here at [facility] for nm treadmill.  Please call to schedule and cancel the one at [facility] .  Please call Peggy when r/s .  Per wife patient is not doing well and is still having a hard time breathing.   Patient wife thinks maybe patient needs to be admitted by [place] as he is sob and it would be quicker to get nm study done.  Please call to discuss.

## 2015-11-23 ENCOUNTER — Ambulatory Visit
Admission: RE | Admit: 2015-11-23 | Discharge: 2015-11-23 | Disposition: A | Discharge status: Home / Self Care | Payer: No Typology Code available for payment source | Source: Ambulatory Visit | Attending: Physician Assistant | Admitting: Physician Assistant

## 2015-11-23 ENCOUNTER — Ambulatory Visit: Payer: BLUE CROSS/BLUE SHIELD | Admitting: Physician Assistant

## 2015-11-23 DIAGNOSIS — R0602 Shortness of breath: Secondary | ICD-10-CM | POA: Insufficient documentation

## 2015-11-23 DIAGNOSIS — I5189 Other ill-defined heart diseases: Secondary | ICD-10-CM | POA: Insufficient documentation

## 2015-11-23 DIAGNOSIS — I255 Ischemic cardiomyopathy: Secondary | ICD-10-CM | POA: Insufficient documentation

## 2015-11-23 LAB — NM MYOCAR MULTI W/SPECT W/WALL MOTION / EF
Estimated workload: 1 METS
Exercise duration (min): 0 min
Exercise duration (sec): 0 s
LV dias vol: 110 mL
LV sys vol: 57 mL
MPHR: 159 {beats}/min
Peak HR: 91 {beats}/min
Percent HR: 57 %
Percent of predicted max HR: 57 %
Rest HR: 56 {beats}/min
SDS: 1
SRS: 15
SSS: 8
Stage 1 HR: 68 {beats}/min
Stage 2 Grade: 0 %
Stage 2 HR: 58 {beats}/min
Stage 2 Speed: 0 mph
Stage 3 Grade: 0 %
Stage 3 HR: 58 {beats}/min
Stage 3 Speed: 0 mph
Stage 4 Grade: 0 %
Stage 4 HR: 91 {beats}/min
Stage 4 Speed: 0 mph
Stage 5 DBP: 63 mmHg
Stage 5 Grade: 0 %
Stage 5 HR: 85 {beats}/min
Stage 5 SBP: 112 mmHg
Stage 5 Speed: 0 mph
Stage 6 DBP: 68 mmHg
Stage 6 Grade: 0 %
Stage 6 HR: 76 {beats}/min
Stage 6 SBP: 104 mmHg
Stage 6 Speed: 0 mph
TID: 1.06

## 2015-11-23 MED ORDER — REGADENOSON 0.4 MG/5ML IV SOLN
0.4000 mg | Freq: Once | INTRAVENOUS | Status: AC
  Administered 2015-11-23: 0.4 mg via INTRAVENOUS

## 2015-11-23 MED ORDER — TECHNETIUM TC 99M SESTAMIBI - CARDIOLITE
12.8100 | Freq: Once | INTRAVENOUS | Status: AC | PRN
  Administered 2015-11-23: 12.81 via INTRAVENOUS

## 2015-11-23 MED ORDER — TECHNETIUM TC 99M SESTAMIBI - CARDIOLITE
29.9200 | Freq: Once | INTRAVENOUS | Status: AC | PRN
  Administered 2015-11-23: 10:00:00 29.92 via INTRAVENOUS

## 2015-11-24 ENCOUNTER — Encounter (HOSPITAL_COMMUNITY): Payer: BLUE CROSS/BLUE SHIELD

## 2015-11-24 ENCOUNTER — Other Ambulatory Visit: Payer: Self-pay

## 2015-11-24 MED ORDER — RANOLAZINE ER 500 MG PO TB12: 500.0000 mg | ORAL_TABLET | Freq: Two times a day (BID) | ORAL | Status: DC

## 2015-11-25 ENCOUNTER — Encounter (HOSPITAL_COMMUNITY): Payer: Non-veteran care

## 2015-11-26 ENCOUNTER — Telehealth: Payer: Self-pay

## 2015-11-26 ENCOUNTER — Other Ambulatory Visit (INDEPENDENT_AMBULATORY_CARE_PROVIDER_SITE_OTHER): Payer: BLUE CROSS/BLUE SHIELD

## 2015-11-26 ENCOUNTER — Encounter: Payer: Self-pay | Admitting: Internal Medicine

## 2015-11-26 ENCOUNTER — Other Ambulatory Visit
Admission: RE | Admit: 2015-11-26 | Discharge: 2015-11-26 | Disposition: A | Discharge status: Home / Self Care | Payer: No Typology Code available for payment source | Source: Ambulatory Visit | Attending: Cardiovascular Disease | Admitting: Cardiovascular Disease

## 2015-11-26 DIAGNOSIS — E785 Hyperlipidemia, unspecified: Secondary | ICD-10-CM

## 2015-11-26 DIAGNOSIS — Z029 Encounter for administrative examinations, unspecified: Secondary | ICD-10-CM | POA: Diagnosis present

## 2015-11-26 LAB — LIPID PANEL
Cholesterol: 153 mg/dL (ref 0–200)
HDL: 41 mg/dL (ref >40)
LDL Cholesterol: 95 mg/dL (ref 0–99)
Total CHOL/HDL Ratio: 3.7 RATIO
Triglycerides: 85 mg/dL (ref <150)
VLDL: 17 mg/dL (ref 0–40)

## 2015-11-26 LAB — HEPATIC FUNCTION PANEL
ALT: 90 U/L — ABNORMAL HIGH (ref 17–63)
AST: 40 U/L (ref 15–41)
Albumin: 4.6 g/dL (ref 3.5–5.0)
Alkaline Phosphatase: 68 U/L (ref 38–126)
Bilirubin, Direct: 0.2 mg/dL (ref 0.1–0.5)
Indirect Bilirubin: 1.3 mg/dL — ABNORMAL HIGH (ref 0.3–0.9)
Total Bilirubin: 1.5 mg/dL — ABNORMAL HIGH (ref 0.3–1.2)
Total Protein: 7.5 g/dL (ref 6.5–8.1)

## 2015-11-26 NOTE — Telephone Encounter (Signed)
[facility] CALLED TO SCHEDULE PATIENT FOR TCS   [phone] [place]   WILL BE THERE UNTIL 5

## 2015-11-26 NOTE — Telephone Encounter (Signed)
Please call them and have them fax the request and I will be glad to follow up.

## 2015-11-30 ENCOUNTER — Encounter: Payer: Self-pay | Admitting: Cardiovascular Disease

## 2015-11-30 ENCOUNTER — Ambulatory Visit (INDEPENDENT_AMBULATORY_CARE_PROVIDER_SITE_OTHER): Payer: BLUE CROSS/BLUE SHIELD | Admitting: Cardiovascular Disease

## 2015-11-30 VITALS — BP 112/62 | HR 62 | Ht 73.0 in | Wt 184.8 lb

## 2015-11-30 DIAGNOSIS — I25119 Atherosclerotic heart disease of native coronary artery with unspecified angina pectoris: Secondary | ICD-10-CM | POA: Diagnosis not present

## 2015-11-30 DIAGNOSIS — E785 Hyperlipidemia, unspecified: Secondary | ICD-10-CM

## 2015-11-30 MED ORDER — PANTOPRAZOLE SODIUM 40 MG PO TBEC: 40.0000 mg | DELAYED_RELEASE_TABLET | Freq: Every day | ORAL | Status: DC

## 2015-11-30 NOTE — Progress Notes (Signed)
Cardiology Office Note   Date:  11/30/2015   ID:  SALEM Cardenas, DOB [birth year], MRN [id number]  PCP:  Alonza Bogus, MD  Cardiologist:   Kathlyn Sacramento, MD   Chief Complaint  Patient presents with  . other    Follow up from [facility].  Pt.c/o shortness of breath.      History of Present Illness: Grant Cardenas is a 60 y.o. male who presents for  a follow-up visit regarding coronary artery disease and ischemic cardiomyopathy.  He presented in December 2016 with anterior ST elevation myocardial infarction. Cardiac catheterization showed occluded proximal LAD and 60% ostial RCA stenosis. There was also very distal disease affecting the LAD and ramus. He underwent successful angioplasty and drug-eluting stent placement to the LAD. Ejection fraction was 35-40% which improved subsequently on echo to normal. He had suffered from recurrent chest pain and anxiety since then. He was hospitalized in January for chest pain and underwent cardiac catheterization which showed patent proximal LAD stent with unchanged disease. Ejection fraction was 50%. He had more symptoms since then and was seen by Thurmond Butts. A pharmacologic nuclear stress test showed evidence of prior anterior infarct with apical ischemia. Ejection fraction was 45-55%. He was prescribed Ranexa but he actually has not started taking the medication as he reports no further episodes of chest pain. He complains of heartburn and frequent belching. He continues to be upset because his wife doesn't let him smoke or eat junk food.   Past Medical History  Diagnosis Date  . CAD (coronary artery disease)     a. anterior ST elevation MI in 09/2015. Cardiac cath showed occluded prox LAD and 60% ostial RCA stenosis. There was also very distal disease affecting the LAD and ramus. He underwent successful angioplasty and drug-eluting stent placement to the LAD. Ejection fraction was 35-40% which improved subsequently an echo to 55-60%.  .  Hyperlipidemia   . Tobacco use   . Ischemic cardiomyopathy     a. EF by cath 09/13/2015: 35-40%; b. 09/14/2015: EF 55-60%, Probable akinesis of  the midanteroseptal myocardium. akinesis of the apicalanterior myocardium, GR1DD  . MI (myocardial infarction) [facility])     Past Surgical History  Procedure Laterality Date  . Cardiac catheterization N/A 09/13/2015    Procedure: Left Heart Cath and Coronary Angiography;  Surgeon: Wellington Hampshire, MD;  Location: [place] [facility];  Service: Cardiovascular;  Laterality: N/A;  . Cardiac catheterization N/A 09/13/2015    Procedure: Coronary Stent Intervention;  Surgeon: Wellington Hampshire, MD;  Location: [place] [facility];  Service: Cardiovascular;  Laterality: N/A;  . Cardiac catheterization N/A 11/03/2015    Procedure: Left Heart Cath and Coronary Angiography;  Surgeon: Peter M Martinique, MD;  Location: [place] [facility];  Service: Cardiovascular;  Laterality: N/A;     Current Outpatient Prescriptions  Medication Sig Dispense Refill  . aspirin EC 81 MG tablet Take 1 tablet (81 mg total) by mouth daily. 90 tablet 3  . atorvastatin (LIPITOR) 40 MG tablet Take 1 tablet (40 mg total) by mouth daily at 6 PM. 30 tablet 6  . carvedilol (COREG) 6.25 MG tablet Take 1 tablet (6.25 mg total) by mouth 2 (two) times daily with a meal. 30 tablet 1  . clobetasol ointment (TEMOVATE) AB-123456789 % Apply 1 application topically 2 (two) times daily.   1  . folic acid (FOLVITE) 1 MG tablet Take 1 mg by mouth See admin instructions. Takes 1 tab daily except Fridays  3  .  ketoconazole (NIZORAL) 2 % cream Apply 1 application topically daily as needed for irritation.     . nitroGLYCERIN (NITROSTAT) 0.4 MG SL tablet Place 1 tablet (0.4 mg total) under the tongue every 5 (five) minutes as needed for chest pain. 25 tablet 12  . prasugrel (EFFIENT) 10 MG TABS tablet Take 1 tablet (10 mg total) by mouth daily. 30 tablet 6  . pantoprazole (PROTONIX) 40 MG tablet Take 1 tablet (40 mg total)  by mouth daily. 30 tablet 5  . ranolazine (RANEXA) 500 MG 12 hr tablet Take 1 tablet (500 mg total) by mouth 2 (two) times daily. (Patient not taking: Reported on 11/30/2015) 60 tablet 1   No current facility-administered medications for this visit.    Allergies:   Review of patient's allergies indicates no known allergies.    Social History:  The patient  reports that he quit smoking about 2 months ago. His smoking use included Cigarettes. He quit after 40 years of use. He does not have any smokeless tobacco history on file. He reports that he does not drink alcohol or use illicit drugs.   Family History:  The patient's family history includes Cancer in his other; Heart Problems in his mother; Heart attack in his father; Heart disease in his father.      PHYSICAL EXAM: VS:  BP 112/62 mmHg  Pulse 62  Ht 6\' 1"  (1.854 m)  Wt 184 lb 12 oz (83.802 kg)  BMI 24.38 kg/m2  SpO2 99% , BMI Body mass index is 24.38 kg/(m^2). GEN: Well nourished, well developed, in no acute distress HEENT: normal Neck: no JVD, carotid bruits, or masses Cardiac: RRR; no murmurs, rubs, or gallops,no edema  Respiratory:  clear to auscultation bilaterally, normal work of breathing GI: soft, nontender, nondistended, + BS MS: no deformity or atrophy Skin: warm and dry, no rash Neuro:  Strength and sensation are intact Psych: euthymic mood, full affect   EKG:  EKG is not ordered today.    Recent Labs: 09/14/2015: B Natriuretic Peptide 77.1 11/02/2015: BUN 15; Creatinine, Ser 1.13; Hemoglobin 15.1; Platelets 218; Potassium 4.5; Sodium 139 11/26/2015: ALT 90*    Lipid Panel    Component Value Date/Time   CHOL 153 11/26/2015 0815   CHOL 212* 09/23/2015 1540   TRIG 85 11/26/2015 0815   HDL 41 11/26/2015 0815   HDL 41 09/23/2015 1540   CHOLHDL 3.7 11/26/2015 0815   CHOLHDL 5.2* 09/23/2015 1540   VLDL 17 11/26/2015 0815   LDLCALC 95 11/26/2015 0815   LDLCALC 137* 09/23/2015 1540      Wt Readings from  Last 3 Encounters:  11/30/15 184 lb 12 oz (83.802 kg)  11/18/15 184 lb 12 oz (83.802 kg)  11/03/15 178 lb 12.7 oz (81.1 kg)        ASSESSMENT AND PLAN:  1.  Coronary artery disease involving native coronary arteries of native heart with other forms of angina : The patient's chest pain overall improved and recent stress test showed no major areas of ischemia. Apical wall ischemia is expected based on his most recent cardiac catheterization. Continue dual antiplatelet therapy and aggressive treatment of risk factors. He is going to start cardiac rehabilitation next month.  2.  GERD: The patient is having symptoms suggestive of GERD after he was started on dual antiplatelet therapy. Thus, I added Protonix 40 mg once daily.  3.  Ischemic cardiomyopathy: Most recent ejection fraction was 45-55%. Continue treatment with carvedilol. He is no longer on an ACE inhibitor  due to symptomatic hypotension.  4. Hyperlipidemia : LDL improved to 95. His liver profile is still mildly abnormal I asked him to follow-up with his primary care physician about that. He wants to follow-up at the [place]. I provided him with a copy of his labs.      Disposition:   FU with me in 3 months  Signed, Kathlyn Sacramento, MD  11/30/2015 3:35 PM    [facility]

## 2015-11-30 NOTE — Patient Instructions (Signed)
Medication Instructions:  Your physician has recommended you make the following change in your medication:  START taking protonix 40mg  daily   Labwork: none  Testing/Procedures: none  Follow-Up: Your physician recommends that you schedule a follow-up appointment in: 3 months with Dr. Fletcher Anon.    Any Other Special Instructions Will Be Listed Below (If Applicable).     If you need a refill on your cardiac medications before your next appointment, please call your pharmacy.

## 2015-12-15 ENCOUNTER — Telehealth: Payer: Self-pay | Admitting: Cardiovascular Disease

## 2015-12-15 NOTE — Telephone Encounter (Signed)
Received records request, forwarded to [facility] for processing.

## 2015-12-16 ENCOUNTER — Telehealth: Payer: Self-pay

## 2015-12-16 ENCOUNTER — Encounter: Payer: Self-pay | Admitting: Nurse Practitioner

## 2015-12-16 ENCOUNTER — Ambulatory Visit (INDEPENDENT_AMBULATORY_CARE_PROVIDER_SITE_OTHER): Payer: No Typology Code available for payment source | Admitting: Nurse Practitioner

## 2015-12-16 ENCOUNTER — Other Ambulatory Visit: Payer: Self-pay

## 2015-12-16 VITALS — BP 115/68 | HR 66 | Temp 97.0°F | Ht 73.0 in | Wt 188.0 lb

## 2015-12-16 DIAGNOSIS — R195 Other fecal abnormalities: Secondary | ICD-10-CM | POA: Diagnosis not present

## 2015-12-16 DIAGNOSIS — K219 Gastro-esophageal reflux disease without esophagitis: Secondary | ICD-10-CM | POA: Diagnosis not present

## 2015-12-16 NOTE — Telephone Encounter (Signed)
Dr. Oneida Alar, I know you're ok with colonoscopy on Plavix, but want to make sure about Effient. He is 3 months post-MI and cardiology has cleared him but dual antiplatelet therapy cannot be interrupted (see notes). Thanks!

## 2015-12-16 NOTE — Telephone Encounter (Signed)
Routing to Eric Gill, NP 

## 2015-12-16 NOTE — Assessment & Plan Note (Signed)
Patient with persistent GERD and dyspepsia symptoms. Was previously given Protonix but is not taking it because he doesn't like taking a lot of pills. Recommend take Protonix 40 mg daily to help with symptomatic improvement of his GERD symptoms. Return for follow-up based on timing of procedures.

## 2015-12-16 NOTE — Telephone Encounter (Signed)
EG WILL REVIEW ASGE RECOMMENDATION AND DISCUSS WITH DR. FIELDS.

## 2015-12-16 NOTE — Telephone Encounter (Signed)
3 months post MI is enough. He can undergo moderate sedation at low risk. However, dual antiplatelet therapy can not be interrupted for a year after his MI and stent placement. Thanks.  Rod Can

## 2015-12-16 NOTE — Assessment & Plan Note (Signed)
Primary care at the [facility] notes heme positive stool. Patient with no noted overt GI bleeding. He recently had an ST elevation myocardial infarction approximately 3 months ago status post stenting and currently on Effient. Recent cardiology visit shows no ischemia on recent stress testing. We will contact cardiology to discuss timing of nonemergent procedure as it typically like to wait a certain number of months after stenting. We'll also request clearance at that time to ensure they feel he is appropriate for conscious sedation. When information received from cardiology we can schedule him for a colonoscopy with possible upper endoscopy for further evaluation of heme positive stool. Details and risks/benefits of procedure discussed with the patient in anticipation of eventual procedure. Also discussed with him the need to bring him back for an office visit if we cannot schedule him within 30 days.  Proceed with TCS +/- EGD with 25 mg preprocedure Phenergan with Dr. Gala Romney in near future: the risks, benefits, and alternatives have been discussed with the patient in detail. The patient states understanding and desires to proceed.  The patient is currently on Effient. He is not on any chronic pain medications, anxiolytics, antidepressants. Has a history of excessive alcohol abuse although he has not had any alcohol in 20 years, denies drugs. We'll provide for 25 mg preprocedure Phenergan due to history of alcoholism.

## 2015-12-16 NOTE — Patient Instructions (Addendum)
1. Start taking Protonix 40 mg once a day, 30 meals before your first meal the day. 2. We will contact cardiology to discuss timing for your procedures. 3. We'll may have information back from cardiology we will call you to schedule the procedures over the phone.

## 2015-12-16 NOTE — Progress Notes (Addendum)
Primary Care Physician:  Alonza Bogus, MD Primary Gastroenterologist:  Dr. Oneida Alar  Chief Complaint  Patient presents with  . set up TCS/EGD    HPI:   Grant Cardenas is a 60 y.o. male who presents On referral from the [facility] for [facility] positive stools[facility]. No previous colonoscopy or endoscopy noted in our system, no comments about previous colonoscopy or endoscopy noted in the [place] chart.  Today he states he has never had a colonoscopy before. Is having frequent epigastric pain, belching, and heartburn symptoms since December. Denies other abdominal pain. Had a STEMI in December, last saw cardiologist approximately 2 weeks ago for follow-up when they noted improvement in chest pain and recent stress test with no concerns for ischemia; EF 45-55%. Denies N/V, hematochezia, melena, unintentional weight loss, fever, chills. Has daily bowel movement consistent with [place] 4. [facility] did a test which was heme+ stool. Does have occasional chest pain and shortness of breath, starts cardiac rehab next week. Denies dizziness, lightheadedness, syncope, near syncope. Denies any other upper or lower GI symptoms. Was previously prescribed Protonix for heartburn but hasn't taken it because he doesn't want to keep adding medications. Since MI has changed diet to heart healthy and is hoping that will help with heartburn.  Past Medical History  Diagnosis Date  . CAD (coronary artery disease)     a. anterior ST elevation MI in 09/2015. Cardiac cath showed occluded prox LAD and 60% ostial RCA stenosis. There was also very distal disease affecting the LAD and ramus. He underwent successful angioplasty and drug-eluting stent placement to the LAD. Ejection fraction was 35-40% which improved subsequently an echo to 55-60%.  . Hyperlipidemia   . Tobacco use   . Ischemic cardiomyopathy     a. EF by cath 09/13/2015: 35-40%; b. 09/14/2015: EF 55-60%, Probable akinesis of  the  midanteroseptal myocardium. akinesis of the apicalanterior myocardium, GR1DD  . MI (myocardial infarction) [facility])     Past Surgical History  Procedure Laterality Date  . Cardiac catheterization N/A 09/13/2015    Procedure: Left Heart Cath and Coronary Angiography;  Surgeon: Wellington Hampshire, MD;  Location: [facility];  Service: Cardiovascular;  Laterality: N/A;  . Cardiac catheterization N/A 09/13/2015    Procedure: Coronary Stent Intervention;  Surgeon: Wellington Hampshire, MD;  Location: [facility];  Service: Cardiovascular;  Laterality: N/A;  . Cardiac catheterization N/A 11/03/2015    Procedure: Left Heart Cath and Coronary Angiography;  Surgeon: Peter M Martinique, MD;  Location: [facility];  Service: Cardiovascular;  Laterality: N/A;    Current Outpatient Prescriptions  Medication Sig Dispense Refill  . aspirin EC 81 MG tablet Take 1 tablet (81 mg total) by mouth daily. 90 tablet 3  . atorvastatin (LIPITOR) 40 MG tablet Take 1 tablet (40 mg total) by mouth daily at 6 PM. 30 tablet 6  . carvedilol (COREG) 6.25 MG tablet Take 1 tablet (6.25 mg total) by mouth 2 (two) times daily with a meal. 30 tablet 1  . clobetasol ointment (TEMOVATE) [id number] % Apply 1 application topically 2 (two) times daily.   1  . folic acid (FOLVITE) 1 MG tablet Take 1 mg by mouth See admin instructions. Takes 1 tab daily except Fridays  3  . methotrexate (RHEUMATREX) 2.5 MG tablet Take 2.5 mg by mouth 3 (three) times a week.    . nitroGLYCERIN (NITROSTAT) 0.4 MG SL tablet Place 1 tablet (0.4 mg total) under the  tongue every 5 (five) minutes as needed for chest pain. 25 tablet 12  . prasugrel (EFFIENT) 10 MG TABS tablet Take 1 tablet (10 mg total) by mouth daily. 30 tablet 6  . ketoconazole (NIZORAL) 2 % cream Apply 1 application topically daily as needed for irritation. Reported on 12/16/2015    . pantoprazole (PROTONIX) 40 MG tablet Take 1 tablet (40 mg total) by mouth daily. (Patient not taking:  Reported on 12/16/2015) 30 tablet 5  . ranolazine (RANEXA) 500 MG 12 hr tablet Take 1 tablet (500 mg total) by mouth 2 (two) times daily. (Patient not taking: Reported on 11/30/2015) 60 tablet 1   No current facility-administered medications for this visit.    Allergies as of 12/16/2015  . (No Known Allergies)    Family History  Problem Relation Age of Onset  . Cancer Other   . Heart Problems Mother   . Heart attack Father   . Heart disease Father     Social History   Social History  . Marital Status: Married    Spouse Name: N/A  . Number of Children: N/A  . Years of Education: N/A   Occupational History  . Not on file.   Social History Main Topics  . Smoking status: Former Smoker -- 40 years    Types: Cigarettes    Quit date: 09/14/2015  . Smokeless tobacco: Not on file  . Alcohol Use: No  . Drug Use: No  . Sexual Activity: No   Other Topics Concern  . Not on file   Social History Narrative    Review of Systems: General: Negative for anorexia, weight loss, fever, chills, fatigue, weakness. Eyes: Negative for vision changes.  ENT: Negative for hoarseness, difficulty swallowing. CV: Negative for chest pain, angina, palpitations, peripheral edema.  Respiratory: Negative for dyspnea at rest, cough, sputum, wheezing.  GI: See history of present illness. MS: Negative for joint pain, low back pain.  Endo: Negative for unusual weight change.  Heme: Negative for bruising or bleeding. Allergy: Negative for rash or hives.    Physical Exam: BP 115/68 mmHg  Pulse 66  Temp(Src) 97 F (36.1 C)  Ht 6\' 1"  (1.854 m)  Wt 188 lb (85.276 kg)  BMI 24.81 kg/m2 General:   Alert and oriented. Pleasant and cooperative. Well-nourished and well-developed.  Head:  Normocephalic and atraumatic. Eyes:  Without icterus, sclera clear and conjunctiva pink.  Ears:  Normal auditory acuity. Cardiovascular:  S1, S2 present without murmurs appreciated. Extremities without clubbing or  edema. Respiratory:  Clear to auscultation bilaterally. No wheezes, rales, or rhonchi. No distress.  Gastrointestinal:  +BS, soft, non-tender and non-distended. No HSM noted. No guarding or rebound. No masses appreciated.  Rectal:  Deferred  Neurologic:  Alert and oriented x4;  grossly normal neurologically. Psych:  Alert and cooperative. Normal mood and affect. Heme/Lymph/Immune: No excessive bruising noted.    12/16/2015 8:59 AM   Disclaimer: This note was dictated with voice recognition software. Similar sounding words can inadvertently be transcribed and may not be corrected upon review.

## 2015-12-16 NOTE — Progress Notes (Signed)
REVIEWED. Pt need dual ANTI-PLATELET THERAPY FOR ONE YEAR. WILL REVIEW ASGE RECOMMENDATIONS.

## 2015-12-16 NOTE — Progress Notes (Signed)
cc'ed to pcp °

## 2015-12-16 NOTE — Telephone Encounter (Signed)
Dr. Fletcher Anon,   This patient was seen in our office today by Walden Field, NP.  He would like to schedule a colonoscopy and possible EGD for patient with Dr. Gala Romney in the near future.  However, we are aware of his MI with stent in 09/2015.  Randall Hiss would like to know how long you recommend that we wait for a clearance to schedule these procedures.   Please advise!

## 2015-12-22 ENCOUNTER — Encounter (HOSPITAL_COMMUNITY)
Admission: RE | Admit: 2015-12-22 | Discharge: 2015-12-22 | Disposition: A | Discharge status: Home / Self Care | Payer: No Typology Code available for payment source | Source: Ambulatory Visit | Attending: Family Medicine | Admitting: Family Medicine

## 2015-12-22 ENCOUNTER — Encounter (HOSPITAL_COMMUNITY): Payer: Self-pay

## 2015-12-22 VITALS — Ht 73.0 in | Wt 186.5 lb

## 2015-12-22 DIAGNOSIS — I213 ST elevation (STEMI) myocardial infarction of unspecified site: Secondary | ICD-10-CM | POA: Diagnosis not present

## 2015-12-22 DIAGNOSIS — I2102 ST elevation (STEMI) myocardial infarction involving left anterior descending coronary artery: Secondary | ICD-10-CM

## 2015-12-22 DIAGNOSIS — Z955 Presence of coronary angioplasty implant and graft: Secondary | ICD-10-CM

## 2015-12-22 NOTE — Progress Notes (Signed)
Cardiac/Pulmonary Rehab Medication Review by a Pharmacist  Does the patient  feel that his/her medications are working for him/her?  yes  Has the patient been experiencing any side effects to the medications prescribed?  yes  Does the patient measure his/her own blood pressure or blood glucose at home?  no   Does the patient have any problems obtaining medications due to transportation or finances?   no  Understanding of regimen: excellent Understanding of indications: good Potential of compliance: excellent  Questions asked to Determine Patient Understanding of Medication Regimen:  1. What is the name of the medication?  2. What is the medication used for?  3. When should it be taken?  4. How much should be taken?  5. How will you take it?  6. What side effects should you report?  Understanding Defined as: Excellent: All questions above are correct Good: Questions 1-4 are correct Fair: Questions 1-2 are correct  Poor: 1 or none of the above questions are correct   Pharmacist comments: Patient is compliant with medications. Also, medications are new for him and wishes he didn't have to take him. Patient is experiencing episodes of belching and burping and this was all new for him post his cardiac cath. Not sure what is causing the burping. Suggested purchasing and  monitoring his blood pressure/heart rate daily. He can obtain through the [place]. Has stopped smoking.  Thanks for allowing [place] to participate in the care of this patient,  Isac Sarna, BS [place]ena [place], [place] Pharmacist Pager [phone]  12/22/2015 3:43 PM

## 2015-12-22 NOTE — Patient Instructions (Signed)
Patient arrived for 1st visit/orientation/education at  1430. Patient was referred to CR by Dr. Luther Redo due to MI (I21.02) and Stent (Z95.5). During orientation advised patient on arrival and appointment times what to wear, what to do before, during and after exercise. Reviewed attendance and class policy. Talked about inclement weather and class consultation policy. Pt is scheduled to return Cardiac Rehab on 12/28/15 at 0930. Pt was advised to come to class 5 minutes before class starts. He was also given instructions on meeting with the dietician and attending the Family Structure classes. Pt is eager to get started. Patients entrance PHQ score is 0.  Patient was able to complete 6 minute walk test. Patient was measured for the equipment. Discussed equipment safety with patient. Took patient pre-anthropometric measurements. Patient finished visit at 1605 Pt has finished orientation and is scheduled to return to CR on 12/28/15 at 0930. Pt has been instructed to arrive to class 15 minutes early for scheduled class. Pt has been instructed to wear comfortable clothing and shoes with rubber soles. Pt has been told to take their medications 1 hour prior to coming to class.  If the patient is not going to attend class, he has been instructed to call.  Marland Kitchen

## 2015-12-23 NOTE — Telephone Encounter (Signed)
Effient (prasugrel) is a thienopyridine agent, and is the same class as Plavix (clopidogrel). Recommendations are the same for both medications and DAPT overall: consider cessation of agents with high bleeding risk.   Based on practice patterns with Plavix, will advise patient that because he is unable to come off DAPT post-stenting we can proceed with endoscopic evaluation of his heme+ stool. If there are any polyps found, management of these will be determined at that time in a fashin to mitigate bleeding risk as much as possible, which may include need for follow-up colonoscopy.

## 2015-12-23 NOTE — Telephone Encounter (Signed)
REVIEWED. AGREE. NO ADDITIONAL RECOMMENDATIONS. 

## 2015-12-23 NOTE — Telephone Encounter (Signed)
Please notify the patient we can proceed with his colonoscopy. Continuing his blood thinners is necessary for 12 months after stenting, which carries somewhat of an increased risk of bleeding for polyp removal, typically more so with very large polyps. If Dr. Oneida Alar does find any polyps, the decision on whether or not to try and remove them will be made at that time in order to keep his bleeding risk as low as possible.  Please schedule TCS with [facility] as per OV note.

## 2015-12-24 ENCOUNTER — Ambulatory Visit: Payer: BLUE CROSS/BLUE SHIELD | Admitting: Cardiovascular Disease

## 2015-12-25 ENCOUNTER — Other Ambulatory Visit: Payer: Self-pay

## 2015-12-25 NOTE — Telephone Encounter (Signed)
PT's wife is aware.  She said that [facility] has already scheduled him.

## 2015-12-28 ENCOUNTER — Encounter (HOSPITAL_COMMUNITY)
Admission: RE | Admit: 2015-12-28 | Discharge: 2015-12-28 | Disposition: A | Discharge status: Home / Self Care | Payer: No Typology Code available for payment source | Source: Ambulatory Visit | Attending: Family Medicine | Admitting: Family Medicine

## 2015-12-28 ENCOUNTER — Other Ambulatory Visit: Payer: Self-pay

## 2015-12-28 DIAGNOSIS — K219 Gastro-esophageal reflux disease without esophagitis: Secondary | ICD-10-CM

## 2015-12-28 DIAGNOSIS — R195 Other fecal abnormalities: Secondary | ICD-10-CM

## 2015-12-28 DIAGNOSIS — I213 ST elevation (STEMI) myocardial infarction of unspecified site: Secondary | ICD-10-CM | POA: Diagnosis not present

## 2015-12-28 NOTE — Telephone Encounter (Signed)
Pt is set up for 01/29/16 @ 10:30. Wife came by to pick up instructions and prep.

## 2015-12-28 NOTE — Progress Notes (Signed)
Cardiac Individual Treatment Plan  Patient Details  Name: Grant Cardenas MRN: [id number] Date of Birth: Mar 28, 1956 Referring Provider:  Meredith Mody, MD  Initial Encounter Date:       CARDIAC REHAB PHASE II ORIENTATION from 12/22/2015 in [place]   Date  12/22/15      Visit Diagnosis: No diagnosis found.  Patient's Home Medications on Admission:  Current outpatient prescriptions:  .  aspirin EC 81 MG tablet, Take 1 tablet (81 mg total) by mouth daily., Disp: 90 tablet, Rfl: 3 .  atorvastatin (LIPITOR) 40 MG tablet, Take 1 tablet (40 mg total) by mouth daily at 6 PM., Disp: 30 tablet, Rfl: 6 .  carvedilol (COREG) 6.25 MG tablet, Take 1 tablet (6.25 mg total) by mouth 2 (two) times daily with a meal., Disp: 30 tablet, Rfl: 1 .  clobetasol ointment (TEMOVATE) [id number] %, Apply 1 application topically 2 (two) times daily. , Disp: , Rfl: 1 .  folic acid (FOLVITE) 1 MG tablet, Take 1 mg by mouth See admin instructions. Takes 1 tab daily except Fridays, Disp: , Rfl: 3 .  methotrexate (RHEUMATREX) 2.5 MG tablet, Take 12.5 mg by mouth once a week. , Disp: , Rfl:  .  nitroGLYCERIN (NITROSTAT) 0.4 MG SL tablet, Place 1 tablet (0.4 mg total) under the tongue every 5 (five) minutes as needed for chest pain., Disp: 25 tablet, Rfl: 12 .  pantoprazole (PROTONIX) 40 MG tablet, Take 40 mg by mouth daily with breakfast., Disp: , Rfl: 5 .  prasugrel (EFFIENT) 10 MG TABS tablet, Take 1 tablet (10 mg total) by mouth daily., Disp: 30 tablet, Rfl: 6  Past Medical History: Past Medical History  Diagnosis Date  . CAD (coronary artery disease)     a. anterior ST elevation MI in 09/2015. Cardiac cath showed occluded prox LAD and 60% ostial RCA stenosis. There was also very distal disease affecting the LAD and ramus. He underwent successful angioplasty and drug-eluting stent placement to the LAD. Ejection fraction was 35-40% which improved subsequently an echo to 55-60%.  .  Hyperlipidemia   . Tobacco use   . Ischemic cardiomyopathy     a. EF by cath 09/13/2015: 35-40%; b. 09/14/2015: EF 55-60%, Probable akinesis of  the midanteroseptal myocardium. akinesis of the apicalanterior myocardium, GR1DD  . MI (myocardial infarction) ([facility])   . Psoriasis   . Vitreous floaters of both eyes     Tobacco Use: History  Smoking status  . Former Smoker -- 36 years  . Types: Cigarettes  . Quit date: 09/14/2015  Smokeless tobacco  . Never Used    Labs: Recent Review Flowsheet Data    Labs for ITP Cardiac and Pulmonary Rehab Latest Ref Rng 09/13/2015 09/14/2015 09/23/2015 11/26/2015   Cholestrol 0 - 200 mg/dL - 271(H) 212(H) 153   LDLCALC 0 - 99 mg/dL - 208(H) 137(H) 95   HDL >40 mg/dL - 40(L) 41 41   Trlycerides <150 mg/dL - 116 171(H) 85   TCO2 0 - 100 mmol/L 26 - - -      Capillary Blood Glucose: No results found for: GLUCAP   Exercise Target Goals:    Exercise Program Goal: Individual exercise prescription set with THRR, safety & activity barriers. Participant demonstrates ability to understand and report RPE using BORG scale, to self-measure pulse accurately, and to acknowledge the importance of the exercise prescription.  Exercise Prescription Goal: Starting with aerobic activity 30 plus minutes a day, 3 days per week for initial exercise  prescription. Provide home exercise prescription and guidelines that participant acknowledges understanding prior to discharge.  Activity Barriers & Risk Stratification:     Activity Barriers & Cardiac Risk Stratification - 12/22/15 1558    Activity Barriers & Cardiac Risk Stratification   Activity Barriers None   Cardiac Risk Stratification High      6 Minute Walk:     6 Minute Walk      12/22/15 1506       6 Minute Walk   Phase Initial     Distance 1350 feet     Walk Time 6 minutes     # of Rest Breaks 0     MPH 2.56     METS 2.96     RPE 11     Perceived Dyspnea  13     VO2 Peak 12.54      Symptoms No     Resting HR 73 bpm     Resting BP 108/60 mmHg     Max Ex. HR 88 bpm     Max Ex. BP 120/72 mmHg     2 Minute Post BP 106/68 mmHg     Pre/Post BP   Baseline BP 108/60 mmHg     6 Minute BP 120/72 mmHg     Pre/Post BP? Yes        Initial Exercise Prescription:     Initial Exercise Prescription - 12/22/15 1500    Date of Initial Exercise Prescription   Date 12/22/15   Treadmill   MPH 1.8   Grade 0   Minutes 15   METs 2.38   NuStep   Level 2   Minutes 15   METs 1.5   Prescription Details   Frequency (times per week) 3   Intensity   THRR REST +  30   THRR 40-80% of Max Heartrate 107-141   Ratings of Perceived Exertion 11-13   Progression   Progression Continue to progress workloads to maintain intensity without signs/symptoms of physical distress.   Resistance Training   Training Prescription Yes   Weight 1.0   Reps 10-12      Perform Capillary Blood Glucose checks as needed.  Exercise Prescription Changes:     Exercise Prescription Changes      12/28/15 1300           Exercise Review   Progression Yes       Response to Exercise   Blood Pressure (Admit) 96/64 mmHg       Blood Pressure (Exercise) 140/66 mmHg       Blood Pressure (Exit) 100/60 mmHg       Heart Rate (Admit) 67 bpm       Heart Rate (Exercise) 81 bpm       Heart Rate (Exit) 64 bpm       Rating of Perceived Exertion (Exercise) 11       Symptoms No       Progression   Progression Continue to progress workloads to maintain intensity without signs/symptoms of physical distress.       Resistance Training   Training Prescription Yes       Weight 1.0       Reps 10-12       Interval Training   Interval Training No       Treadmill   MPH 1.5       Grade 0       Minutes 15       METs 2.1  NuStep   Level 2       Minutes 15       METs 3.58          Exercise Comments:    Discharge Exercise Prescription (Final Exercise Prescription Changes):     Exercise  Prescription Changes - 12/28/15 1300    Exercise Review   Progression Yes   Response to Exercise   Blood Pressure (Admit) 96/64 mmHg   Blood Pressure (Exercise) 140/66 mmHg   Blood Pressure (Exit) 100/60 mmHg   Heart Rate (Admit) 67 bpm   Heart Rate (Exercise) 81 bpm   Heart Rate (Exit) 64 bpm   Rating of Perceived Exertion (Exercise) 11   Symptoms No   Progression   Progression Continue to progress workloads to maintain intensity without signs/symptoms of physical distress.   Resistance Training   Training Prescription Yes   Weight 1.0   Reps 10-12   Interval Training   Interval Training No   Treadmill   MPH 1.5   Grade 0   Minutes 15   METs 2.1   NuStep   Level 2   Minutes 15   METs 3.58      Nutrition:  Target Goals: Understanding of nutrition guidelines, daily intake of sodium 1500mg , cholesterol 200mg , calories 30% from fat and 7% or less from saturated fats, daily to have 5 or more servings of fruits and vegetables.  Biometrics:     Pre Biometrics - 12/22/15 1537    Pre Biometrics   Height 6\' 1"  (1.854 m)   Weight 186 lb 8 oz (84.596 kg)   Waist Circumference 35 inches   Hip Circumference 38 inches   Waist to Hip Ratio 0.92 %   BMI (Calculated) 24.7   Triceps Skinfold 9 mm   % Body Fat 21.3 %   Grip Strength 100 kg   Flexibility 12 in   Single Leg Stand 60 seconds       Nutrition Therapy Plan and Nutrition Goals:     Nutrition Therapy & Goals - 12/22/15 1601    Intervention Plan   Intervention Nutrition handout(s) given to patient.   Expected Outcomes Short Term Goal: Understand basic principles of dietary content, such as calories, fat, sodium, cholesterol and nutrients.      Nutrition Discharge: Rate Your Plate Scores:     Nutrition Assessments - 12/22/15 1601    MEDFICTS Scores   Pre Score 6      Nutrition Goals Re-Evaluation:   Psychosocial: Target Goals: Acknowledge presence or absence of depression, maximize coping skills,  provide positive support system. Participant is able to verbalize types and ability to use techniques and skills needed for reducing stress and depression.  Initial Review & Psychosocial Screening:     Initial Psych Review & Screening - 12/22/15 1602    Initial Review   Current issues with Current Stress Concerns   Source of Stress Concerns --  not being able to eat what he wants and wants a cigarette   [place]? Yes   Barriers   Psychosocial barriers to participate in program There are no identifiable barriers or psychosocial needs.   Screening Interventions   Interventions Encouraged to exercise      Quality of Life Scores:     Quality of Life - 12/22/15 1539    Quality of Life Scores   Health/Function Pre 10.03 %   Socioeconomic Pre 19.86 %   Psych/Spiritual Pre 14.93 %   Family Pre  12.88 %   GLOBAL Pre 13.5 %      PHQ-9:     Recent Review Flowsheet Data    Depression screen [facility] 2/9 12/22/2015   Decreased Interest 0   Down, Depressed, Hopeless 0   PHQ - 2 Score 0   Altered sleeping 0   Tired, decreased energy 0   Change in appetite 0   Feeling bad or failure about yourself  0   Trouble concentrating 0   Moving slowly or fidgety/restless 0   Suicidal thoughts 0   PHQ-9 Score 0   Difficult doing work/chores Not difficult at all      Psychosocial Evaluation and Intervention:     Psychosocial Evaluation - 12/22/15 1603    Psychosocial Evaluation & Interventions   Interventions Encouraged to exercise with the program and follow exercise prescription   Continued Psychosocial Services Needed No      Psychosocial Re-Evaluation:   Vocational Rehabilitation: Provide vocational rehab assistance to qualifying candidates.   Vocational Rehab Evaluation & Intervention:     Vocational Rehab - 12/22/15 1559    Initial Vocational Rehab Evaluation & Intervention   Assessment shows need for Vocational Rehabilitation No       Education: Education Goals: Education classes will be provided on a weekly basis, covering required topics. Participant will state understanding/return demonstration of topics presented.  Learning Barriers/Preferences:     Learning Barriers/Preferences - 12/22/15 1559    Learning Barriers/Preferences   Learning Preferences Skilled Demonstration      Education Topics: Hypertension, Hypertension Reduction -Define heart disease and high blood pressure. Discus how high blood pressure affects the body and ways to reduce high blood pressure.   Exercise and Your Heart -Discuss why it is important to exercise, the FITT principles of exercise, normal and abnormal responses to exercise, and how to exercise safely.   Angina -Discuss definition of angina, causes of angina, treatment of angina, and how to decrease risk of having angina.   Cardiac Medications -Review what the following cardiac medications are used for, how they affect the body, and side effects that may occur when taking the medications.  Medications include Aspirin, Beta blockers, calcium channel blockers, ACE Inhibitors, angiotensin receptor blockers, diuretics, digoxin, and antihyperlipidemics.   Congestive Heart Failure -Discuss the definition of CHF, how to live with CHF, the signs and symptoms of CHF, and how keep track of weight and sodium intake.   Heart Disease and Intimacy -Discus the effect sexual activity has on the heart, how changes occur during intimacy as we age, and safety during sexual activity.   Smoking Cessation / COPD -Discuss different methods to quit smoking, the health benefits of quitting smoking, and the definition of COPD.   Nutrition I: Fats -Discuss the types of cholesterol, what cholesterol does to the heart, and how cholesterol levels can be controlled.   Nutrition II: Labels -Discuss the different components of food labels and how to read food label   Heart Parts and Heart  Disease -Discuss the anatomy of the heart, the pathway of blood circulation through the heart, and these are affected by heart disease.   Stress I: Signs and Symptoms -Discuss the causes of stress, how stress may lead to anxiety and depression, and ways to limit stress.   Stress II: Relaxation -Discuss different types of relaxation techniques to limit stress.   Warning Signs of Stroke / TIA -Discuss definition of a stroke, what the signs and symptoms are of a stroke, and how to identify when  someone is having stroke.   Knowledge Questionnaire Score:     Knowledge Questionnaire Score - 12/22/15 1559    Knowledge Questionnaire Score   Pre Score 17/24      Core Components/Risk Factors/Patient Goals at Admission:     Personal Goals and Risk Factors at Admission - 12/22/15 1601    Core Components/Risk Factors/Patient Goals on Admission   Improve shortness of breath with ADL's Yes   Intervention Provide education, individualized exercise plan and daily activity instruction to help decrease symptoms of SOB with activities of daily living.   Expected Outcomes Short Term: Achieves a reduction of symptoms when performing activities of daily living.   Develop more efficient breathing techniques such as purse lipped breathing and diaphragmatic breathing; and practicing self-pacing with activity Yes   Intervention Provide education, demonstration and support about specific breathing techniuqes utilized for more efficient breathing. Include techniques such as pursed lipped breathing, diaphragmatic breathing and self-pacing activity.   Expected Outcomes Short Term: Participant will be able to demonstrate and use breathing techniques as needed throughout daily activities.   Hypertension Yes   Intervention Monitor prescription use compliance.;Provide education on lifestyle modifcations including regular physical activity/exercise, weight management, moderate sodium restriction and increased  consumption of fresh fruit, vegetables, and low fat dairy, alcohol moderation, and smoking cessation.   Expected Outcomes Long Term: Maintenance of blood pressure at goal levels.   Lipids Yes   Intervention Provide education and support for participant on nutrition & aerobic/resistive exercise along with prescribed medications to achieve LDL 70mg , HDL >40mg .   Expected Outcomes Long Term: Cholesterol controlled with medications as prescribed, with individualized exercise RX and with personalized nutrition plan. Value goals: LDL < 70mg , HDL > 40 mg.      Core Components/Risk Factors/Patient Goals Review:    Core Components/Risk Factors/Patient Goals at Discharge (Final Review):    ITP Comments:     ITP Comments      12/22/15 1559           ITP Comments Patient is a 60 year old man that lives with spouse.  Patient denies depression but stressed he cant eat what he wants and smoke a cigarette. Enjoys golf.           Comments: Patient has only had one session

## 2015-12-30 ENCOUNTER — Encounter (HOSPITAL_COMMUNITY)
Admission: RE | Admit: 2015-12-30 | Discharge: 2015-12-30 | Disposition: A | Discharge status: Home / Self Care | Payer: No Typology Code available for payment source | Source: Ambulatory Visit | Attending: Family Medicine | Admitting: Family Medicine

## 2015-12-30 DIAGNOSIS — I213 ST elevation (STEMI) myocardial infarction of unspecified site: Secondary | ICD-10-CM | POA: Diagnosis not present

## 2016-01-01 ENCOUNTER — Encounter (HOSPITAL_COMMUNITY)
Admission: RE | Admit: 2016-01-01 | Discharge: 2016-01-01 | Disposition: A | Discharge status: Home / Self Care | Payer: No Typology Code available for payment source | Source: Ambulatory Visit | Attending: Family Medicine | Admitting: Family Medicine

## 2016-01-01 DIAGNOSIS — I213 ST elevation (STEMI) myocardial infarction of unspecified site: Secondary | ICD-10-CM | POA: Diagnosis not present

## 2016-01-04 ENCOUNTER — Encounter (HOSPITAL_COMMUNITY)
Admission: RE | Admit: 2016-01-04 | Discharge: 2016-01-04 | Disposition: A | Discharge status: Home / Self Care | Payer: No Typology Code available for payment source | Source: Ambulatory Visit | Attending: Family Medicine | Admitting: Family Medicine

## 2016-01-04 DIAGNOSIS — I213 ST elevation (STEMI) myocardial infarction of unspecified site: Secondary | ICD-10-CM | POA: Diagnosis not present

## 2016-01-06 ENCOUNTER — Encounter (HOSPITAL_COMMUNITY)
Admission: RE | Admit: 2016-01-06 | Discharge: 2016-01-06 | Disposition: A | Discharge status: Home / Self Care | Payer: No Typology Code available for payment source | Source: Ambulatory Visit | Attending: Family Medicine | Admitting: Family Medicine

## 2016-01-06 DIAGNOSIS — I213 ST elevation (STEMI) myocardial infarction of unspecified site: Secondary | ICD-10-CM | POA: Diagnosis not present

## 2016-01-08 ENCOUNTER — Encounter (HOSPITAL_COMMUNITY)
Admission: RE | Admit: 2016-01-08 | Discharge: 2016-01-08 | Disposition: A | Discharge status: Home / Self Care | Payer: No Typology Code available for payment source | Source: Ambulatory Visit | Attending: Family Medicine | Admitting: Family Medicine

## 2016-01-08 DIAGNOSIS — I213 ST elevation (STEMI) myocardial infarction of unspecified site: Secondary | ICD-10-CM | POA: Diagnosis not present

## 2016-01-11 ENCOUNTER — Encounter (HOSPITAL_COMMUNITY)
Admission: RE | Admit: 2016-01-11 | Discharge: 2016-01-11 | Disposition: A | Discharge status: Home / Self Care | Payer: No Typology Code available for payment source | Source: Ambulatory Visit | Attending: Family Medicine | Admitting: Family Medicine

## 2016-01-11 DIAGNOSIS — I213 ST elevation (STEMI) myocardial infarction of unspecified site: Secondary | ICD-10-CM | POA: Diagnosis not present

## 2016-01-12 ENCOUNTER — Telehealth: Payer: Self-pay | Admitting: General Practice

## 2016-01-12 NOTE — Telephone Encounter (Signed)
I spoke with [employer] and they will resubmit the claim with the authorization.  I also made the Mrs. Tengan aware of this change.

## 2016-01-12 NOTE — Telephone Encounter (Signed)
I received a call from the patinet's wife in regards to a bill they received from our practice in the amount of $355.  The patient has [employer] insurance and I verified that we have an approval on file from the [place].  I sent an e-mail to [facility] for them to investigate why this claim was not paid.  I told Mrs. Seda that I will call her back with an update as soon as I receive a response from [place].

## 2016-01-13 ENCOUNTER — Encounter (HOSPITAL_COMMUNITY)
Admission: RE | Admit: 2016-01-13 | Discharge: 2016-01-13 | Disposition: A | Discharge status: Home / Self Care | Payer: No Typology Code available for payment source | Source: Ambulatory Visit | Attending: Family Medicine | Admitting: Family Medicine

## 2016-01-13 DIAGNOSIS — I213 ST elevation (STEMI) myocardial infarction of unspecified site: Secondary | ICD-10-CM | POA: Diagnosis not present

## 2016-01-15 ENCOUNTER — Encounter (HOSPITAL_COMMUNITY)
Admission: RE | Admit: 2016-01-15 | Discharge: 2016-01-15 | Disposition: A | Discharge status: Home / Self Care | Payer: No Typology Code available for payment source | Source: Ambulatory Visit | Attending: Family Medicine | Admitting: Family Medicine

## 2016-01-15 DIAGNOSIS — I213 ST elevation (STEMI) myocardial infarction of unspecified site: Secondary | ICD-10-CM | POA: Diagnosis not present

## 2016-01-18 ENCOUNTER — Encounter (HOSPITAL_COMMUNITY)
Admission: RE | Admit: 2016-01-18 | Discharge: 2016-01-18 | Disposition: A | Discharge status: Home / Self Care | Payer: No Typology Code available for payment source | Source: Ambulatory Visit | Attending: Family Medicine | Admitting: Family Medicine

## 2016-01-18 DIAGNOSIS — I213 ST elevation (STEMI) myocardial infarction of unspecified site: Secondary | ICD-10-CM | POA: Diagnosis not present

## 2016-01-20 ENCOUNTER — Encounter (HOSPITAL_COMMUNITY)
Admission: RE | Admit: 2016-01-20 | Discharge: 2016-01-20 | Disposition: A | Discharge status: Home / Self Care | Payer: No Typology Code available for payment source | Source: Ambulatory Visit | Attending: Family Medicine | Admitting: Family Medicine

## 2016-01-20 DIAGNOSIS — I213 ST elevation (STEMI) myocardial infarction of unspecified site: Secondary | ICD-10-CM | POA: Diagnosis not present

## 2016-01-22 ENCOUNTER — Encounter (HOSPITAL_COMMUNITY)
Admission: RE | Admit: 2016-01-22 | Discharge: 2016-01-22 | Disposition: A | Discharge status: Home / Self Care | Payer: No Typology Code available for payment source | Source: Ambulatory Visit | Attending: Family Medicine | Admitting: Family Medicine

## 2016-01-22 DIAGNOSIS — I213 ST elevation (STEMI) myocardial infarction of unspecified site: Secondary | ICD-10-CM | POA: Diagnosis not present

## 2016-01-25 ENCOUNTER — Encounter (HOSPITAL_COMMUNITY)
Admission: RE | Admit: 2016-01-25 | Discharge: 2016-01-25 | Disposition: A | Discharge status: Home / Self Care | Payer: No Typology Code available for payment source | Source: Ambulatory Visit | Attending: Family Medicine | Admitting: Family Medicine

## 2016-01-25 DIAGNOSIS — I213 ST elevation (STEMI) myocardial infarction of unspecified site: Secondary | ICD-10-CM | POA: Diagnosis not present

## 2016-01-27 ENCOUNTER — Encounter (HOSPITAL_COMMUNITY)
Admission: RE | Admit: 2016-01-27 | Discharge: 2016-01-27 | Disposition: A | Discharge status: Home / Self Care | Payer: No Typology Code available for payment source | Source: Ambulatory Visit | Attending: Family Medicine | Admitting: Family Medicine

## 2016-01-27 DIAGNOSIS — I213 ST elevation (STEMI) myocardial infarction of unspecified site: Secondary | ICD-10-CM | POA: Diagnosis not present

## 2016-01-29 ENCOUNTER — Encounter (HOSPITAL_COMMUNITY)
Admission: RE | Disposition: A | Discharge status: Home / Self Care | Payer: Self-pay | Source: Ambulatory Visit | Attending: Gastroenterology

## 2016-01-29 ENCOUNTER — Ambulatory Visit (HOSPITAL_COMMUNITY)
Admission: RE | Admit: 2016-01-29 | Discharge: 2016-01-29 | Disposition: A | Discharge status: Home / Self Care | Payer: No Typology Code available for payment source | Source: Ambulatory Visit | Attending: Gastroenterology | Admitting: Gastroenterology

## 2016-01-29 ENCOUNTER — Encounter (HOSPITAL_COMMUNITY): Payer: Self-pay | Admitting: *Deleted

## 2016-01-29 ENCOUNTER — Encounter (HOSPITAL_COMMUNITY)
Admission: RE | Admit: 2016-01-29 | Discharge: 2016-01-29 | Disposition: A | Discharge status: Home / Self Care | Payer: No Typology Code available for payment source | Source: Ambulatory Visit | Attending: Family Medicine | Admitting: Family Medicine

## 2016-01-29 DIAGNOSIS — Z79899 Other long term (current) drug therapy: Secondary | ICD-10-CM | POA: Insufficient documentation

## 2016-01-29 DIAGNOSIS — K449 Diaphragmatic hernia without obstruction or gangrene: Secondary | ICD-10-CM | POA: Diagnosis not present

## 2016-01-29 DIAGNOSIS — K644 Residual hemorrhoidal skin tags: Secondary | ICD-10-CM | POA: Insufficient documentation

## 2016-01-29 DIAGNOSIS — K2970 Gastritis, unspecified, without bleeding: Secondary | ICD-10-CM | POA: Diagnosis not present

## 2016-01-29 DIAGNOSIS — D126 Benign neoplasm of colon, unspecified: Secondary | ICD-10-CM | POA: Insufficient documentation

## 2016-01-29 DIAGNOSIS — E785 Hyperlipidemia, unspecified: Secondary | ICD-10-CM | POA: Diagnosis not present

## 2016-01-29 DIAGNOSIS — K222 Esophageal obstruction: Secondary | ICD-10-CM | POA: Insufficient documentation

## 2016-01-29 DIAGNOSIS — R1013 Epigastric pain: Secondary | ICD-10-CM | POA: Diagnosis not present

## 2016-01-29 DIAGNOSIS — K635 Polyp of colon: Secondary | ICD-10-CM

## 2016-01-29 DIAGNOSIS — R195 Other fecal abnormalities: Secondary | ICD-10-CM | POA: Diagnosis not present

## 2016-01-29 DIAGNOSIS — D125 Benign neoplasm of sigmoid colon: Secondary | ICD-10-CM | POA: Diagnosis not present

## 2016-01-29 DIAGNOSIS — I252 Old myocardial infarction: Secondary | ICD-10-CM | POA: Insufficient documentation

## 2016-01-29 DIAGNOSIS — D128 Benign neoplasm of rectum: Secondary | ICD-10-CM | POA: Insufficient documentation

## 2016-01-29 DIAGNOSIS — Z87891 Personal history of nicotine dependence: Secondary | ICD-10-CM | POA: Diagnosis not present

## 2016-01-29 DIAGNOSIS — Z7982 Long term (current) use of aspirin: Secondary | ICD-10-CM | POA: Diagnosis not present

## 2016-01-29 DIAGNOSIS — K269 Duodenal ulcer, unspecified as acute or chronic, without hemorrhage or perforation: Secondary | ICD-10-CM | POA: Insufficient documentation

## 2016-01-29 DIAGNOSIS — I2510 Atherosclerotic heart disease of native coronary artery without angina pectoris: Secondary | ICD-10-CM | POA: Diagnosis not present

## 2016-01-29 DIAGNOSIS — K219 Gastro-esophageal reflux disease without esophagitis: Secondary | ICD-10-CM

## 2016-01-29 DIAGNOSIS — D123 Benign neoplasm of transverse colon: Secondary | ICD-10-CM | POA: Diagnosis not present

## 2016-01-29 DIAGNOSIS — K2980 Duodenitis without bleeding: Secondary | ICD-10-CM | POA: Diagnosis not present

## 2016-01-29 DIAGNOSIS — K621 Rectal polyp: Secondary | ICD-10-CM | POA: Diagnosis not present

## 2016-01-29 DIAGNOSIS — Z955 Presence of coronary angioplasty implant and graft: Secondary | ICD-10-CM | POA: Diagnosis not present

## 2016-01-29 HISTORY — PX: ESOPHAGOGASTRODUODENOSCOPY: SHX5428

## 2016-01-29 HISTORY — PX: COLONOSCOPY: SHX5424

## 2016-01-29 LAB — HM COLONOSCOPY

## 2016-01-29 SURGERY — COLONOSCOPY
Anesthesia: Moderate Sedation

## 2016-01-29 MED ORDER — EPINEPHRINE HCL 0.1 MG/ML IJ SOSY
PREFILLED_SYRINGE | INTRAMUSCULAR | Status: DC | PRN
  Administered 2016-01-29: 1 mL

## 2016-01-29 MED ORDER — SODIUM CHLORIDE 0.9% FLUSH
INTRAVENOUS | Status: DC
Start: 2016-01-29 — End: 2016-01-29
  Filled 2016-01-29: qty 10

## 2016-01-29 MED ORDER — EPINEPHRINE HCL 0.1 MG/ML IJ SOSY
PREFILLED_SYRINGE | INTRAMUSCULAR | Status: AC
  Filled 2016-01-29: qty 10

## 2016-01-29 MED ORDER — LIDOCAINE VISCOUS 2 % MT SOLN
OROMUCOSAL | Status: AC
  Filled 2016-01-29: qty 15

## 2016-01-29 MED ORDER — PROMETHAZINE HCL 25 MG/ML IJ SOLN
INTRAMUSCULAR | Status: AC
  Filled 2016-01-29: qty 1

## 2016-01-29 MED ORDER — PROMETHAZINE HCL 25 MG/ML IJ SOLN
25.0000 mg | Freq: Once | INTRAMUSCULAR | Status: AC
  Administered 2016-01-29: 25 mg via INTRAVENOUS

## 2016-01-29 MED ORDER — SODIUM CHLORIDE 0.9 % IV SOLN
INTRAVENOUS | Status: DC
  Administered 2016-01-29: 1000 mL via INTRAVENOUS

## 2016-01-29 MED ORDER — MIDAZOLAM HCL 5 MG/5ML IJ SOLN
INTRAMUSCULAR | Status: DC | PRN
  Administered 2016-01-29: 1 mg via INTRAVENOUS
  Administered 2016-01-29: 2 mg via INTRAVENOUS
  Administered 2016-01-29: 1 mg via INTRAVENOUS

## 2016-01-29 MED ORDER — MIDAZOLAM HCL 5 MG/5ML IJ SOLN
INTRAMUSCULAR | Status: AC
  Filled 2016-01-29: qty 10

## 2016-01-29 MED ORDER — MEPERIDINE HCL 100 MG/ML IJ SOLN
INTRAMUSCULAR | Status: DC | PRN
  Administered 2016-01-29: 25 mg via INTRAVENOUS
  Administered 2016-01-29: 50 mg via INTRAVENOUS

## 2016-01-29 MED ORDER — STERILE WATER FOR IRRIGATION IR SOLN
Status: DC | PRN
  Administered 2016-01-29: 13:00:00

## 2016-01-29 MED ORDER — MEPERIDINE HCL 100 MG/ML IJ SOLN
INTRAMUSCULAR | Status: AC
  Filled 2016-01-29: qty 2

## 2016-01-29 MED ORDER — SPOT INK MARKER SYRINGE KIT
PACK | SUBMUCOSAL | Status: DC | PRN
  Administered 2016-01-29: 1 mL via SUBMUCOSAL

## 2016-01-29 NOTE — Op Note (Signed)
[facility] Patient Name: Grant Cardenas Procedure Date: 01/29/2016 1:14 PM MRN: [id number] Date of Birth: 08-05-1956 Attending MD: Barney Drain , MD CSN: [id number] Age: 60 Admit Type: Outpatient Procedure:                Upper GI endoscopy Indications:              Heme positive stool Providers:                Barney Drain, MD, Rosina Lowenstein, RN, Georgeann Oppenheim,                            Technician Referring MD:             Jasper Loser. Luan Pulling, MD Medicines:                Meperidine 25 mg IV, Midazolam 1 mg IV + TCS Complications:            No immediate complications. Estimated Blood Loss:     Estimated blood loss was minimal. Procedure:                Pre-Anesthesia Assessment:                           - Prior to the procedure, a History and Physical                            was performed, and patient medications and                            allergies were reviewed. The patient's tolerance of                            previous anesthesia was also reviewed. The risks                            and benefits of the procedure and the sedation                            options and risks were discussed with the patient.                            All questions were answered, and informed consent                            was obtained. Prior Anticoagulants: The patient has                            taken aspirin & EFFIENT, last dose was day of                            procedure. ASA Grade Assessment: II - A patient                            with mild systemic disease. After reviewing the  risks and benefits, the patient was deemed in                            satisfactory condition to undergo the procedure.                            After obtaining informed consent, the endoscope was                            passed under direct vision. Throughout the                            procedure, the patient's blood pressure, pulse, and                 oxygen saturations were monitored continuously. The                            [id number]) scope was introduced through the                            mouth, and advanced to the second part of duodenum.                            The upper GI endoscopy was somewhat difficult due                            to the patient's agitation. The patient tolerated                            the procedure well. Scope In: 1:19:18 PM Scope Out: 1:26:23 PM Total Procedure Duration: 0 hours 7 minutes 5 seconds  Findings:      One mild benign-appearing, intrinsic stenosis was found. And was not       traversed.      A small hiatal hernia was present.      Scattered moderate inflammation characterized by congestion (edema),       erosions and erythema was found in the gastric antrum. Biopsies were       taken with a cold forceps for Helicobacter pylori testing.      A few localized erosions without bleeding were found in the duodenal       bulb. Impression:               - Benign-appearing esophageal stenosis.                           - Small hiatal hernia.                           - Gastritis. Biopsied.                           - Duodenal erosions without bleeding.                           HEME POSITIVE STOOLS DUE TO  GASTRITIS/DUODENITIS/COLON POLYPS Moderate Sedation:      Moderate (conscious) sedation was administered by the endoscopy nurse       and supervised by the endoscopist. The following parameters were       monitored: oxygen saturation, heart rate, blood pressure, and response       to care. Total physician intraservice time was 55 minutes. Recommendation:           - Patient has a contact number available for                            emergencies. The signs and symptoms of potential                            delayed complications were discussed with the                            patient. Return to normal activities tomorrow.                             Written discharge instructions were provided to the                            patient.                           NO MRI FOR 30 DAYS DUE TO METAL CLIP PLACEMENT IN                            THE COLON.                           CONTINUE PROTONIX. TAKE 30 MINUTES PRIOR TO                            BREAKFAST.                           AVOID ITEMS THAT TRIGGER GASTRITIS.                           Next colonoscopy in 1-3 years.                           - High fiber diet and low fat diet.                           - Continue present medications.                           - Await pathology results. Procedure Code(s):        --- Professional ---                           [phone], Esophagogastroduodenoscopy, flexible,                            transoral; with biopsy, single  or multiple                           99153, Moderate sedation services; each additional                            15 minutes intraservice time                           99153, Moderate sedation services; each additional                            15 minutes intraservice time                           99153, Moderate sedation services; each additional                            15 minutes intraservice time                           G0500, Moderate sedation services provided by the                            same physician or other qualified health care                            professional performing a gastrointestinal                            endoscopic service that sedation supports,                            requiring the presence of an independent trained                            observer to assist in the monitoring of the                            patient's level of consciousness and physiological                            status; initial 15 minutes of intra-service time;                            patient age 21 years or older (additional time may                            be reported with  [phone], as appropriate) Diagnosis Code(s):        --- Professional ---                           K22.2, Esophageal obstruction                           K44.9, Diaphragmatic hernia without obstruction or  gangrene                           K29.70, Gastritis, unspecified, without bleeding                           K26.9, Duodenal ulcer, unspecified as acute or                            chronic, without hemorrhage or perforation                           R19.5, Other fecal abnormalities CPT copyright 2016 [employer]. All rights reserved. The codes documented in this report are preliminary and upon coder review may  be revised to meet current compliance requirements. Barney Drain, MD Barney Drain, MD 01/29/2016 3:39:04 PM This report has been signed electronically. Number of Addenda: 0

## 2016-01-29 NOTE — H&P (Signed)
Primary Care Physician:  Alonza Bogus, MD Primary Gastroenterologist:  Dr. Oneida Alar  Pre-Procedure History & Physical: HPI:  Grant Cardenas is a 60 y.o. male here for HEME POS STOOLS/DYSPEPSIA.  Past Medical History  Diagnosis Date  . CAD (coronary artery disease)     a. anterior ST elevation MI in 09/2015. Cardiac cath showed occluded prox LAD and 60% ostial RCA stenosis. There was also very distal disease affecting the LAD and ramus. He underwent successful angioplasty and drug-eluting stent placement to the LAD. Ejection fraction was 35-40% which improved subsequently an echo to 55-60%.  . Hyperlipidemia   . Tobacco use   . Ischemic cardiomyopathy     a. EF by cath 09/13/2015: 35-40%; b. 09/14/2015: EF 55-60%, Probable akinesis of  the midanteroseptal myocardium. akinesis of the apicalanterior myocardium, GR1DD  . MI (myocardial infarction) ([facility])   . Psoriasis   . Vitreous floaters of both eyes    Past Surgical History  Procedure Laterality Date  . Cardiac catheterization N/A 09/13/2015    Procedure: Left Heart Cath and Coronary Angiography;  Surgeon: Wellington Hampshire, MD;  Location: [facility];  Service: Cardiovascular;  Laterality: N/A;  . Cardiac catheterization N/A 09/13/2015    Procedure: Coronary Stent Intervention;  Surgeon: Wellington Hampshire, MD;  Location: [facility];  Service: Cardiovascular;  Laterality: N/A;  . Cardiac catheterization N/A 11/03/2015    Procedure: Left Heart Cath and Coronary Angiography;  Surgeon: Peter M Martinique, MD;  Location: [facility];  Service: Cardiovascular;  Laterality: N/A;   Prior to Admission medications   Medication Sig Start Date End Date Taking? Authorizing Provider  aspirin EC 81 MG tablet Take 1 tablet (81 mg total) by mouth daily. 09/23/15  Yes Wellington Hampshire, MD  atorvastatin (LIPITOR) 40 MG tablet Take 1 tablet (40 mg total) by mouth daily at 6 PM. 09/25/15  Yes Wellington Hampshire, MD  carvedilol (COREG)  6.25 MG tablet Take 1 tablet (6.25 mg total) by mouth 2 (two) times daily with a meal. 10/29/15  Yes Wellington Hampshire, MD  clobetasol ointment (TEMOVATE) [id number] % Apply 1 application topically 2 (two) times daily.  10/25/15  Yes Historical Provider, MD  folic acid (FOLVITE) 1 MG tablet Take 1 mg by mouth See admin instructions. Takes 1 tab daily except Fridays 08/19/15  Yes Historical Provider, MD  methotrexate (RHEUMATREX) 2.5 MG tablet Take 15 mg by mouth every Friday.  12/18/15  Yes Historical Provider, MD  pantoprazole (PROTONIX) 40 MG tablet Take 40 mg by mouth daily with breakfast. 11/30/15  Yes Historical Provider, MD  prasugrel (EFFIENT) 10 MG TABS tablet Take 1 tablet (10 mg total) by mouth daily. 09/16/15  Yes Bhavinkumar Bhagat, PA  nitroGLYCERIN (NITROSTAT) 0.4 MG SL tablet Place 1 tablet (0.4 mg total) under the tongue every 5 (five) minutes as needed for chest pain. 09/16/15   Leanor Kail, PA    Allergies as of 12/28/2015  . (No Known Allergies)   Family History  Problem Relation Age of Onset  . Cancer Other   . Heart Problems Mother   . Heart attack Father   . Heart disease Father   . Colon cancer Maternal Aunt    Social History   Social History  . Marital Status: Married    Spouse Name: N/A  . Number of Children: N/A  . Years of Education: N/A   Occupational History  . Not on file.   Social History Main Topics  .  Smoking status: Former Smoker -- 40 years    Types: Cigarettes    Quit date: 09/14/2015  . Smokeless tobacco: Never Used  . 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 Drug Use: No  . Sexual Activity: No   Other Topics Concern  . Not on file   Social History Narrative   Review of Systems: See HPI, otherwise negative ROS  Physical Exam: BP 106/78 mmHg  Pulse 55  Temp(Src) 97.4 F (36.3 C) (Oral)  Resp 15  Ht 6\' 5"  (1.956 m)  Wt 185 lb (83.915 kg)  BMI 21.93 kg/m2  SpO2 98% General:   Alert,   pleasant and cooperative in NAD Head:  Normocephalic and atraumatic. Neck:  Supple; Lungs:  Clear throughout to auscultation.    Heart:  Regular rate and rhythm. Abdomen:  Soft, nontender and nondistended. Normal bowel sounds, without guarding, and without rebound.   Neurologic:  Alert and  oriented x4;  grossly normal neurologically.  Impression/Plan:   HEME POS STOOLS/DYSPEPSIA  PLAN:  1.TCS/EGD TODAY

## 2016-01-29 NOTE — Discharge Instructions (Signed)
You have internal hemorrhoids, and HAD 6 polyp removed. ONE WAS LARGER AND I TATTOOED THE BASE AND PLACED A CLIP TO PREVENT BLEEDING IN 7-10 DAYS.  You have A STRICTURE DUE TO REFLUX, HIATAL HERNIA, & gastritis/DUODENITIS DUE TO ASPIRIN USE.I biopsied your stomach.   NO MRI FOR 30 DAYS DUE TO METAL CLIP PLACEMENT IN THE COLON.  FOLLOW A HIGH FIBER/LOW FAT DIET. AVOID ITEMS THAT CAUSE BLOATING. SEE INFO BELOW.  CONTINUE PROTONIX. TAKE 30 MINUTES PRIOR TO BREAKFAST.  AVOID ITEMS THAT TRIGGER GASTRITIS. See info below.  Next colonoscopy in 1-3 years.   ENDOSCOPY Care After Read the instructions outlined below and refer to this sheet in the next week. These discharge instructions provide you with general information on caring for yourself after you leave the hospital. While your treatment has been planned according to the most current medical practices available, unavoidable complications occasionally occur. If you have any problems or questions after discharge, call DR. Halima Fogal, [phone].  ACTIVITY  You may resume your regular activity, but move at a slower pace for the next 24 hours.   Take frequent rest periods for the next 24 hours.   Walking will help get rid of the air and reduce the bloated feeling in your belly (abdomen).   No driving for 24 hours (because of the medicine (anesthesia) used during the test).   You may shower.   Do not sign any important legal documents or operate any machinery for 24 hours (because of the anesthesia used during the test).    NUTRITION  Drink plenty of fluids.   You may resume your normal diet as instructed by your doctor.   Begin with a light meal and progress to your normal diet. Heavy or fried foods are harder to digest and may make you feel sick to your stomach (nauseated).   Avoid alcoholic beverages for 24 hours or as instructed.    MEDICATIONS  You may resume your normal medications.   WHAT YOU CAN EXPECT TODAY  Some  feelings of bloating in the abdomen.   Passage of more gas than usual.   Spotting of blood in your stool or on the toilet paper  .  IF YOU HAD POLYPS REMOVED DURING THE ENDOSCOPY:  Eat a soft diet IF YOU HAVE NAUSEA, BLOATING, ABDOMINAL PAIN, OR VOMITING.    FINDING OUT THE RESULTS OF YOUR TEST Not all test results are available during your visit. DR. Oneida Alar WILL CALL YOU WITHIN 14 DAYS OF YOUR PROCEDUE WITH YOUR RESULTS. Do not assume everything is normal if you have not heard from DR. Zale Marcotte, CALL HER OFFICE AT [phone].  SEEK IMMEDIATE MEDICAL ATTENTION AND CALL THE OFFICE: [phone] IF:  You have more than a spotting of blood in your stool.   Your belly is swollen (abdominal distention).   You are nauseated or vomiting.   You have a temperature over 101F.   You have abdominal pain or discomfort that is severe or gets worse throughout the day.   Polyps, Colon  A polyp is extra tissue that grows inside your body. Colon polyps grow in the large intestine. The large intestine, also called the colon, is part of your digestive system. It is a long, hollow tube at the end of your digestive tract where your body makes and stores stool. Most polyps are not dangerous. They are benign. This means they are not cancerous. But over time, some types of polyps can turn into cancer. Polyps that are smaller than a  pea are usually not harmful. But larger polyps could someday become or may already be cancerous. To be safe, doctors remove all polyps and test them.   PREVENTION There is not one sure way to prevent polyps. You might be able to lower your risk of getting them if you:  Eat more fruits and vegetables and less fatty food.   Do not smoke.   Avoid alcohol.   Exercise every day.   Lose weight if you are overweight.   Eating more calcium and folate can also lower your risk of getting polyps. Some foods that are rich in calcium are milk, cheese, and broccoli. Some foods that  are rich in folate are chickpeas, kidney beans, and spinach.    Gastritis/DUODENITIS  Gastritis is an inflammation (the body's way of reacting to injury and/or infection) of the stomach. DUODENITIS is an inflammation (the body's way of reacting to injury and/or infection) of the FIRST PART OF THE SMALL INTESTINES. It is often caused by bacterial (germ) infections. It can also be caused BY ASPIRIN, BC/GOODY POWDER'S, (IBUPROFEN) MOTRIN, OR ALEVE (NAPROXEN), chemicals (including alcohol), SPICY FOODS, and medications. This illness may be associated with generalized malaise (feeling tired, not well), UPPER ABDOMINAL STOMACH cramps, and fever. One common bacterial cause of gastritis is an organism known as H. Pylori. This can be treated with antibiotics.    High-Fiber Diet A high-fiber diet changes your normal diet to include more whole grains, legumes, fruits, and vegetables. Changes in the diet involve replacing refined carbohydrates with unrefined foods. The calorie level of the diet is essentially unchanged. The Dietary Reference Intake (recommended amount) for adult males is 38 grams per day. For adult females, it is 25 grams per day. Pregnant and lactating women should consume 28 grams of fiber per day. Fiber is the intact part of a plant that is not broken down during digestion. Functional fiber is fiber that has been isolated from the plant to provide a beneficial effect in the body. PURPOSE  Increase stool bulk.   Ease and regulate bowel movements.   Lower cholesterol.  REDUCE RISK OF COLON CANCER  INDICATIONS THAT YOU NEED MORE FIBER  Constipation and hemorrhoids.   Uncomplicated diverticulosis (intestine condition) and irritable bowel syndrome.   Weight management.   As a protective measure against hardening of the arteries (atherosclerosis), diabetes, and cancer.   GUIDELINES FOR INCREASING FIBER IN THE DIET  Start adding fiber to the diet slowly. A gradual increase of about 5  more grams (2 slices of whole-wheat bread, 2 servings of most fruits or vegetables, or 1 bowl of high-fiber cereal) per day is best. Too rapid an increase in fiber may result in constipation, flatulence, and bloating.   Drink enough water and fluids to keep your urine clear or pale yellow. Water, juice, or caffeine-free drinks are recommended. Not drinking enough fluid may cause constipation.   Eat a variety of high-fiber foods rather than one type of fiber.   Try to increase your intake of fiber through using high-fiber foods rather than fiber pills or supplements that contain small amounts of fiber.   The goal is to change the types of food eaten. Do not supplement your present diet with high-fiber foods, but replace foods in your present diet.   INCLUDE A VARIETY OF FIBER SOURCES  Replace refined and processed grains with whole grains, canned fruits with fresh fruits, and incorporate other fiber sources. White rice, white breads, and most bakery goods contain little or no  fiberOwens Shark whole-grain rice, buckwheat oats, and many fruits and vegetables are all good sources of fiber. These include: broccoli, Brussels sprouts, cabbage, cauliflower, beets, sweet potatoes, white potatoes (skin on), carrots, tomatoes, eggplant, squash, berries, fresh fruits, and dried fruits.   Cereals appear to be the richest source of fiber. Cereal fiber is found in whole grains and bran. Bran is the fiber-rich outer coat of cereal grain, which is largely removed in refining. In whole-grain cereals, the bran remains. In breakfast cereals, the largest amount of fiber is found in those with "bran" in their names. The fiber content is sometimes indicated on the label.   You may need to include additional fruits and vegetables each day.   In baking, for 1 cup white flour, you may use the following substitutions:   1 cup whole-wheat flour minus 2 tablespoons.   1/2 cup white flour plus 1/2 cup whole-wheat flour.    Low-Fat Diet BREADS, CEREALS, PASTA, RICE, DRIED PEAS, AND BEANS These products are high in carbohydrates and most are low in fat. Therefore, they can be increased in the diet as substitutes for fatty foods. They too, however, contain calories and should not be eaten in excess. Cereals can be eaten for snacks as well as for breakfast.  Include foods that contain fiber (fruits, vegetables, whole grains, and legumes). Research shows that fiber may lower blood cholesterol levels, especially the water-soluble fiber found in fruits, vegetables, oat products, and legumes. FRUITS AND VEGETABLES It is good to eat fruits and vegetables. Besides being sources of fiber, both are rich in vitamins and some minerals. They help you get the daily allowances of these nutrients. Fruits and vegetables can be used for snacks and desserts. MEATS Limit lean meat, chicken, [place], and fish to no more than 6 ounces per day. Beef, Pork, and Lamb Use lean cuts of beef, pork, and lamb. Lean cuts include:  Extra-lean ground beef.  Arm roast.  Sirloin tip.  Center-cut ham.  Round steak.  Loin chops.  Rump roast.  Tenderloin.  Trim all fat off the outside of meats before cooking. It is not necessary to severely decrease the intake of red meat, but lean choices should be made. Lean meat is rich in protein and contains a highly absorbable form of iron. Premenopausal women, in particular, should avoid reducing lean red meat because this could increase the risk for low red blood cells (iron-deficiency anemia). The organ meats, such as liver, sweetbreads, kidneys, and brain are very rich in cholesterol. They should be limited. Chicken and [place] These are good sources of protein. The fat of poultry can be reduced by removing the skin and underlying fat layers before cooking. Chicken and [place] can be substituted for lean red meat in the diet. Poultry should not be fried or covered with high-fat sauces. Fish and  Shellfish Fish is a good source of protein. Shellfish contain cholesterol, but they usually are low in saturated fatty acids. The preparation of fish is important. Like chicken and [place], they should not be fried or covered with high-fat sauces. EGGS Egg whites contain no fat or cholesterol. They can be eaten often. Try 1 to 2 egg whites instead of whole eggs in recipes or use egg substitutes that do not contain yolk. MILK AND DAIRY PRODUCTS Use skim or 1% milk instead of 2% or whole milk. Decrease whole milk, natural, and processed cheeses. Use nonfat or low-fat (2%) cottage cheese or low-fat cheeses made from vegetable oils. Choose nonfat  or low-fat (1 to 2%) yogurt. Experiment with evaporated skim milk in recipes that call for heavy cream. Substitute low-fat yogurt or low-fat cottage cheese for sour cream in dips and salad dressings. Have at least 2 servings of low-fat dairy products, such as 2 glasses of skim (or 1%) milk each day to help get your daily calcium intake.  FATS AND OILS Reduce the total intake of fats, especially saturated fat. Butterfat, lard, and beef fats are high in saturated fat and cholesterol. These should be avoided as much as possible. Vegetable fats do not contain cholesterol, but certain vegetable fats, such as coconut oil, palm oil, and palm kernel oil are very high in saturated fats. These should be limited. These fats are often used in bakery goods, processed foods, popcorn, oils, and nondairy creamers. Vegetable shortenings and some peanut butters contain hydrogenated oils, which are also saturated fats. Read the labels on these foods and check for saturated vegetable oils. Unsaturated vegetable oils and fats do not raise blood cholesterol. However, they should be limited because they are fats and are high in calories. Total fat should still be limited to 30% of your daily caloric intake. Desirable liquid vegetable oils are corn oil, cottonseed oil, olive oil, canola oil,  safflower oil, soybean oil, and sunflower oil. Peanut oil is not as good, but small amounts are acceptable. Buy a heart-healthy tub margarine that has no partially hydrogenated oils in the ingredients. Mayonnaise and salad dressings often are made from unsaturated fats, but they should also be limited because of their high calorie and fat content. Seeds, nuts, peanut butter, olives, and avocados are high in fat, but the fat is mainly the unsaturated type. These foods should be limited mainly to avoid excess calories and fat. OTHER EATING TIPS Snacks  Most sweets should be limited as snacks. They tend to be rich in calories and fats, and their caloric content outweighs their nutritional value. Some good choices in snacks are graham crackers, melba toast, soda crackers, bagels (no egg), English muffins, fruits, and vegetables. These snacks are preferable to snack crackers, [place] fries, and chips. Popcorn should be air-popped or cooked in small amounts of liquid vegetable oil. Desserts Eat fruit, low-fat yogurt, and fruit ices. AVOID pastries, cake, and cookies. Sherbet, angel food cake, gelatin dessert, frozen low-fat yogurt, or other frozen products that do not contain saturated fat (pure fruit juice bars, frozen ice pops) are also acceptable.  COOKING METHODS Choose those methods that use little or no fat. They include: Poaching.  Braising.  Steaming.  Grilling.  Baking.  Stir-frying.  Broiling.  Microwaving.  Foods can be cooked in a nonstick pan without added fat, or use a nonfat cooking spray in regular cookware. Limit fried foods and avoid frying in saturated fat. Add moisture to lean meats by using water, broth, cooking wines, and other nonfat or low-fat sauces along with the cooking methods mentioned above. Soups and stews should be chilled after cooking. The fat that forms on top after a few hours in the refrigerator should be skimmed off. When preparing meals, avoid using excess salt. Salt  can contribute to raising blood pressure in some people. EATING AWAY FROM HOME Order entres, potatoes, and vegetables without sauces or butter. When meat exceeds the size of a deck of cards (3 to 4 ounces), the rest can be taken home for another meal. Choose vegetable or fruit salads and ask for low-calorie salad dressings to be served on the side. Use dressings sparingly. Limit  high-fat toppings, such as bacon, crumbled eggs, cheese, sunflower seeds, and olives. Ask for heart-healthy tub margarine instead of butter.   Hemorrhoids Hemorrhoids are dilated (enlarged) veins around the rectum. Sometimes clots will form in the veins. This makes them swollen and painful. These are called thrombosed hemorrhoids. Causes of hemorrhoids include:  Constipation.   Straining to have a bowel movement.   HEAVY LIFTING  HOME CARE INSTRUCTIONS  Eat a well balanced diet and drink 6 to 8 glasses of water every day to avoid constipation. You may also use a bulk laxative.   Avoid straining to have bowel movements.   Keep anal area dry and clean.   Do not use a donut shaped pillow or sit on the toilet for long periods. This increases blood pooling and pain.   Move your bowels when your body has the urge; this will require less straining and will decrease pain and pressure.

## 2016-01-29 NOTE — Progress Notes (Signed)
Cardiac Individual Treatment Plan  Patient Details  Name: Grant Cardenas MRN: [id number] Date of Birth: 08-15-1956 Referring Provider:    Initial Encounter Date:       CARDIAC REHAB PHASE II ORIENTATION from 12/22/2015 in [place]   Date  12/22/15      Visit Diagnosis: No diagnosis found.  Patient's Home Medications on Admission:  Current outpatient prescriptions:  .  aspirin EC 81 MG tablet, Take 1 tablet (81 mg total) by mouth daily., Disp: 90 tablet, Rfl: 3 .  atorvastatin (LIPITOR) 40 MG tablet, Take 1 tablet (40 mg total) by mouth daily at 6 PM., Disp: 30 tablet, Rfl: 6 .  carvedilol (COREG) 6.25 MG tablet, Take 1 tablet (6.25 mg total) by mouth 2 (two) times daily with a meal., Disp: 30 tablet, Rfl: 1 .  clobetasol ointment (TEMOVATE) [id number] %, Apply 1 application topically 2 (two) times daily. , Disp: , Rfl: 1 .  folic acid (FOLVITE) 1 MG tablet, Take 1 mg by mouth See admin instructions. Takes 1 tab daily except Fridays, Disp: , Rfl: 3 .  methotrexate (RHEUMATREX) 2.5 MG tablet, Take 15 mg by mouth every Friday. , Disp: , Rfl:  .  nitroGLYCERIN (NITROSTAT) 0.4 MG SL tablet, Place 1 tablet (0.4 mg total) under the tongue every 5 (five) minutes as needed for chest pain., Disp: 25 tablet, Rfl: 12 .  pantoprazole (PROTONIX) 40 MG tablet, Take 40 mg by mouth daily with breakfast., Disp: , Rfl: 5 .  prasugrel (EFFIENT) 10 MG TABS tablet, Take 1 tablet (10 mg total) by mouth daily., Disp: 30 tablet, Rfl: 6 No current facility-administered medications for this encounter.  Facility-Administered Medications Ordered in Other Encounters:  .  0.9 %  sodium chloride infusion, , Intravenous, Continuous, Danie Binder, MD, Last Rate: 20 mL/hr at 01/29/16 1134, 1,000 mL at 01/29/16 1134 .  EPINEPHrine (ADRENALIN) 0.1 MG/ML injection, , , ,  .  lidocaine (XYLOCAINE) 2 % viscous mouth solution, , , ,  .  meperidine (DEMEROL) 100 MG/ML injection, , , ,  .  midazolam  (VERSED) 5 MG/5ML injection, , , ,  .  promethazine (PHENERGAN) 25 MG/ML injection, , , ,  .  sodium chloride flush (NS) 0.9 % injection, , , ,   Past Medical History: Past Medical History  Diagnosis Date  . CAD (coronary artery disease)     a. anterior ST elevation MI in 09/2015. Cardiac cath showed occluded prox LAD and 60% ostial RCA stenosis. There was also very distal disease affecting the LAD and ramus. He underwent successful angioplasty and drug-eluting stent placement to the LAD. Ejection fraction was 35-40% which improved subsequently an echo to 55-60%.  . Hyperlipidemia   . Tobacco use   . Ischemic cardiomyopathy     a. EF by cath 09/13/2015: 35-40%; b. 09/14/2015: EF 55-60%, Probable akinesis of  the midanteroseptal myocardium. akinesis of the apicalanterior myocardium, GR1DD  . MI (myocardial infarction) ([place])   . Psoriasis   . Vitreous floaters of both eyes     Tobacco Use: History  Smoking status  . Former Smoker -- 50 years  . Types: Cigarettes  . Quit date: 09/14/2015  Smokeless tobacco  . Never Used    Labs:     Recent Review Flowsheet Data    Labs for ITP Cardiac and Pulmonary Rehab Latest Ref Rng 09/13/2015 09/14/2015 09/23/2015 11/26/2015   Cholestrol 0 - 200 mg/dL - 271(H) 212(H) 153   LDLCALC 0 - 99  mg/dL - 208(H) 137(H) 95   HDL >40 mg/dL - 40(L) 41 41   Trlycerides <150 mg/dL - 116 171(H) 85   TCO2 0 - 100 mmol/L 26 - - -      Capillary Blood Glucose: No results found for: GLUCAP   Exercise Target Goals:    Exercise Program Goal: Individual exercise prescription set with THRR, safety & activity barriers. Participant demonstrates ability to understand and report RPE using BORG scale, to self-measure pulse accurately, and to acknowledge the importance of the exercise prescription.  Exercise Prescription Goal: Starting with aerobic activity 30 plus minutes a day, 3 days per week for initial exercise prescription. Provide home exercise  prescription and guidelines that participant acknowledges understanding prior to discharge.  Activity Barriers & Risk Stratification:     Activity Barriers & Cardiac Risk Stratification - 12/22/15 1558    Activity Barriers & Cardiac Risk Stratification   Activity Barriers None   Cardiac Risk Stratification High      6 Minute Walk:     6 Minute Walk      12/22/15 1506       6 Minute Walk   Phase Initial     Distance 1350 feet     Walk Time 6 minutes     # of Rest Breaks 0     MPH 2.56     METS 2.96     RPE 11     Perceived Dyspnea  13     VO2 Peak 12.54     Symptoms No     Resting HR 73 bpm     Resting BP 108/60 mmHg     Max Ex. HR 88 bpm     Max Ex. BP 120/72 mmHg     2 Minute Post BP 106/68 mmHg     Pre/Post BP   Baseline BP 108/60 mmHg     6 Minute BP 120/72 mmHg     Pre/Post BP? Yes        Initial Exercise Prescription:     Initial Exercise Prescription - 12/22/15 1500    Date of Initial Exercise RX and Referring Provider   Date 12/22/15   Treadmill   MPH 1.8   Grade 0   Minutes 15   METs 2.38   NuStep   Level 2   Minutes 15   METs 1.5   Prescription Details   Frequency (times per week) 3   Intensity   THRR REST +  30   THRR 40-80% of Max Heartrate 107-141   Ratings of Perceived Exertion 11-13   Progression   Progression Continue to progress workloads to maintain intensity without signs/symptoms of physical distress.   Resistance Training   Training Prescription Yes   Weight 1.0   Reps 10-12      Perform Capillary Blood Glucose checks as needed.  Exercise Prescription Changes:      Exercise Prescription Changes      12/28/15 1300 01/29/16 1400         Exercise Review   Progression Yes Yes      Response to Exercise   Blood Pressure (Admit) 96/64 mmHg 100/50 mmHg      Blood Pressure (Exercise) 140/66 mmHg 92/60 mmHg      Blood Pressure (Exit) 100/60 mmHg 100/68 mmHg      Heart Rate (Admit) 67 bpm 73 bpm      Heart Rate  (Exercise) 81 bpm 87 bpm      Heart Rate (Exit) 64 bpm  79 bpm      Rating of Perceived Exertion (Exercise) 11 11      Symptoms No No      Progression   Progression Continue to progress workloads to maintain intensity without signs/symptoms of physical distress. Continue to progress workloads to maintain intensity without signs/symptoms of physical distress.      Resistance Training   Training Prescription Yes Yes      Weight 1.0 3.0      Reps 10-12 10-12      Interval Training   Interval Training No No      Treadmill   MPH 1.5 2.5      Grade 0 0      Minutes 15 15      METs 2.1 2.91      NuStep   Level 2 2      Minutes 15 15      METs 3.58 1.9      Home Exercise Plan   Plans to continue exercise at  Home      Frequency  Add 2 additional days to program exercise sessions.         Exercise Comments:      Exercise Comments      01/29/16 1435           Exercise Comments Patient is progressing appropriately.           Discharge Exercise Prescription (Final Exercise Prescription Changes):     Exercise Prescription Changes - 01/29/16 1400    Exercise Review   Progression Yes   Response to Exercise   Blood Pressure (Admit) 100/50 mmHg   Blood Pressure (Exercise) 92/60 mmHg   Blood Pressure (Exit) 100/68 mmHg   Heart Rate (Admit) 73 bpm   Heart Rate (Exercise) 87 bpm   Heart Rate (Exit) 79 bpm   Rating of Perceived Exertion (Exercise) 11   Symptoms No   Progression   Progression Continue to progress workloads to maintain intensity without signs/symptoms of physical distress.   Resistance Training   Training Prescription Yes   Weight 3.0   Reps 10-12   Interval Training   Interval Training No   Treadmill   MPH 2.5   Grade 0   Minutes 15   METs 2.91   NuStep   Level 2   Minutes 15   METs 1.9   Home Exercise Plan   Plans to continue exercise at Home   Frequency Add 2 additional days to program exercise sessions.      Nutrition:  Target Goals:  Understanding of nutrition guidelines, daily intake of sodium 1500mg , cholesterol 200mg , calories 30% from fat and 7% or less from saturated fats, daily to have 5 or more servings of fruits and vegetables.  Biometrics:     Pre Biometrics - 12/22/15 1537    Pre Biometrics   Height 6\' 1"  (1.854 m)   Weight 186 lb 8 oz (84.596 kg)   Waist Circumference 35 inches   Hip Circumference 38 inches   Waist to Hip Ratio 0.92 %   BMI (Calculated) 24.7   Triceps Skinfold 9 mm   % Body Fat 21.3 %   Grip Strength 100 kg   Flexibility 12 in   Single Leg Stand 60 seconds       Nutrition Therapy Plan and Nutrition Goals:     Nutrition Therapy & Goals - 12/22/15 1601    Intervention Plan   Intervention Nutrition handout(s) given to patient.   Expected Outcomes Short  Term Goal: Understand basic principles of dietary content, such as calories, fat, sodium, cholesterol and nutrients.      Nutrition Discharge: Rate Your Plate Scores:     Nutrition Assessments - 12/22/15 1601    MEDFICTS Scores   Pre Score 6      Nutrition Goals Re-Evaluation:     Nutrition Goals Re-Evaluation      01/29/16 0839           Intervention Plan   Intervention Continue to educate, counsel and set short/long term goals regarding individualized specific personal dietary modifications.          Psychosocial: Target Goals: Acknowledge presence or absence of depression, maximize coping skills, provide positive support system. Participant is able to verbalize types and ability to use techniques and skills needed for reducing stress and depression.  Initial Review & Psychosocial Screening:     Initial Psych Review & Screening - 12/22/15 1602    Initial Review   Current issues with Current Stress Concerns   Source of Stress Concerns --  not being able to eat what he wants and wants a cigarette   [place]? Yes   Barriers   Psychosocial barriers to participate in program  There are no identifiable barriers or psychosocial needs.   Screening Interventions   Interventions Encouraged to exercise      Quality of Life Scores:     Quality of Life - 12/22/15 1539    Quality of Life Scores   Health/Function Pre 10.03 %   Socioeconomic Pre 19.86 %   Psych/Spiritual Pre 14.93 %   Family Pre 12.88 %   GLOBAL Pre 13.5 %      PHQ-9:     Recent Review Flowsheet Data    Depression screen [facility] 2/9 12/22/2015   Decreased Interest 0   Down, Depressed, Hopeless 0   PHQ - 2 Score 0   Altered sleeping 0   Tired, decreased energy 0   Change in appetite 0   Feeling bad or failure about yourself  0   Trouble concentrating 0   Moving slowly or fidgety/restless 0   Suicidal thoughts 0   PHQ-9 Score 0   Difficult doing work/chores Not difficult at all      Psychosocial Evaluation and Intervention:     Psychosocial Evaluation - 12/22/15 1603    Psychosocial Evaluation & Interventions   Interventions Encouraged to exercise with the program and follow exercise prescription   Continued Psychosocial Services Needed No      Psychosocial Re-Evaluation:     Psychosocial Re-Evaluation      01/29/16 0842           Psychosocial Re-Evaluation   Interventions Encouraged to attend Cardiac Rehabilitation for the exercise       Continued Psychosocial Services Needed No          Vocational Rehabilitation: Provide vocational rehab assistance to qualifying candidates.   Vocational Rehab Evaluation & Intervention:     Vocational Rehab - 12/22/15 1559    Initial Vocational Rehab Evaluation & Intervention   Assessment shows need for Vocational Rehabilitation No      Education: Education Goals: Education classes will be provided on a weekly basis, covering required topics. Participant will state understanding/return demonstration of topics presented.  Learning Barriers/Preferences:     Learning Barriers/Preferences - 12/22/15 1559    Learning  Barriers/Preferences   Learning Preferences Skilled Demonstration      Education Topics: Hypertension, Hypertension Reduction -  Define heart disease and high blood pressure. Discus how high blood pressure affects the body and ways to reduce high blood pressure.   Exercise and Your Heart -Discuss why it is important to exercise, the FITT principles of exercise, normal and abnormal responses to exercise, and how to exercise safely.   Angina -Discuss definition of angina, causes of angina, treatment of angina, and how to decrease risk of having angina.   Cardiac Medications -Review what the following cardiac medications are used for, how they affect the body, and side effects that may occur when taking the medications.  Medications include Aspirin, Beta blockers, calcium channel blockers, ACE Inhibitors, angiotensin receptor blockers, diuretics, digoxin, and antihyperlipidemics.   Congestive Heart Failure -Discuss the definition of CHF, how to live with CHF, the signs and symptoms of CHF, and how keep track of weight and sodium intake.   Heart Disease and Intimacy -Discus the effect sexual activity has on the heart, how changes occur during intimacy as we age, and safety during sexual activity.   Smoking Cessation / COPD -Discuss different methods to quit smoking, the health benefits of quitting smoking, and the definition of COPD.   Nutrition I: Fats -Discuss the types of cholesterol, what cholesterol does to the heart, and how cholesterol levels can be controlled.   Nutrition II: Labels -Discuss the different components of food labels and how to read food label      CARDIAC REHAB PHASE II EXERCISE from 01/27/2016 in [place]   Date  12/30/15   Educator  Russella Dar   Instruction Review Code  2- meets goals/outcomes      Heart Parts and Heart Disease -Discuss the anatomy of the heart, the pathway of blood circulation through the heart, and these are  affected by heart disease.      CARDIAC REHAB PHASE II EXERCISE from 01/27/2016 in [place]   Date  01/06/16   Educator  -- Shauna Hugh Coad]   Instruction Review Code  2- meets goals/outcomes      Stress I: Signs and Symptoms -Discuss the causes of stress, how stress may lead to anxiety and depression, and ways to limit stress.      CARDIAC REHAB PHASE II EXERCISE from 01/27/2016 in [place]   Date  01/13/16   Educator  D Coad   Instruction Review Code  2- meets goals/outcomes      Stress II: Relaxation -Discuss different types of relaxation techniques to limit stress.      CARDIAC REHAB PHASE II EXERCISE from 01/27/2016 in [place]   Date  01/20/16   Educator  D Coad   Instruction Review Code  2- meets goals/outcomes      Warning Signs of Stroke / TIA -Discuss definition of a stroke, what the signs and symptoms are of a stroke, and how to identify when someone is having stroke.          CARDIAC REHAB PHASE II EXERCISE from 01/27/2016 in [place]   Date  01/27/16   Educator  Russella Dar   Instruction Review Code  2- meets goals/outcomes      Knowledge Questionnaire Score:     Knowledge Questionnaire Score - 12/22/15 1559    Knowledge Questionnaire Score   Pre Score 17/24      Core Components/Risk Factors/Patient Goals at Admission:     Personal Goals and Risk Factors at Admission - 12/22/15 1601    Core Components/Risk  Factors/Patient Goals on Admission   Improve shortness of breath with ADL's Yes   Intervention Provide education, individualized exercise plan and daily activity instruction to help decrease symptoms of SOB with activities of daily living.   Expected Outcomes Short Term: Achieves a reduction of symptoms when performing activities of daily living.   Develop more efficient breathing techniques such as purse lipped breathing and diaphragmatic breathing; and  practicing self-pacing with activity Yes   Intervention Provide education, demonstration and support about specific breathing techniuqes utilized for more efficient breathing. Include techniques such as pursed lipped breathing, diaphragmatic breathing and self-pacing activity.   Expected Outcomes Short Term: Participant will be able to demonstrate and use breathing techniques as needed throughout daily activities.   Hypertension Yes   Intervention Monitor prescription use compliance.;Provide education on lifestyle modifcations including regular physical activity/exercise, weight management, moderate sodium restriction and increased consumption of fresh fruit, vegetables, and low fat dairy, alcohol moderation, and smoking cessation.   Expected Outcomes Long Term: Maintenance of blood pressure at goal levels.   Lipids Yes   Intervention Provide education and support for participant on nutrition & aerobic/resistive exercise along with prescribed medications to achieve LDL 70mg , HDL >40mg .   Expected Outcomes Long Term: Cholesterol controlled with medications as prescribed, with individualized exercise RX and with personalized nutrition plan. Value goals: LDL < 70mg , HDL > 40 mg.      Core Components/Risk Factors/Patient Goals Review:      Goals and Risk Factor Review      01/29/16 0840           Core Components/Risk Factors/Patient Goals Review   Personal Goals Review Improve shortness of breath with ADL's;Hypertension;Develop more efficient breathing techniques such as purse lipped breathing and diaphragmatic breathing and practicing self-pacing with activity.;Lipids       Review BP has been controlled during program.  patient stated he is breathing a little better.        Expected Outcomes increase energy, play golf again.           Core Components/Risk Factors/Patient Goals at Discharge (Final Review):      Goals and Risk Factor Review - 01/29/16 0840    Core Components/Risk  Factors/Patient Goals Review   Personal Goals Review Improve shortness of breath with ADL's;Hypertension;Develop more efficient breathing techniques such as purse lipped breathing and diaphragmatic breathing and practicing self-pacing with activity.;Lipids   Review BP has been controlled during program.  patient stated he is breathing a little better.    Expected Outcomes increase energy, play golf again.       ITP Comments:     ITP Comments      12/22/15 1559           ITP Comments Patient is a 60 year old man that lives with spouse.  Patient denies depression but stressed he cant eat what he wants and smoke a cigarette. Enjoys golf.           Comments: Patient is expected to progress towards personal goals after completing sessions. Recommend continued exercise, lifestyle modifications, education and utilization of home exercise plan to increase stamina and strength.

## 2016-01-29 NOTE — Op Note (Signed)
[facility] Patient Name: Grant Cardenas Procedure Date: 01/29/2016 12:34 PM MRN: [id number] Date of Birth: 1956/07/23 Attending MD: Barney Drain , MD CSN: [id number] Age: 60 Admit Type: Outpatient Procedure:                Colonoscopy Indications:              Heme positive stool Providers:                Barney Drain, MD, Rosina Lowenstein, RN, Georgeann Oppenheim,                            Technician Referring MD:              Medicines:                Promethazine 25 mg IV, Meperidine 50 mg IV,                            Midazolam 3 mg IV Complications:            No immediate complications. Estimated Blood Loss:     Estimated blood loss was minimal. Procedure:                Pre-Anesthesia Assessment:                           - Prior to the procedure, a History and Physical                            was performed, and patient medications and                            allergies were reviewed. The patient's tolerance of                            previous anesthesia was also reviewed. The risks                            and benefits of the procedure and the sedation                            options and risks were discussed with the patient.                            All questions were answered, and informed consent                            was obtained. Prior Anticoagulants: The patient has                            taken aspirin, last dose was day of procedure. ASA                            Grade Assessment: II - A patient with mild systemic  disease. After reviewing the risks and benefits,                            the patient was deemed in satisfactory condition to                            undergo the procedure.                           After obtaining informed consent, the colonoscope                            was passed under direct vision. Throughout the                            procedure, the patient's blood pressure, pulse, and                          oxygen saturations were monitored continuously. The                            [id number] scope was introduced through                            the anus and advanced to the the terminal ileum.                            The terminal ileum, ileocecal valve, appendiceal                            orifice, and rectum were photographed. The                            colonoscopy was performed without difficulty. The                            patient tolerated the procedure well. The quality                            of the bowel preparation was excellent. Scope In: 12:44:24 PM Scope Out: 1:09:31 PM Scope Withdrawal Time: 0 hours 23 minutes 28 seconds  Total Procedure Duration: 0 hours 25 minutes 7 seconds  Findings:      The digital rectal exam findings include non-thrombosed external       hemorrhoids.      Two pedunculated and sessile polyps were found in the rectum and       transverse colon. The polyps were 9 to 12 mm in size. Area was tattooed       with an injection of 2 mL of Spot (carbon black). To prevent bleeding       post-intervention, one hemostatic clip was successfully placed. There       was no bleeding at the end of the procedure.      Four sessile polyps were found in the rectum and sigmoid colon. The       polyps were 2 to 5 mm  in size. These polyps were removed with a cold       biopsy forceps. Resection and retrieval were complete.      The terminal ileum appeared normal. Impression:               - Non-thrombosed external hemorrhoids found on                            digital rectal exam.                           - Two 9 to 12 mm polyps in the rectum and in the                            transverse colon, removed with a hot snare.                            Resected and retrieved. Tattooed. Clip was placed.                           - Four 2 to 5 mm polyps in the rectum and in the                            sigmoid colon, removed  with a cold biopsy forceps.                            Resected and retrieved.                           - The examined portion of the ileum was normal.                           HEME POSITIVE STOOLS DUE TO                            GASTRITIS/DUODENITIS/LARGE RECTAL POLYP Moderate Sedation:      Moderate (conscious) sedation was administered by the endoscopy nurse       and supervised by the endoscopist. The following parameters were       monitored: oxygen saturation, heart rate, blood pressure, and response       to care. Total physician intraservice time was 55 minutes. Recommendation:           - Patient has a contact number available for                            emergencies. The signs and symptoms of potential                            delayed complications were discussed with the                            patient. Return to normal activities tomorrow.                            Written discharge  instructions were provided to the                            patient.                           - High fiber diet and low fat diet.                           - Continue present medications.                           - Await pathology results.                           - Repeat colonoscopy in 1-3 year for surveillance.                           NO MRI FOR 30 DAYS DUE TO METAL CLIP PLACEMENT IN                            THE COLON.                           CONTINUE PROTONIX. TAKE 30 MINUTES PRIOR TO                            BREAKFAST.                           AVOID ITEMS THAT TRIGGER GASTRITIS. Procedure Code(s):        --- Professional ---                           [phone], Colonoscopy, flexible; with removal of                            tumor(s), polyp(s), or other lesion(s) by snare                            technique                           45381, Colonoscopy, flexible; with directed                            submucosal injection(s), any substance                            [id number], 59, Colonoscopy, flexible; with biopsy,                            single or multiple                           99153, Moderate sedation services; each additional  15 minutes intraservice time                           99153, Moderate sedation services; each additional                            15 minutes intraservice time                           99153, Moderate sedation services; each additional                            15 minutes intraservice time                           G0500, Moderate sedation services provided by the                            same physician or other qualified health care                            professional performing a gastrointestinal                            endoscopic service that sedation supports,                            requiring the presence of an independent trained                            observer to assist in the monitoring of the                            patient's level of consciousness and physiological                            status; initial 15 minutes of intra-service time;                            patient age 59 years or older (additional time may                            be reported with [phone], as appropriate) Diagnosis Code(s):        --- Professional ---                           D12.3, Benign neoplasm of transverse colon (hepatic                            flexure or splenic flexure)                           K62.1, Rectal polyp                           D12.5, Benign neoplasm of  sigmoid colon                           K64.4, Residual hemorrhoidal skin tags                           R19.5, Other fecal abnormalities CPT copyright 2016 American Medical Association. All rights reserved. The codes documented in this report are preliminary and upon coder review may  be revised to meet current compliance requirements. Barney Drain, MD Barney Drain, MD 01/29/2016 3:49:51 PM This report has  been signed electronically. Number of Addenda: 0

## 2016-02-01 ENCOUNTER — Encounter (HOSPITAL_COMMUNITY)
Admission: RE | Admit: 2016-02-01 | Discharge: 2016-02-01 | Disposition: A | Discharge status: Home / Self Care | Payer: No Typology Code available for payment source | Source: Ambulatory Visit | Attending: Family Medicine | Admitting: Family Medicine

## 2016-02-01 DIAGNOSIS — I213 ST elevation (STEMI) myocardial infarction of unspecified site: Secondary | ICD-10-CM | POA: Diagnosis not present

## 2016-02-02 ENCOUNTER — Telehealth: Payer: Self-pay

## 2016-02-02 ENCOUNTER — Telehealth: Payer: Self-pay | Admitting: Cardiovascular Disease

## 2016-02-02 NOTE — Telephone Encounter (Signed)
Received a request from [facility] to do PA for Effient for patient. Apparently he was trying to use a co/pay card for this. I spoke with the pharmacist about this. Co pay card is void without Insurance coverage. Found out patient was recently terminated from his job, and therefore has no Insurance underwriter.Marland Kitchen He and his wife are going to check out Assistance programs. I will touch base with him today also.

## 2016-02-02 NOTE — Telephone Encounter (Signed)
LMOM to call the office regarding Effient; there is patient assistance with Effient if he qualifies.

## 2016-02-02 NOTE — Telephone Encounter (Signed)
Pt wife calling stating we gave patient a coupon for Effient so he may get prescriptions for free at pharmacy But now [facility] and [facility] won't use that coupon anymore Would like some advise or another option for patient  Please advise.

## 2016-02-03 ENCOUNTER — Encounter (HOSPITAL_COMMUNITY): Payer: Self-pay | Admitting: Gastroenterology

## 2016-02-03 ENCOUNTER — Encounter (HOSPITAL_COMMUNITY)
Admission: RE | Admit: 2016-02-03 | Discharge: 2016-02-03 | Disposition: A | Discharge status: Home / Self Care | Payer: No Typology Code available for payment source | Source: Ambulatory Visit | Attending: Family Medicine | Admitting: Family Medicine

## 2016-02-03 DIAGNOSIS — I213 ST elevation (STEMI) myocardial infarction of unspecified site: Secondary | ICD-10-CM | POA: Diagnosis not present

## 2016-02-03 NOTE — Telephone Encounter (Signed)
Switch Effient to Plavix 75 mg once daily.

## 2016-02-03 NOTE — Telephone Encounter (Signed)
The patient can't afford Effient, he would pay $530.00 a month. The patient doesn't qualify for patient assistance since he is with the [place]. The [facility] will need a letter stating it is medically necessary for the patient to be on Effient and also tell why he is to be on the Effient. If the Effient is changed to another medication, the patient will need a written Rx to take to the [place] with written at the top of Rx "[place]." Please advise your recommendations.

## 2016-02-04 MED ORDER — PRASUGREL HCL 10 MG PO TABS: 10.0000 mg | ORAL_TABLET | Freq: Every day | ORAL | Status: DC

## 2016-02-04 NOTE — Telephone Encounter (Signed)
Notified Vickii Chafe (wife) a letter of medical neccesasity will be mailed to the patient along with a written Rx for Effient to take with him to the [place].

## 2016-02-05 ENCOUNTER — Encounter (HOSPITAL_COMMUNITY): Payer: No Typology Code available for payment source

## 2016-02-08 ENCOUNTER — Encounter (HOSPITAL_COMMUNITY)
Admission: RE | Admit: 2016-02-08 | Discharge: 2016-02-08 | Disposition: A | Discharge status: Home / Self Care | Payer: No Typology Code available for payment source | Source: Ambulatory Visit | Attending: Family Medicine | Admitting: Family Medicine

## 2016-02-08 DIAGNOSIS — I213 ST elevation (STEMI) myocardial infarction of unspecified site: Secondary | ICD-10-CM | POA: Diagnosis not present

## 2016-02-10 ENCOUNTER — Encounter (HOSPITAL_COMMUNITY)
Admission: RE | Admit: 2016-02-10 | Discharge: 2016-02-10 | Disposition: A | Discharge status: Home / Self Care | Payer: No Typology Code available for payment source | Source: Ambulatory Visit | Attending: Family Medicine | Admitting: Family Medicine

## 2016-02-10 ENCOUNTER — Telehealth: Payer: Self-pay | Admitting: Cardiovascular Disease

## 2016-02-10 DIAGNOSIS — I213 ST elevation (STEMI) myocardial infarction of unspecified site: Secondary | ICD-10-CM | POA: Diagnosis not present

## 2016-02-10 NOTE — Telephone Encounter (Signed)
Called pt wife, Vickii Chafe, on cell phone.  States she will call if the letter and prescription arrives today.  Peggy states pt has enough Effient samples to last until his May appt w/Dr. Fletcher Anon. States he has not missed any doses.  She would like for pt to continue Effient instead of switching to Plavix as Dr. Fletcher Anon suggested d/t cost because she states "he has had such a hard time adjusting to medications, I don't want him to have to change" I will make MD aware.

## 2016-02-10 NOTE — Telephone Encounter (Signed)
Pt wife called, states DR. Fletcher Anon was going to write a letter to the [place] and also a rx for Effient. States they have not received this in the mail yet. Please call.

## 2016-02-10 NOTE — Telephone Encounter (Signed)
Letter and Effient prescription mailed to pt 02/04/16. I have reprinted information, will have Dr. Fletcher Anon sign tomorrow and place another copy in the mail. Left pt/pt wife detailed message w/CB number if further questions.

## 2016-02-11 ENCOUNTER — Telehealth: Payer: Self-pay | Admitting: Cardiovascular Disease

## 2016-02-11 ENCOUNTER — Other Ambulatory Visit: Payer: Self-pay

## 2016-02-11 MED ORDER — PRASUGREL HCL 10 MG PO TABS: 10.0000 mg | ORAL_TABLET | Freq: Every day | ORAL | Status: DC

## 2016-02-11 NOTE — Telephone Encounter (Signed)
Mailed letter and Effient prescription to pt home address.

## 2016-02-12 ENCOUNTER — Encounter (HOSPITAL_COMMUNITY)
Admission: RE | Admit: 2016-02-12 | Discharge: 2016-02-12 | Disposition: A | Discharge status: Home / Self Care | Payer: No Typology Code available for payment source | Source: Ambulatory Visit | Attending: Family Medicine | Admitting: Family Medicine

## 2016-02-12 DIAGNOSIS — I213 ST elevation (STEMI) myocardial infarction of unspecified site: Secondary | ICD-10-CM | POA: Diagnosis not present

## 2016-02-15 ENCOUNTER — Telehealth: Payer: Self-pay | Admitting: Cardiovascular Disease

## 2016-02-15 ENCOUNTER — Encounter (HOSPITAL_COMMUNITY)
Admission: RE | Admit: 2016-02-15 | Discharge: 2016-02-15 | Disposition: A | Discharge status: Home / Self Care | Payer: No Typology Code available for payment source | Source: Ambulatory Visit | Attending: Family Medicine | Admitting: Family Medicine

## 2016-02-15 DIAGNOSIS — I213 ST elevation (STEMI) myocardial infarction of unspecified site: Secondary | ICD-10-CM | POA: Diagnosis not present

## 2016-02-15 NOTE — Telephone Encounter (Signed)
Letter reprinted and placed at the front desk for pt to p/u at his convenience.

## 2016-02-15 NOTE — Telephone Encounter (Signed)
Pt wife is calling regarding medical necessity note for [facility] stating he needs to be on Effient. Previous telephone note states letter was mailed on 5/11. Pt has not received it yet. Pt would like to come by and pick up a copy.

## 2016-02-17 ENCOUNTER — Encounter (HOSPITAL_COMMUNITY)
Admission: RE | Admit: 2016-02-17 | Discharge: 2016-02-17 | Disposition: A | Discharge status: Home / Self Care | Payer: No Typology Code available for payment source | Source: Ambulatory Visit | Attending: Family Medicine | Admitting: Family Medicine

## 2016-02-17 DIAGNOSIS — I213 ST elevation (STEMI) myocardial infarction of unspecified site: Secondary | ICD-10-CM | POA: Diagnosis not present

## 2016-02-18 ENCOUNTER — Telehealth: Payer: Self-pay

## 2016-02-18 NOTE — Telephone Encounter (Signed)
Pt is calling for results from his TCS/EGD. Please advise

## 2016-02-19 ENCOUNTER — Encounter (HOSPITAL_COMMUNITY)
Admission: RE | Admit: 2016-02-19 | Discharge: 2016-02-19 | Disposition: A | Discharge status: Home / Self Care | Payer: No Typology Code available for payment source | Source: Ambulatory Visit | Attending: Family Medicine | Admitting: Family Medicine

## 2016-02-19 DIAGNOSIS — I213 ST elevation (STEMI) myocardial infarction of unspecified site: Secondary | ICD-10-CM | POA: Diagnosis not present

## 2016-02-21 NOTE — Telephone Encounter (Signed)
Please call pt. Grant Cardenas advanced adenomas removed. HAD IT NOT BEEN REMOVED Grant WOULD'VE DEVELOPED COLON CANCER. Grant had Cardenas simple adenoma removed AND FOUR HYPERPLASTIC POLYPS REMOVED.   Grant needs a TCS in 3 years. Grant SHOULD FOLLOW A HIGH FIBER DIET. YOUR SISTERS, BROTHERS, CHILDREN, AND PARENTS NEED TO HAVE A COLONOSCOPY STARTING AT THE AGE OF 40.

## 2016-02-22 ENCOUNTER — Encounter (HOSPITAL_COMMUNITY)
Admission: RE | Admit: 2016-02-22 | Discharge: 2016-02-22 | Disposition: A | Discharge status: Home / Self Care | Payer: No Typology Code available for payment source | Source: Ambulatory Visit | Attending: Family Medicine | Admitting: Family Medicine

## 2016-02-22 DIAGNOSIS — I213 ST elevation (STEMI) myocardial infarction of unspecified site: Secondary | ICD-10-CM | POA: Diagnosis not present

## 2016-02-22 NOTE — Telephone Encounter (Signed)
Pt's wife called and is aware of his results. She requested a copy of Dr. Camelia Eng note and the path and I am mailing it to her.

## 2016-02-23 NOTE — Telephone Encounter (Signed)
Reminder in epic °

## 2016-02-24 ENCOUNTER — Encounter (HOSPITAL_COMMUNITY)
Admission: RE | Admit: 2016-02-24 | Discharge: 2016-02-24 | Disposition: A | Discharge status: Home / Self Care | Payer: No Typology Code available for payment source | Source: Ambulatory Visit | Attending: Family Medicine | Admitting: Family Medicine

## 2016-02-24 DIAGNOSIS — I213 ST elevation (STEMI) myocardial infarction of unspecified site: Secondary | ICD-10-CM | POA: Diagnosis not present

## 2016-02-24 NOTE — Progress Notes (Signed)
Daily Session Note  Patient Details  Name: Grant Cardenas MRN: [id number] Date of Birth: [birth year] Referring Provider:    Encounter Date: 02/24/2016  Check In:     Session Check In - 02/24/16 0930    Check-In   Location [facility]   Staff Present Diane Angelina Pih, MS, EP, [facility], Exercise Physiologist;Christy Oletta Lamas, RN, BSN;Other  Nils Flack, EP   Supervising physician immediately available to respond to emergencies See telemetry face sheet for immediately available MD   Medication changes reported     No   Fall or balance concerns reported    No   Warm-up and Cool-down Performed as group-led instruction   Resistance Training Performed Yes   VAD Patient? No   Pain Assessment   Currently in Pain? No/denies   Pain Score 0-No pain   Multiple Pain Sites No      Capillary Blood Glucose: No results found for this or any previous visit (from the past 24 hour(s)).   Goals Met:  Independence with exercise equipment Improved SOB with ADL's Exercise tolerated well No report of cardiac concerns or symptoms Strength training completed today  Goals Unmet:  RPE BP HR  Comments: Patient is progressing appropriately. Check out: 10:30am   Dr. Kate Sable is Medical Director for [place] and [facility].

## 2016-02-25 ENCOUNTER — Ambulatory Visit (INDEPENDENT_AMBULATORY_CARE_PROVIDER_SITE_OTHER): Payer: No Typology Code available for payment source | Admitting: Cardiovascular Disease

## 2016-02-25 ENCOUNTER — Encounter: Payer: Self-pay | Admitting: Cardiovascular Disease

## 2016-02-25 VITALS — BP 109/75 | HR 55 | Ht 73.0 in | Wt 187.5 lb

## 2016-02-25 DIAGNOSIS — K219 Gastro-esophageal reflux disease without esophagitis: Secondary | ICD-10-CM

## 2016-02-25 DIAGNOSIS — I25118 Atherosclerotic heart disease of native coronary artery with other forms of angina pectoris: Secondary | ICD-10-CM | POA: Diagnosis not present

## 2016-02-25 DIAGNOSIS — I4902 Ventricular flutter: Secondary | ICD-10-CM

## 2016-02-25 NOTE — Patient Instructions (Signed)
Medication Instructions: Continue same medications.   Labwork: None.   Procedures/Testing: None.   Follow-Up: 6 months with Dr. Fletcher Anon.   Any Additional Special Instructions Will Be Listed Below (If Applicable).  See Dr. Luan Pulling regarding evaluation for possible COPD.    If you need a refill on your cardiac medications before your next appointment, please call your pharmacy.

## 2016-02-25 NOTE — Progress Notes (Signed)
Cardiology Office Note   Date:  02/25/2016   ID:  Grant Cardenas, DOB 21-Mar-1956, MRN [id number]  PCP:  Alonza Bogus, MD  Cardiologist:   Kathlyn Sacramento, MD   Chief Complaint  Patient presents with  . other    3 month f/u c/o when he takes his medication he feels heart flutter and sob. Meds reviewed verbally.      History of Present Illness: Grant Cardenas is a 60 y.o. male who presents for  a follow-up visit regarding coronary artery disease and ischemic cardiomyopathy.  He presented in December 2016 with anterior ST elevation myocardial infarction. Cardiac catheterization showed occluded proximal LAD and 60% ostial RCA stenosis. There was also very distal disease affecting the LAD and ramus. He underwent successful angioplasty and drug-eluting stent placement to the LAD. Ejection fraction was 35-40% which improved subsequently to low- normal. He had suffered from recurrent chest pain and anxiety since then. He was hospitalized in January for chest pain and underwent cardiac catheterization which showed patent proximal LAD stent with unchanged disease. Ejection fraction was 50%.  Heartburn and belching improved with Protonix. He is attending cardiac rehabilitation but continues to be limited by significant dyspnea. He continues to have stress as well and his wife reports some gaps in his memory.  His other medical problems include psoriasis on methotrexate and hyperlipidemia.  Past Medical History  Diagnosis Date  . CAD (coronary artery disease)     a. anterior ST elevation MI in 09/2015. Cardiac cath showed occluded prox LAD and 60% ostial RCA stenosis. There was also very distal disease affecting the LAD and ramus. He underwent successful angioplasty and drug-eluting stent placement to the LAD. Ejection fraction was 35-40% which improved subsequently an echo to 55-60%.  . Hyperlipidemia   . Tobacco use   . Ischemic cardiomyopathy     a. EF by cath 09/13/2015: 35-40%;  b. 09/14/2015: EF 55-60%, Probable akinesis of  the midanteroseptal myocardium. akinesis of the apicalanterior myocardium, GR1DD  . MI (myocardial infarction) ([place])   . Psoriasis   . Vitreous floaters of both eyes     Past Surgical History  Procedure Laterality Date  . Cardiac catheterization N/A 09/13/2015    Procedure: Left Heart Cath and Coronary Angiography;  Surgeon: Wellington Hampshire, MD;  Location: [place] [facility];  Service: Cardiovascular;  Laterality: N/A;  . Cardiac catheterization N/A 09/13/2015    Procedure: Coronary Stent Intervention;  Surgeon: Wellington Hampshire, MD;  Location: [place] [facility];  Service: Cardiovascular;  Laterality: N/A;  . Cardiac catheterization N/A 11/03/2015    Procedure: Left Heart Cath and Coronary Angiography;  Surgeon: Peter M Martinique, MD;  Location: [place] [facility];  Service: Cardiovascular;  Laterality: N/A;  . Colonoscopy N/A 01/29/2016    Procedure: COLONOSCOPY;  Surgeon: Danie Binder, MD;  Location: [facility];  Service: Endoscopy;  Laterality: N/A;  1030-moved to 1215 office to notify   . Esophagogastroduodenoscopy N/A 01/29/2016    Procedure: ESOPHAGOGASTRODUODENOSCOPY (EGD);  Surgeon: Danie Binder, MD;  Location: [facility];  Service: Endoscopy;  Laterality: N/A;     Current Outpatient Prescriptions  Medication Sig Dispense Refill  . aspirin EC 81 MG tablet Take 1 tablet (81 mg total) by mouth daily. 90 tablet 3  . atorvastatin (LIPITOR) 40 MG tablet Take 1 tablet (40 mg total) by mouth daily at 6 PM. 30 tablet 6  . carvedilol (COREG) 6.25 MG tablet Take 1 tablet (6.25 mg  total) by mouth 2 (two) times daily with a meal. 30 tablet 1  . clobetasol ointment (TEMOVATE) [id number] % Apply 1 application topically 2 (two) times daily.   1  . folic acid (FOLVITE) 1 MG tablet Take 1 mg by mouth See admin instructions. Takes 1 tab daily except Fridays  3  . methotrexate (RHEUMATREX) 2.5 MG tablet Take 15 mg by mouth every Friday.     .  nitroGLYCERIN (NITROSTAT) 0.4 MG SL tablet Place 1 tablet (0.4 mg total) under the tongue every 5 (five) minutes as needed for chest pain. 25 tablet 12  . pantoprazole (PROTONIX) 40 MG tablet Take 40 mg by mouth daily with breakfast.  5  . prasugrel (EFFIENT) 10 MG TABS tablet Take 1 tablet (10 mg total) by mouth daily. 30 tablet 6   No current facility-administered medications for this visit.    Allergies:   Review of patient's allergies indicates no known allergies.    Social History:  The patient  reports that he quit smoking about 5 months ago. His smoking use included Cigarettes. He quit after 40 years of use. He has never used smokeless tobacco. He reports that he does not drink alcohol or use illicit drugs.   Family History:  The patient's family history includes Cancer in his other; Colon cancer in his maternal aunt; Heart Problems in his mother; Heart attack in his father; Heart disease in his father.      PHYSICAL EXAM: VS:  BP 109/75 mmHg  Pulse 55  Ht 6\' 1"  (1.854 m)  Wt 187 lb 8 oz (85.049 kg)  BMI 24.74 kg/m2 , BMI Body mass index is 24.74 kg/(m^2). GEN: Well nourished, well developed, in no acute distress HEENT: normal Neck: no JVD, carotid bruits, or masses Cardiac: RRR; no murmurs, rubs, or gallops,no edema  Respiratory:  clear to auscultation bilaterally, normal work of breathing GI: soft, nontender, nondistended, + BS MS: no deformity or atrophy Skin: warm and dry, no rash Neuro:  Strength and sensation are intact Psych: euthymic mood, full affect   EKG:  EKG  ordered today. EKG showed sinus bradycardia with evidence of prior septal infarct with anterolateral T wave changes.   Recent Labs: 09/14/2015: B Natriuretic Peptide 77.1 11/02/2015: BUN 15; Creatinine, Ser 1.13; Hemoglobin 15.1; Platelets 218; Potassium 4.5; Sodium 139 11/26/2015: ALT 90*    Lipid Panel    Component Value Date/Time   CHOL 153 11/26/2015 0815   CHOL 212* 09/23/2015 1540   TRIG  85 11/26/2015 0815   HDL 41 11/26/2015 0815   HDL 41 09/23/2015 1540   CHOLHDL 3.7 11/26/2015 0815   CHOLHDL 5.2* 09/23/2015 1540   VLDL 17 11/26/2015 0815   LDLCALC 95 11/26/2015 0815   LDLCALC 137* 09/23/2015 1540      Wt Readings from Last 3 Encounters:  02/25/16 187 lb 8 oz (85.049 kg)  01/29/16 185 lb (83.915 kg)  12/22/15 186 lb 8 oz (84.596 kg)        ASSESSMENT AND PLAN:  1.  Coronary artery disease involving native coronary arteries of native heart with other forms of angina : Continue dual antiplatelet therapy and aggressive treatment of risk factors.Continue cardiac rehabilitation. The patient is not close to his baseline before his myocardial infarction and continues to be significantly limited with exertional dyspnea and fatigue which is unusual for him.  2.  GERD: Improved with Protonix 40 mg once daily. The patient has been seen by GI.  3.  Ischemic cardiomyopathy: Most recent  ejection fraction was 45-55%. Continue treatment with carvedilol. He is not on ACE inhibitor due to hypotension   4. Hyperlipidemia : LDL improved to 95. Mildly abnormal liver profile and thus I did not increase the dose. His LDL before treatment was greater than 200.  5. Exertional dyspnea: He has prolonged history of tobacco use and given his continued symptoms, I recommend pulmonary evaluation and pulmonary function testing. He will try to follow-up with Dr. Luan Pulling regarding this.    Disposition:   FU with me in 6 months  Signed,  Kathlyn Sacramento, MD  02/25/2016 9:57 AM    [place]

## 2016-02-26 ENCOUNTER — Encounter (HOSPITAL_COMMUNITY)
Admission: RE | Admit: 2016-02-26 | Discharge: 2016-02-26 | Disposition: A | Discharge status: Home / Self Care | Payer: No Typology Code available for payment source | Source: Ambulatory Visit | Attending: Family Medicine | Admitting: Family Medicine

## 2016-02-26 DIAGNOSIS — Z955 Presence of coronary angioplasty implant and graft: Secondary | ICD-10-CM

## 2016-02-26 DIAGNOSIS — I2102 ST elevation (STEMI) myocardial infarction involving left anterior descending coronary artery: Secondary | ICD-10-CM

## 2016-02-26 DIAGNOSIS — I213 ST elevation (STEMI) myocardial infarction of unspecified site: Secondary | ICD-10-CM | POA: Diagnosis not present

## 2016-02-26 NOTE — Progress Notes (Signed)
Daily Session Note  Patient Details  Name: Grant Cardenas MRN: [id number] Date of Birth: 04/23/1956 Referring Provider:    Encounter Date: 02/26/2016  Check In:     Session Check In - 02/26/16 0930    Check-In   Location [facility]   Staff Present Diane Coad, MS, EP, [facility], Exercise Physiologist;Christy Edwards, RN, BSN;Other  Greg Cowan, EP   Supervising physician immediately available to respond to emergencies See telemetry face sheet for immediately available MD   Medication changes reported     No   Fall or balance concerns reported    No   Warm-up and Cool-down Performed as group-led instruction   Resistance Training Performed Yes   VAD Patient? No   Pain Assessment   Currently in Pain? No/denies   Multiple Pain Sites No      Capillary Blood Glucose: No results found for this or any previous visit (from the past 24 hour(s)).   Goals Met:  Independence with exercise equipment Exercise tolerated well No report of cardiac concerns or symptoms Strength training completed today  Goals Unmet:  RPE BP HR  Comments: Patient is progressing appropriately. Check out: 1030   Dr. Suresh Koneswaran is Medical Director for [place]. 

## 2016-02-28 NOTE — Progress Notes (Signed)
Cardiac Individual Treatment Plan  Patient Details  Name: Grant Cardenas MRN: [id number] Date of Birth: [birth year] Referring Provider:    Initial Encounter Date:       CARDIAC REHAB PHASE II ORIENTATION from 62/21/2017 in [place]   Date  12/22/15      Visit Diagnosis: ST elevation myocardial infarction involving left anterior descending coronary artery (HCC)  Stented coronary artery  Patient's Home Medications on Admission:  Current outpatient prescriptions:  .  aspirin EC 81 MG tablet, Take 1 tablet (81 mg total) by mouth daily., Disp: 90 tablet, Rfl: 3 .  atorvastatin (LIPITOR) 40 MG tablet, Take 1 tablet (40 mg total) by mouth daily at 6 PM., Disp: 30 tablet, Rfl: 6 .  carvedilol (COREG) 6.25 MG tablet, Take 1 tablet (6.25 mg total) by mouth 2 (two) times daily with a meal., Disp: 30 tablet, Rfl: 1 .  clobetasol ointment (TEMOVATE) [id number] %, Apply 1 application topically 2 (two) times daily. , Disp: , Rfl: 1 .  folic acid (FOLVITE) 1 MG tablet, Take 1 mg by mouth See admin instructions. Takes 1 tab daily except Fridays, Disp: , Rfl: 3 .  methotrexate (RHEUMATREX) 2.5 MG tablet, Take 15 mg by mouth every Friday. , Disp: , Rfl:  .  nitroGLYCERIN (NITROSTAT) 0.4 MG SL tablet, Place 1 tablet (0.4 mg total) under the tongue every 5 (five) minutes as needed for chest pain., Disp: 25 tablet, Rfl: 12 .  pantoprazole (PROTONIX) 40 MG tablet, Take 40 mg by mouth daily with breakfast., Disp: , Rfl: 5 .  prasugrel (EFFIENT) 10 MG TABS tablet, Take 1 tablet (10 mg total) by mouth daily., Disp: 30 tablet, Rfl: 6  Past Medical History: Past Medical History  Diagnosis Date  . CAD (coronary artery disease)     a. anterior ST elevation MI in 09/2015. Cardiac cath showed occluded prox LAD and 60% ostial RCA stenosis. There was also very distal disease affecting the LAD and ramus. He underwent successful angioplasty and drug-eluting stent placement to the LAD. Ejection  fraction was 35-40% which improved subsequently an echo to 55-60%.  . Hyperlipidemia   . Tobacco use   . Ischemic cardiomyopathy     a. EF by cath 09/13/2015: 35-40%; b. 09/14/2015: EF 55-60%, Probable akinesis of  the midanteroseptal myocardium. akinesis of the apicalanterior myocardium, GR1DD  . MI (myocardial infarction) ([place])   . Psoriasis   . Vitreous floaters of both eyes     Tobacco Use: History  Smoking status  . Former Smoker -- 14 years  . Types: Cigarettes  . Quit date: 09/14/2015  Smokeless tobacco  . Never Used    Labs:     Recent Review Flowsheet Data    Labs for ITP Cardiac and Pulmonary Rehab Latest Ref Rng 09/13/2015 09/14/2015 09/23/2015 11/26/2015   Cholestrol 0 - 200 mg/dL - 271(H) 212(H) 153   LDLCALC 0 - 99 mg/dL - 208(H) 137(H) 95   HDL >40 mg/dL - 40(L) 41 41   Trlycerides <150 mg/dL - 116 171(H) 85   TCO2 0 - 100 mmol/L 26 - - -      Capillary Blood Glucose: No results found for: GLUCAP   Exercise Target Goals:    Exercise Program Goal: Individual exercise prescription set with THRR, safety & activity barriers. Participant demonstrates ability to understand and report RPE using BORG scale, to self-measure pulse accurately, and to acknowledge the importance of the exercise prescription.  Exercise Prescription Goal: Starting with aerobic activity  30 plus minutes a day, 3 days per week for initial exercise prescription. Provide home exercise prescription and guidelines that participant acknowledges understanding prior to discharge.  Activity Barriers & Risk Stratification:     Activity Barriers & Cardiac Risk Stratification - 12/22/15 1558    Activity Barriers & Cardiac Risk Stratification   Activity Barriers None   Cardiac Risk Stratification High      6 Minute Walk:     6 Minute Walk      12/22/15 1506       6 Minute Walk   Phase Initial     Distance 1350 feet     Walk Time 6 minutes     # of Rest Breaks 0     MPH 2.56      METS 2.96     RPE 11     Perceived Dyspnea  13     VO2 Peak 12.54     Symptoms No     Resting HR 73 bpm     Resting BP 108/60 mmHg     Max Ex. HR 88 bpm     Max Ex. BP 120/72 mmHg     2 Minute Post BP 106/68 mmHg     Pre/Post BP   Baseline BP 108/60 mmHg     6 Minute BP 120/72 mmHg     Pre/Post BP? Yes        Initial Exercise Prescription:     Initial Exercise Prescription - 12/22/15 1500    Date of Initial Exercise RX and Referring Provider   Date 12/22/15   Treadmill   MPH 1.8   Grade 0   Minutes 15   METs 2.38   NuStep   Level 2   Minutes 15   METs 1.5   Prescription Details   Frequency (times per week) 3   Intensity   THRR REST +  30   THRR 40-80% of Max Heartrate 107-141   Ratings of Perceived Exertion 11-13   Progression   Progression Continue to progress workloads to maintain intensity without signs/symptoms of physical distress.   Resistance Training   Training Prescription Yes   Weight 1.0   Reps 10-12      Perform Capillary Blood Glucose checks as needed.  Exercise Prescription Changes:      Exercise Prescription Changes      12/28/15 1300 01/29/16 1400 02/28/16 1100       Exercise Review   Progression Yes Yes Yes     Response to Exercise   Blood Pressure (Admit) 96/64 mmHg 100/50 mmHg 104/58 mmHg     Blood Pressure (Exercise) 140/66 mmHg 92/60 mmHg 118/52 mmHg     Blood Pressure (Exit) 100/60 mmHg 100/68 mmHg 104/58 mmHg     Heart Rate (Admit) 67 bpm 73 bpm 65 bpm     Heart Rate (Exercise) 81 bpm 87 bpm 84 bpm     Heart Rate (Exit) 64 bpm 79 bpm 73 bpm     Rating of Perceived Exertion (Exercise) 11 11 11      Symptoms No No No     Progression   Progression Continue to progress workloads to maintain intensity without signs/symptoms of physical distress. Continue to progress workloads to maintain intensity without signs/symptoms of physical distress. Continue to progress workloads to maintain intensity without signs/symptoms of physical  distress.     Resistance Training   Training Prescription Yes Yes Yes     Weight 1.0 3.0 5.0     Reps 10-12  10-12 10-12     Interval Training   Interval Training No No No     Treadmill   MPH 1.5 2.5 2.6     Grade 0 0 0.5     Minutes 15 15 15      METs 2.1 2.91 3.2     NuStep   Level 2 2 3      Minutes 15 15 15      METs 3.58 1.9 3.58     Home Exercise Plan   Plans to continue exercise at  [place] 2 additional days to program exercise sessions. Add 2 additional days to program exercise sessions.        Exercise Comments:      Exercise Comments      01/29/16 1435 02/28/16 1119         Exercise Comments Patient is progressing appropriately. Patient is progressing appropriately.           Discharge Exercise Prescription (Final Exercise Prescription Changes):     Exercise Prescription Changes - 02/28/16 1100    Exercise Review   Progression Yes   Response to Exercise   Blood Pressure (Admit) 104/58 mmHg   Blood Pressure (Exercise) 118/52 mmHg   Blood Pressure (Exit) 104/58 mmHg   Heart Rate (Admit) 65 bpm   Heart Rate (Exercise) 84 bpm   Heart Rate (Exit) 73 bpm   Rating of Perceived Exertion (Exercise) 11   Symptoms No   Progression   Progression Continue to progress workloads to maintain intensity without signs/symptoms of physical distress.   Resistance Training   Training Prescription Yes   Weight 5.0   Reps 10-12   Interval Training   Interval Training No   Treadmill   MPH 2.6   Grade 0.5   Minutes 15   METs 3.2   NuStep   Level 3   Minutes 15   METs 3.58   Home Exercise Plan   Plans to continue exercise at Home   Frequency Add 2 additional days to program exercise sessions.      Nutrition:  Target Goals: Understanding of nutrition guidelines, daily intake of sodium 1500mg , cholesterol 200mg , calories 30% from fat and 7% or less from saturated fats, daily to have 5 or more servings of fruits and  vegetables.  Biometrics:     Pre Biometrics - 12/22/15 1537    Pre Biometrics   Height 6\' 1"  (1.854 m)   Weight 186 lb 8 oz (84.596 kg)   Waist Circumference 35 inches   Hip Circumference 38 inches   Waist to Hip Ratio 0.92 %   BMI (Calculated) 24.7   Triceps Skinfold 9 mm   % Body Fat 21.3 %   Grip Strength 100 kg   Flexibility 12 in   Single Leg Stand 60 seconds       Nutrition Therapy Plan and Nutrition Goals:     Nutrition Therapy & Goals - 12/22/15 1601    Intervention Plan   Intervention Nutrition handout(s) given to patient.   Expected Outcomes Short Term Goal: Understand basic principles of dietary content, such as calories, fat, sodium, cholesterol and nutrients.      Nutrition Discharge: Rate Your Plate Scores:     Nutrition Assessments - 12/22/15 1601    MEDFICTS Scores   Pre Score 6      Nutrition Goals Re-Evaluation:     Nutrition Goals Re-Evaluation      01/29/16 [phone]  Intervention Plan   Intervention Continue to educate, counsel and set short/long term goals regarding individualized specific personal dietary modifications.          Psychosocial: Target Goals: Acknowledge presence or absence of depression, maximize coping skills, provide positive support system. Participant is able to verbalize types and ability to use techniques and skills needed for reducing stress and depression.  Initial Review & Psychosocial Screening:     Initial Psych Review & Screening - 12/22/15 1602    Initial Review   Current issues with Current Stress Concerns   Source of Stress Concerns --  not being able to eat what he wants and wants a cigarette   [place]? Yes   Barriers   Psychosocial barriers to participate in program There are no identifiable barriers or psychosocial needs.   Screening Interventions   Interventions Encouraged to exercise      Quality of Life Scores:     Quality of Life - 12/22/15 1539     Quality of Life Scores   Health/Function Pre 10.03 %   Socioeconomic Pre 19.86 %   Psych/Spiritual Pre 14.93 %   Family Pre 12.88 %   GLOBAL Pre 13.5 %      PHQ-9:     Recent Review Flowsheet Data    Depression screen [facility] 2/9 12/22/2015   Decreased Interest 0   Down, Depressed, Hopeless 0   PHQ - 2 Score 0   Altered sleeping 0   Tired, decreased energy 0   Change in appetite 0   Feeling bad or failure about yourself  0   Trouble concentrating 0   Moving slowly or fidgety/restless 0   Suicidal thoughts 0   PHQ-9 Score 0   Difficult doing work/chores Not difficult at all      Psychosocial Evaluation and Intervention:     Psychosocial Evaluation - 12/22/15 1603    Psychosocial Evaluation & Interventions   Interventions Encouraged to exercise with the program and follow exercise prescription   Continued Psychosocial Services Needed No      Psychosocial Re-Evaluation:     Psychosocial Re-Evaluation      01/29/16 0842 02/22/16 1502         Psychosocial Re-Evaluation   Interventions Encouraged to attend Cardiac Rehabilitation for the exercise Encouraged to attend Cardiac Rehabilitation for the exercise      Continued Psychosocial Services Needed No No         Vocational Rehabilitation: Provide vocational rehab assistance to qualifying candidates.   Vocational Rehab Evaluation & Intervention:     Vocational Rehab - 12/22/15 1559    Initial Vocational Rehab Evaluation & Intervention   Assessment shows need for Vocational Rehabilitation No      Education: Education Goals: Education classes will be provided on a weekly basis, covering required topics. Participant will state understanding/return demonstration of topics presented.  Learning Barriers/Preferences:     Learning Barriers/Preferences - 12/22/15 1559    Learning Barriers/Preferences   Learning Preferences Skilled Demonstration      Education Topics: Hypertension, Hypertension  Reduction -Define heart disease and high blood pressure. Discus how high blood pressure affects the body and ways to reduce high blood pressure.      CARDIAC REHAB PHASE II EXERCISE from 02/24/2016 in [facility]   Date  02/03/16   Educator  Russella Dar   Instruction Review Code  2- meets goals/outcomes      Exercise and Your Heart -Discuss why it  is important to exercise, the FITT principles of exercise, normal and abnormal responses to exercise, and how to exercise safely.      CARDIAC REHAB PHASE II EXERCISE from 02/24/2016 in [facility]   Date  02/10/16   Educator  Russella Dar   Instruction Review Code  2- meets goals/outcomes      Angina -Discuss definition of angina, causes of angina, treatment of angina, and how to decrease risk of having angina.      CARDIAC REHAB PHASE II EXERCISE from 02/24/2016 in [facility]   Date  02/17/16   Educator  d Angelina Pih   Instruction Review Code  2- meets goals/outcomes      Cardiac Medications -Review what the following cardiac medications are used for, how they affect the body, and side effects that may occur when taking the medications.  Medications include Aspirin, Beta blockers, calcium channel blockers, ACE Inhibitors, angiotensin receptor blockers, diuretics, digoxin, and antihyperlipidemics.      CARDIAC REHAB PHASE II EXERCISE from 02/24/2016 in [place] [facility]   Date  02/24/16   Educator  D. Coad   Instruction Review Code  2- meets goals/outcomes      Congestive Heart Failure -Discuss the definition of CHF, how to live with CHF, the signs and symptoms of CHF, and how keep track of weight and sodium intake.   Heart Disease and Intimacy -Discus the effect sexual activity has on the heart, how changes occur during intimacy as we age, and safety during sexual activity.   Smoking Cessation / COPD -Discuss different methods to quit smoking, the health  benefits of quitting smoking, and the definition of COPD.   Nutrition I: Fats -Discuss the types of cholesterol, what cholesterol does to the heart, and how cholesterol levels can be controlled.   Nutrition II: Labels -Discuss the different components of food labels and how to read food label      CARDIAC REHAB PHASE II EXERCISE from 02/24/2016 in [facility]   Date  12/30/15   Educator  Russella Dar   Instruction Review Code  2- meets goals/outcomes      Heart Parts and Heart Disease -Discuss the anatomy of the heart, the pathway of blood circulation through the heart, and these are affected by heart disease.      CARDIAC REHAB PHASE II EXERCISE from 02/24/2016 in [facility]   Date  01/06/16   Educator  -- Shauna Hugh Coad]   Instruction Review Code  2- meets goals/outcomes      Stress I: Signs and Symptoms -Discuss the causes of stress, how stress may lead to anxiety and depression, and ways to limit stress.      CARDIAC REHAB PHASE II EXERCISE from 02/24/2016 in [facility]   Date  01/13/16   Educator  D Coad   Instruction Review Code  2- meets goals/outcomes      Stress II: Relaxation -Discuss different types of relaxation techniques to limit stress.      CARDIAC REHAB PHASE II EXERCISE from 02/24/2016 in [place]   Date  01/20/16   Educator  D Coad   Instruction Review Code  2- meets goals/outcomes      Warning Signs of Stroke / TIA -Discuss definition of a stroke, what the signs and symptoms are of a stroke, and how to identify when someone is having stroke.          CARDIAC REHAB  PHASE II EXERCISE from 02/24/2016 in [place]   Date  01/27/16   Educator  Russella Dar   Instruction Review Code  2- meets goals/outcomes      Knowledge Questionnaire Score:     Knowledge Questionnaire Score - 12/22/15 1559    Knowledge Questionnaire Score   Pre Score 17/24       Core Components/Risk Factors/Patient Goals at Admission:     Personal Goals and Risk Factors at Admission - 12/22/15 1601    Core Components/Risk Factors/Patient Goals on Admission   Improve shortness of breath with ADL's Yes   Intervention Provide education, individualized exercise plan and daily activity instruction to help decrease symptoms of SOB with activities of daily living.   Expected Outcomes Short Term: Achieves a reduction of symptoms when performing activities of daily living.   Develop more efficient breathing techniques such as purse lipped breathing and diaphragmatic breathing; and practicing self-pacing with activity Yes   Intervention Provide education, demonstration and support about specific breathing techniuqes utilized for more efficient breathing. Include techniques such as pursed lipped breathing, diaphragmatic breathing and self-pacing activity.   Expected Outcomes Short Term: Participant will be able to demonstrate and use breathing techniques as needed throughout daily activities.   Hypertension Yes   Intervention Monitor prescription use compliance.;Provide education on lifestyle modifcations including regular physical activity/exercise, weight management, moderate sodium restriction and increased consumption of fresh fruit, vegetables, and low fat dairy, alcohol moderation, and smoking cessation.   Expected Outcomes Long Term: Maintenance of blood pressure at goal levels.   Lipids Yes   Intervention Provide education and support for participant on nutrition & aerobic/resistive exercise along with prescribed medications to achieve LDL 70mg , HDL >40mg .   Expected Outcomes Long Term: Cholesterol controlled with medications as prescribed, with individualized exercise RX and with personalized nutrition plan. Value goals: LDL < 70mg , HDL > 40 mg.      Core Components/Risk Factors/Patient Goals Review:      Goals and Risk Factor Review      01/29/16 0840  02/22/16 1502         Core Components/Risk Factors/Patient Goals Review   Personal Goals Review Improve shortness of breath with ADL's;Hypertension;Develop more efficient breathing techniques such as purse lipped breathing and diaphragmatic breathing and practicing self-pacing with activity.;Lipids Improve shortness of breath with ADL's;Hypertension;Develop more efficient breathing techniques such as purse lipped breathing and diaphragmatic breathing and practicing self-pacing with activity.;Lipids      Review BP has been controlled during program.  patient stated he is breathing a little better.  BP has been controlled during program.  patient stated he is breathing a little better. Patient can demonstrate pursed lipped breathing.       Expected Outcomes increase energy, play golf again.  increase energy, play golf again. BP WNL, breathe better.          Core Components/Risk Factors/Patient Goals at Discharge (Final Review):      Goals and Risk Factor Review - 02/22/16 1502    Core Components/Risk Factors/Patient Goals Review   Personal Goals Review Improve shortness of breath with ADL's;Hypertension;Develop more efficient breathing techniques such as purse lipped breathing and diaphragmatic breathing and practicing self-pacing with activity.;Lipids   Review BP has been controlled during program.  patient stated he is breathing a little better. Patient can demonstrate pursed lipped breathing.    Expected Outcomes increase energy, play golf again. BP WNL, breathe better.       ITP Comments:  ITP Comments      12/22/15 1559           ITP Comments Patient is a 60 year old man that lives with spouse.  Patient denies depression but stressed he cant eat what he wants and smoke a cigarette. Enjoys golf.           Comments: Patient is doing well in program. We will continue to monitor for progress.

## 2016-02-29 ENCOUNTER — Encounter (HOSPITAL_COMMUNITY): Payer: No Typology Code available for payment source

## 2016-03-02 ENCOUNTER — Encounter (HOSPITAL_COMMUNITY)
Admission: RE | Admit: 2016-03-02 | Discharge: 2016-03-02 | Disposition: A | Discharge status: Home / Self Care | Payer: No Typology Code available for payment source | Source: Ambulatory Visit | Attending: Family Medicine | Admitting: Family Medicine

## 2016-03-02 ENCOUNTER — Telehealth: Payer: Self-pay | Admitting: Cardiovascular Disease

## 2016-03-02 DIAGNOSIS — I213 ST elevation (STEMI) myocardial infarction of unspecified site: Secondary | ICD-10-CM | POA: Diagnosis not present

## 2016-03-02 NOTE — Progress Notes (Signed)
Daily Session Note  Patient Details  Name: Grant Cardenas MRN: [id number] Date of Birth: 02-18-1956 Referring Provider:    Encounter Date: 03/02/2016  Check In:     Session Check In - 03/02/16 1116    Check-In   Location [facility]   Staff Present Diane Angelina Pih, MS, EP, [facility], Exercise Physiologist;Christy Oletta Lamas, RN, BSN;Other   Supervising physician immediately available to respond to emergencies See telemetry face sheet for immediately available MD   Medication changes reported     No   Fall or balance concerns reported    No   Warm-up and Cool-down Performed as group-led instruction   Resistance Training Performed Yes   VAD Patient? No   Pain Assessment   Currently in Pain? No/denies      Capillary Blood Glucose: No results found for this or any previous visit (from the past 24 hour(s)).   Goals Met:  Independence with exercise equipment Exercise tolerated well No report of cardiac concerns or symptoms Strength training completed today  Goals Unmet:  Not Applicable  Comments: check out 1030   Dr. Kate Sable is Medical Director for [place] and Pulmonary Rehab.

## 2016-03-02 NOTE — Telephone Encounter (Signed)
Pt wife calling stating last time patient was here Dr Fletcher Anon suggested patient go see a Pulmonologist to test him for COPD They called the [facility] and were told we need to fax them a letter with pt SS number and DOB  Stating we are recommending him to do that.  They are asking if we can also write the doctor they would like to see who is Dr Sinda Du in [place]  Please fax this letter to the [facility] [phone]

## 2016-03-03 DIAGNOSIS — J449 Chronic obstructive pulmonary disease, unspecified: Secondary | ICD-10-CM

## 2016-03-03 HISTORY — DX: Chronic obstructive pulmonary disease, unspecified: J44.9

## 2016-03-03 NOTE — Telephone Encounter (Signed)
S/w wife, Vickii Chafe, who reports [facility]/insurance will need letter from Dr. Fletcher Anon for referral to pulmonology which was noted at 5/25 OV. Letter faxed to [facility], [phone]

## 2016-03-04 ENCOUNTER — Encounter (HOSPITAL_COMMUNITY)
Admission: RE | Admit: 2016-03-04 | Discharge: 2016-03-04 | Disposition: A | Discharge status: Home / Self Care | Payer: No Typology Code available for payment source | Source: Ambulatory Visit | Attending: Family Medicine | Admitting: Family Medicine

## 2016-03-04 DIAGNOSIS — I213 ST elevation (STEMI) myocardial infarction of unspecified site: Secondary | ICD-10-CM | POA: Diagnosis not present

## 2016-03-04 NOTE — Progress Notes (Signed)
Daily Session Note  Patient Details  Name: BROLIN DAMBROSIA MRN: [id number] Date of Birth: [birth year] Referring Provider:    Encounter Date: 03/04/2016  Check In:     Session Check In - 03/04/16 1123    Check-In   Location [facility]   Staff Present Diane Angelina Pih, MS, EP, [facility], Exercise Physiologist;Christy Oletta Lamas, RN, BSN;Other   Supervising physician immediately available to respond to emergencies See telemetry face sheet for immediately available MD   Medication changes reported     No   Fall or balance concerns reported    No   Warm-up and Cool-down Performed as group-led instruction   Resistance Training Performed Yes   VAD Patient? No   Pain Assessment   Currently in Pain? No/denies   Pain Score 0-No pain   Multiple Pain Sites No      Capillary Blood Glucose: No results found for this or any previous visit (from the past 24 hour(s)).   Goals Met:  Independence with exercise equipment Exercise tolerated well No report of cardiac concerns or symptoms Strength training completed today  Goals Unmet:  Not Applicable  Comments: Check out: 10:30   Dr. Kate Sable is Medical Director for [place] and Pulmonary Rehab.

## 2016-03-07 ENCOUNTER — Encounter (HOSPITAL_COMMUNITY)
Admission: RE | Admit: 2016-03-07 | Discharge: 2016-03-07 | Disposition: A | Discharge status: Home / Self Care | Payer: No Typology Code available for payment source | Source: Ambulatory Visit | Attending: Family Medicine | Admitting: Family Medicine

## 2016-03-07 DIAGNOSIS — I213 ST elevation (STEMI) myocardial infarction of unspecified site: Secondary | ICD-10-CM | POA: Diagnosis not present

## 2016-03-07 NOTE — Progress Notes (Signed)
Daily Session Note  Patient Details  Name: CAHLIL SATTAR MRN: [id number] Date of Birth: 1956-03-25 Referring Provider:    Encounter Date: 03/07/2016  Check In:     Session Check In - 03/07/16 0930    Check-In   Location [facility]   Staff Present Diane Angelina Pih, MS, EP, [facility], Exercise Physiologist;Christy Oletta Lamas, RN, BSN;Other  Nils Flack, EP   Supervising physician immediately available to respond to emergencies See telemetry face sheet for immediately available MD   Medication changes reported     No   Fall or balance concerns reported    No   Warm-up and Cool-down Performed as group-led instruction   Resistance Training Performed Yes   VAD Patient? No   Pain Assessment   Currently in Pain? No/denies   Pain Score 0-No pain   Multiple Pain Sites No      Capillary Blood Glucose: No results found for this or any previous visit (from the past 24 hour(s)).   Goals Met:  Independence with exercise equipment Exercise tolerated well No report of cardiac concerns or symptoms Strength training completed today  Goals Unmet:  Not Applicable  Comments: Check out 10:30   Dr. Kate Sable is Medical Director for [place] and Pulmonary Rehab.

## 2016-03-09 ENCOUNTER — Encounter (HOSPITAL_COMMUNITY)
Admission: RE | Admit: 2016-03-09 | Discharge: 2016-03-09 | Disposition: A | Discharge status: Home / Self Care | Payer: No Typology Code available for payment source | Source: Ambulatory Visit | Attending: Family Medicine | Admitting: Family Medicine

## 2016-03-09 DIAGNOSIS — I213 ST elevation (STEMI) myocardial infarction of unspecified site: Secondary | ICD-10-CM | POA: Diagnosis not present

## 2016-03-09 NOTE — Progress Notes (Signed)
Daily Session Note  Patient Details  Name: [name] MRN: [id number] Date of Birth: Jul 23, 1956 Referring Provider:    Encounter Date: 03/09/2016  Check In:     Session Check In - 03/09/16 0930    Check-In   Location [facility]   Staff Present Diane Angelina Pih, MS, EP, [facility], Exercise Physiologist;Christy Oletta Lamas, RN, BSN;Other  [place] physician immediately available to respond to emergencies See telemetry face sheet for immediately available MD   Medication changes reported     No   Fall or balance concerns reported    No   Warm-up and Cool-down Performed as group-led instruction   Resistance Training Performed Yes   VAD Patient? No   Pain Assessment   Currently in Pain? No/denies   Pain Score 0-No pain   Multiple Pain Sites No      Capillary Blood Glucose: No results found for this or any previous visit (from the past 24 hour(s)).   Goals Met:  Independence with exercise equipment Exercise tolerated well No report of cardiac concerns or symptoms Strength training completed today  Goals Unmet:  Not Applicable  Comments: Check out 1030   Dr. Kate Sable is Medical Director for [place] and Pulmonary Rehab.

## 2016-03-11 ENCOUNTER — Encounter (HOSPITAL_COMMUNITY)
Admission: RE | Admit: 2016-03-11 | Discharge: 2016-03-11 | Disposition: A | Discharge status: Home / Self Care | Payer: No Typology Code available for payment source | Source: Ambulatory Visit | Attending: Family Medicine | Admitting: Family Medicine

## 2016-03-11 DIAGNOSIS — I213 ST elevation (STEMI) myocardial infarction of unspecified site: Secondary | ICD-10-CM | POA: Diagnosis not present

## 2016-03-11 NOTE — Progress Notes (Signed)
Daily Session Note  Patient Details  Name: Grant Cardenas MRN: [id number] Date of Birth: 09/08/1956 Referring Provider:    Encounter Date: 03/11/2016  Check In:     Session Check In - 03/11/16 0945    Check-In   Location [facility]   Staff Present Diane Coad, MS, EP, [facility], Exercise Physiologist;Gregory Cowan, BS, EP, Exercise Physiologist;Christy Edwards, RN, BSN   Supervising physician immediately available to respond to emergencies See telemetry face sheet for immediately available MD   Medication changes reported     No   Fall or balance concerns reported    No   Warm-up and Cool-down Performed as group-led instruction   Resistance Training Performed Yes   VAD Patient? No   Pain Assessment   Currently in Pain? No/denies      Capillary Blood Glucose: No results found for this or any previous visit (from the past 24 hour(s)).   Goals Met:  Independence with exercise equipment Exercise tolerated well No report of cardiac concerns or symptoms Strength training completed today  Goals Unmet:  Not Applicable  Comments: check out 1030   Dr. Suresh Koneswaran is Medical Director for [place] [facility]. 

## 2016-03-14 ENCOUNTER — Encounter (HOSPITAL_COMMUNITY)
Admission: RE | Admit: 2016-03-14 | Discharge: 2016-03-14 | Disposition: A | Discharge status: Home / Self Care | Payer: No Typology Code available for payment source | Source: Ambulatory Visit | Attending: Family Medicine | Admitting: Family Medicine

## 2016-03-14 DIAGNOSIS — I213 ST elevation (STEMI) myocardial infarction of unspecified site: Secondary | ICD-10-CM | POA: Diagnosis not present

## 2016-03-14 NOTE — Progress Notes (Signed)
Daily Session Note  Patient Details  Name: Grant Cardenas MRN: [id number] Date of Birth: 08/06/1956 Referring Provider:    Encounter Date: 03/14/2016  Check In:     Session Check In - 03/14/16 0958    Check-In   Location [facility]   Staff Present Diane Coad, MS, EP, [facility], Exercise Physiologist;Gregory Cowan, BS, EP, Exercise Physiologist;Christy Edwards, RN, BSN   Supervising physician immediately available to respond to emergencies See telemetry face sheet for immediately available MD   Medication changes reported     No   Fall or balance concerns reported    No   Warm-up and Cool-down Performed as group-led instruction   Resistance Training Performed Yes   VAD Patient? No   Pain Assessment   Currently in Pain? No/denies   Pain Score 0-No pain   Multiple Pain Sites No      Capillary Blood Glucose: No results found for this or any previous visit (from the past 24 hour(s)).   Goals Met:  Independence with exercise equipment Exercise tolerated well No report of cardiac concerns or symptoms  Goals Unmet:  Not Applicable  Comments: Check out 1030   Dr. Suresh Koneswaran is Medical Director for [place] [facility]. 

## 2016-03-16 ENCOUNTER — Telehealth: Payer: Self-pay | Admitting: Cardiovascular Disease

## 2016-03-16 ENCOUNTER — Encounter (HOSPITAL_COMMUNITY)
Admission: RE | Admit: 2016-03-16 | Discharge: 2016-03-16 | Disposition: A | Discharge status: Home / Self Care | Payer: No Typology Code available for payment source | Source: Ambulatory Visit | Attending: Family Medicine | Admitting: Family Medicine

## 2016-03-16 DIAGNOSIS — I213 ST elevation (STEMI) myocardial infarction of unspecified site: Secondary | ICD-10-CM | POA: Diagnosis not present

## 2016-03-16 NOTE — Telephone Encounter (Signed)
-   Informed  patient regarding release of information packet .  - Mailed release packet to patient.  Received records request [employer], attorneys at law, forwarded to [facility] for processing.

## 2016-03-16 NOTE — Progress Notes (Signed)
Daily Session Note  Patient Details  Name: Grant Cardenas MRN: [id number] Date of Birth: 1955/12/28 Referring Provider:    Encounter Date: 03/16/2016  Check In:     Session Check In - 03/16/16 0930    Check-In   Location [facility]   Staff Present Diane Angelina Pih, MS, EP, [facility], Exercise Physiologist;Gregory Luther Parody, BS, EP, Exercise Physiologist;Christy Oletta Lamas, RN, BSN   Supervising physician immediately available to respond to emergencies See telemetry face sheet for immediately available MD   Medication changes reported     No   Fall or balance concerns reported    No   Warm-up and Cool-down Performed as group-led instruction   Resistance Training Performed Yes   VAD Patient? No   Pain Assessment   Currently in Pain? No/denies   Pain Score 0-No pain   Multiple Pain Sites No      Capillary Blood Glucose: No results found for this or any previous visit (from the past 24 hour(s)).      Exercise Prescription Changes - 03/16/16 0800    Exercise Review   Progression Yes   Response to Exercise   Blood Pressure (Admit) 120/72 mmHg   Blood Pressure (Exercise) 118/62 mmHg   Blood Pressure (Exit) 98/64 mmHg   Heart Rate (Admit) 76 bpm   Heart Rate (Exercise) 78 bpm   Heart Rate (Exit) 78 bpm   Rating of Perceived Exertion (Exercise) 11   Symptoms No   Progression   Progression Continue to progress workloads to maintain intensity without signs/symptoms of physical distress.   Resistance Training   Training Prescription Yes   Weight 5.0   Reps 10-12   Interval Training   Interval Training No   Treadmill   MPH 2.8   Grade 0.5   Minutes 15   METs 3.3   NuStep   Level 3   Minutes 15   METs 3.58   Home Exercise Plan   Plans to continue exercise at Home   Frequency Add 2 additional days to program exercise sessions.     Goals Met:  Independence with exercise equipment Exercise tolerated well No report of cardiac concerns or symptoms Strength  training completed today  Goals Unmet:  Not Applicable  Comments: Check   Dr. Kate Sable is Medical Director for [place] and Pulmonary Rehab.

## 2016-03-18 ENCOUNTER — Encounter (HOSPITAL_COMMUNITY)
Admission: RE | Admit: 2016-03-18 | Discharge: 2016-03-18 | Disposition: A | Discharge status: Home / Self Care | Payer: No Typology Code available for payment source | Source: Ambulatory Visit | Attending: Family Medicine | Admitting: Family Medicine

## 2016-03-18 DIAGNOSIS — I213 ST elevation (STEMI) myocardial infarction of unspecified site: Secondary | ICD-10-CM | POA: Diagnosis not present

## 2016-03-18 NOTE — Progress Notes (Signed)
Daily Session Note  Patient Details  Name: LAKE CINQUEMANI MRN: [id number] Date of Birth: 30-Jun-1956 Referring Provider:    Encounter Date: 03/18/2016  Check In:     Session Check In - 03/18/16 0930    Check-In   Location [facility]   Staff Present Diane Angelina Pih, MS, EP, [facility], Exercise Physiologist;Gregory Luther Parody, BS, EP, Exercise Physiologist;Christy Oletta Lamas, RN, BSN   Supervising physician immediately available to respond to emergencies See telemetry face sheet for immediately available MD   Medication changes reported     No   Fall or balance concerns reported    No   Warm-up and Cool-down Performed as group-led instruction   Resistance Training Performed Yes   VAD Patient? No   Pain Assessment   Currently in Pain? No/denies   Pain Score 0-No pain   Multiple Pain Sites No      Capillary Blood Glucose: No results found for this or any previous visit (from the past 24 hour(s)).   Goals Met:  Independence with exercise equipment Exercise tolerated well No report of cardiac concerns or symptoms Strength training completed today  Goals Unmet:  Not Applicable  Comments: Check out 1030   Dr. Kate Sable is Medical Director for [place] and Pulmonary Rehab.

## 2016-03-21 ENCOUNTER — Encounter (HOSPITAL_COMMUNITY)
Admission: RE | Admit: 2016-03-21 | Discharge: 2016-03-21 | Disposition: A | Discharge status: Home / Self Care | Payer: No Typology Code available for payment source | Source: Ambulatory Visit | Attending: Family Medicine | Admitting: Family Medicine

## 2016-03-21 DIAGNOSIS — I213 ST elevation (STEMI) myocardial infarction of unspecified site: Secondary | ICD-10-CM | POA: Diagnosis not present

## 2016-03-21 NOTE — Progress Notes (Signed)
Daily Session Note  Patient Details  Name: Grant Cardenas MRN: [id number] Date of Birth: 11-28-1955 Referring Provider:    Encounter Date: 03/21/2016  Check In:     Session Check In - 03/21/16 0930    Check-In   Location [facility]   Staff Present Diane Angelina Pih, MS, EP, [facility], Exercise Physiologist;Gregory Luther Parody, BS, EP, Exercise Physiologist;Christy Oletta Lamas, RN, BSN   Supervising physician immediately available to respond to emergencies See telemetry face sheet for immediately available MD   Medication changes reported     No   Fall or balance concerns reported    No   Warm-up and Cool-down Performed as group-led instruction   Resistance Training Performed Yes   VAD Patient? No   Pain Assessment   Currently in Pain? No/denies   Pain Score 0-No pain   Multiple Pain Sites No      Capillary Blood Glucose: No results found for this or any previous visit (from the past 24 hour(s)).   Goals Met:  Independence with exercise equipment Exercise tolerated well No report of cardiac concerns or symptoms Strength training completed today  Goals Unmet:  Not Applicable  Comments: Check out 1030   Dr. Kate Sable is Medical Director for [place] and Pulmonary Rehab.

## 2016-03-23 ENCOUNTER — Encounter (HOSPITAL_COMMUNITY)
Admission: RE | Admit: 2016-03-23 | Discharge: 2016-03-23 | Disposition: A | Discharge status: Home / Self Care | Payer: No Typology Code available for payment source | Source: Ambulatory Visit | Attending: Family Medicine | Admitting: Family Medicine

## 2016-03-23 VITALS — Ht 73.0 in | Wt 186.5 lb

## 2016-03-23 DIAGNOSIS — I213 ST elevation (STEMI) myocardial infarction of unspecified site: Secondary | ICD-10-CM | POA: Diagnosis not present

## 2016-03-23 DIAGNOSIS — I2102 ST elevation (STEMI) myocardial infarction involving left anterior descending coronary artery: Secondary | ICD-10-CM

## 2016-03-23 NOTE — Progress Notes (Signed)
Daily Session Note  Patient Details  Name: Grant Cardenas MRN: [id number] Date of Birth: Oct 31, 1955 Referring Provider:    Encounter Date: 03/23/2016  Check In:     Session Check In - 03/23/16 0930    Check-In   Location [facility]   Staff Present Diane Angelina Pih, MS, EP, [facility], Exercise Physiologist;Gregory Luther Parody, BS, EP, Exercise Physiologist   Supervising physician immediately available to respond to emergencies See telemetry face sheet for immediately available MD   Medication changes reported     No   Fall or balance concerns reported    No   Warm-up and Cool-down Performed as group-led instruction   Resistance Training Performed Yes   VAD Patient? No   Pain Assessment   Currently in Pain? No/denies   Pain Score 0-No pain   Multiple Pain Sites No      Capillary Blood Glucose: No results found for this or any previous visit (from the past 24 hour(s)).   Goals Met:  Independence with exercise equipment Exercise tolerated well No report of cardiac concerns or symptoms Strength training completed today  Goals Unmet:  Not Applicable  Comments: Check out 1030   Dr. Kate Sable is Medical Director for [place] and Pulmonary Rehab.

## 2016-03-25 ENCOUNTER — Encounter (HOSPITAL_COMMUNITY): Payer: No Typology Code available for payment source

## 2016-03-25 ENCOUNTER — Telehealth: Payer: Self-pay | Admitting: Cardiovascular Disease

## 2016-03-25 NOTE — Telephone Encounter (Signed)
Eden calling from [place] stating they never received the referral for pulmonary for patient.  She was asking if we can re-fax it along with pt SS number and DOB  This time if we can send it to her and the [place] [facility] program.   Fax: [phone] Attn: [facility]  Fax: [phone]

## 2016-03-25 NOTE — Telephone Encounter (Signed)
Faxed letter again to [facility], attn: [place], [phone] and [place] [facility], [phone]

## 2016-03-28 ENCOUNTER — Encounter (HOSPITAL_COMMUNITY): Payer: No Typology Code available for payment source

## 2016-04-25 NOTE — Addendum Note (Signed)
Encounter addended by: Dwana Melena, RN on: 04/25/2016  1:38 PM<BR>    Actions taken: Visit diagnoses modified, Visit Navigator Flowsheet section accepted, Sign clinical note, Episode resolved

## 2016-04-25 NOTE — Progress Notes (Signed)
Discharge Summary  Patient Details  Name: Grant Cardenas MRN: [id number] Date of Birth: [birth year] Referring Provider:     Number of Visits: 36  Reason for Discharge:  Patient reached a stable level of exercise. Patient independent in their exercise.  Smoking History:  History  Smoking Status  . Former Smoker  . Years: 40.00  . Types: Cigarettes  . Quit date: 09/14/2015  Smokeless Tobacco  . 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     Resistance Training   Training Prescription Yes   Weight 1.0   Reps 10-12      Discharge Exercise Prescription (Final Exercise Prescription Changes):     Exercise Prescription Changes - 03/31/16 0800      Exercise Review   Progression Yes     Response to Exercise   Blood Pressure (Admit) 104/62   Blood Pressure (Exercise) 122/60   Blood Pressure (Exit) 120/72   Heart Rate (Admit) 65 bpm   Heart Rate (Exercise) 96 bpm   Heart Rate (Exit) 74 bpm   Rating of Perceived Exertion (Exercise) 11   Symptoms No   Comments Patinet's last session     Progression   Progression Continue to progress workloads to maintain intensity without signs/symptoms of physical distress.     Resistance Training   Training Prescription Yes   Weight 5.0   Reps 10-12     Interval Training    Interval Training No     Treadmill   MPH 2.8   Grade 0.5   Minutes 15   METs 3.3     NuStep   Level 3   Minutes 20   METs 3.58     Home Exercise Plan   Plans to continue exercise at Home   Frequency Add 2 additional days to program exercise sessions.      Functional Capacity:     6 Minute Walk    Row Name 12/22/15 1506 03/23/16 1409       6 Minute Walk   Phase Initial Discharge    Distance 1350 feet 1850 feet    Distance % Change  - 37.04 %    Walk Time 6 minutes 6 minutes    # of Rest Breaks 0 0    MPH 2.56 3.5    METS 2.96 3.68    RPE 11 11    Perceived Dyspnea  13 14    VO2 Peak 12.54 16.12    Symptoms No No    Resting HR 73 bpm 65 bpm    Resting BP 108/60 100/56    Max Ex. HR 88 bpm 97 bpm    Max Ex. BP 120/72  130/64    2 Minute Post BP 106/68 114/60      Pre/Post BP   Baseline BP 108/60  -    6 Minute BP 120/72  -    Pre/Post BP? Yes  -       Psychological, QOL, Others - Outcomes: PHQ 2/9: Depression screen [facility] 2/9 04/25/2016 12/22/2015  Decreased Interest 0 0  Down, Depressed, Hopeless 0 0  PHQ - 2 Score 0 0  Altered sleeping 0 0  Tired, decreased energy 0 0  Change in appetite 0 0  Feeling bad or failure about yourself  0 0  Trouble concentrating 0 0  Moving slowly or fidgety/restless 0 0  Suicidal thoughts 0 0  PHQ-9 Score 0 0  Difficult doing work/chores - Not difficult at all    Quality of Life:     Quality of Life - 03/23/16 1414      Quality of Life Scores   Health/Function Pre 10.03 %   Health/Function Post 9.6 %   Health/Function % Change -4.29 %   Socioeconomic Pre 19.86 %   Socioeconomic Post 25 %   Socioeconomic % Change  25.88 %   Psych/Spiritual Pre 14.93 %   Psych/Spiritual Post 17.14 %   Psych/Spiritual % Change 14.8 %   Family Pre 12.88 %   Family Post 12 %   Family % Change -6.83 %   GLOBAL Pre 13.5 %   GLOBAL Post 14.44 %   GLOBAL % Change 6.96 %      Personal Goals: Goals established at orientation  with interventions provided to work toward goal.     Personal Goals and Risk Factors at Admission - 12/22/15 1601      Core Components/Risk Factors/Patient Goals on Admission   Improve shortness of breath with ADL's Yes   Intervention Provide education, individualized exercise plan and daily activity instruction to help decrease symptoms of SOB with activities of daily living.   Expected Outcomes Short Term: Achieves a reduction of symptoms when performing activities of daily living.   Develop more efficient breathing techniques such as purse lipped breathing and diaphragmatic breathing; and practicing self-pacing with activity Yes   Intervention Provide education, demonstration and support about specific breathing techniuqes utilized for more efficient breathing. Include techniques such as pursed lipped breathing, diaphragmatic breathing and self-pacing activity.   Expected Outcomes Short Term: Participant will be able to demonstrate and use breathing techniques as needed throughout daily activities.   Hypertension Yes   Intervention Monitor prescription use compliance.;Provide education on lifestyle modifcations including regular physical activity/exercise, weight management, moderate sodium restriction and increased consumption of fresh fruit, vegetables, and low fat dairy, alcohol moderation, and smoking cessation.   Expected Outcomes Long Term: Maintenance of blood pressure at goal levels.   Lipids Yes   Intervention Provide education and support for participant on nutrition & aerobic/resistive exercise along with prescribed medications to achieve LDL 70mg , HDL >40mg .   Expected Outcomes Long Term: Cholesterol controlled with medications as prescribed, with individualized exercise RX and with personalized nutrition plan. Value goals: LDL < 70mg , HDL > 40 mg.       Personal Goals Discharge:     Goals and Risk Factor Review    Row Name 01/29/16 0840 02/22/16 1502 04/25/16 1319          Core Components/Risk Factors/Patient Goals Review   Personal Goals Review Improve shortness of breath with ADL's;Hypertension;Develop more efficient breathing techniques such as purse lipped breathing and diaphragmatic breathing and practicing  self-pacing with activity.;Lipids Improve shortness of breath with ADL's;Hypertension;Develop more efficient breathing techniques such as purse lipped breathing and diaphragmatic breathing and practicing self-pacing with activity.;Lipids  -     Review BP has been controlled during program.  patient stated he is breathing a little better.  BP has been controlled during program.  patient stated he is breathing a little better. Patient can demonstrate pursed lipped breathing.  Upon graduation, patient achieved both his short and long term goals of breathing better, increased energy, BP WNL, and being able to do his ADL's and hobbies. He maintained his weight during the program.      Expected Outcomes increase energy, play golf again.  increase energy, play golf again. BP WNL, breathe better.  Continue to have increased energy and the ablity to do his ADL's and hobbies.         Nutrition & Weight - Outcomes:     Pre Biometrics - 12/22/15 1537      Pre Biometrics   Height 6\' 1"  (1.854 m)   Weight 186 lb 8 oz (84.6 kg)   Waist Circumference 35 inches   Hip Circumference 38 inches   Waist to Hip Ratio 0.92 %   BMI (Calculated) 24.7   Triceps Skinfold 9 mm   % Body Fat 21.3 %   Grip Strength 100 kg   Flexibility 12 in   Single Leg Stand 60 seconds         Post Biometrics - 03/23/16 1412       Post  Biometrics   Height 6\' 1"  (1.854 m)   Weight 186 lb 8.2 oz (84.6 kg)   Waist Circumference 35 inches   Hip Circumference 37.5 inches   Waist to Hip Ratio 0.93 %   BMI (Calculated) 24.7   Triceps Skinfold 10 mm   % Body Fat 21.8 %   Grip Strength 107.6 kg   Flexibility 16.9 in   Single Leg Stand 36 seconds      Nutrition:     Nutrition  Therapy & Goals - 12/22/15 1601      Intervention Plan   Intervention Nutrition handout(s) given to patient.   Expected Outcomes Short Term Goal: Understand basic principles of dietary content, such as calories, fat, sodium, cholesterol and nutrients.      Nutrition Discharge:     Nutrition Assessments - 04/25/16 1315      MEDFICTS Scores   Pre Score 6   Post Score 3   Score Difference -3      Education Questionnaire Score:     Knowledge Questionnaire Score - 04/25/16 1315      Knowledge Questionnaire Score   Pre Score 17/24   Post Score 24/24      Goals reviewed with patient; copy given to patient.

## 2016-06-20 ENCOUNTER — Telehealth: Payer: Self-pay | Admitting: Cardiovascular Disease

## 2016-06-20 NOTE — Telephone Encounter (Signed)
Requested additional services from [facility].   For Dr. Fletcher Anon fu scheduled in December.

## 2016-07-11 ENCOUNTER — Encounter: Payer: Self-pay | Admitting: General Practice

## 2016-09-08 ENCOUNTER — Ambulatory Visit (INDEPENDENT_AMBULATORY_CARE_PROVIDER_SITE_OTHER): Payer: No Typology Code available for payment source | Admitting: Cardiovascular Disease

## 2016-09-08 ENCOUNTER — Encounter: Payer: Self-pay | Admitting: Cardiovascular Disease

## 2016-09-08 VITALS — BP 136/86 | HR 49 | Ht 73.0 in | Wt 198.2 lb

## 2016-09-08 DIAGNOSIS — I255 Ischemic cardiomyopathy: Secondary | ICD-10-CM

## 2016-09-08 DIAGNOSIS — E784 Other hyperlipidemia: Secondary | ICD-10-CM

## 2016-09-08 DIAGNOSIS — E7849 Other hyperlipidemia: Secondary | ICD-10-CM

## 2016-09-08 DIAGNOSIS — I2510 Atherosclerotic heart disease of native coronary artery without angina pectoris: Secondary | ICD-10-CM | POA: Diagnosis not present

## 2016-09-08 MED ORDER — CARVEDILOL 3.125 MG PO TABS: 3.1250 mg | ORAL_TABLET | Freq: Two times a day (BID) | ORAL | 6 refills | Status: DC

## 2016-09-08 NOTE — Progress Notes (Signed)
Cardiology Office Note   Date:  09/08/2016   ID:  Grant Cardenas, DOB 04-03-1956, MRN [id number]  PCP:  Alonza Bogus, MD  Cardiologist:   Kathlyn Sacramento, MD   Chief Complaint  Patient presents with  . other    79mo f/u. 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 Reviewed meds with pt verbally.      History of Present Illness: Grant Cardenas is a 60 y.o. male who presents for  a follow-up visit regarding coronary artery disease and ischemic cardiomyopathy.  He presented in December 2016 with anterior ST elevation myocardial infarction. Cardiac catheterization showed occluded proximal LAD and 60% ostial RCA stenosis. There was also very distal disease affecting the LAD and ramus. He underwent successful angioplasty and drug-eluting stent placement to the LAD. Ejection fraction was 35-40% which improved subsequently to low- normal. He had suffered from recurrent chest pain and anxiety since then. He was hospitalized in January for chest pain and underwent cardiac catheterization which showed patent proximal LAD stent with unchanged disease. Ejection fraction was 50%.  Heartburn and belching improved with Protonix.  His other medical problems include psoriasis on methotrexate and hyperlipidemia. He attended cardiac rehabilitation. He was diagnosed with advanced COPD and started on inhalers. He is supposed to get a sleep study but that has not been approved yet. He has been doing well otherwise with no chest pain. Shortness of breath is stable. His appetite has improved and has gained some weight.  Past Medical History:  Diagnosis Date  . CAD (coronary artery disease)    a. anterior ST elevation MI in 09/2015. Cardiac cath showed occluded prox LAD and 60% ostial RCA stenosis. There was also very distal disease affecting the LAD and ramus. He underwent successful angioplasty and drug-eluting stent placement to  the LAD. Ejection fraction was 35-40% which improved subsequently an echo to 55-60%.  Marland Kitchen COPD (chronic obstructive pulmonary disease) ([place]) 03/2016   4th stage-Dr. Luan Pulling  . Hyperlipidemia   . Ischemic cardiomyopathy    a. EF by cath 09/13/2015: 35-40%; b. 09/14/2015: EF 55-60%, Probable akinesis of  the midanteroseptal myocardium. akinesis of the apicalanterior myocardium, GR1DD  . MI (myocardial infarction)   . Psoriasis   . Tobacco use   . Vitreous floaters of both eyes     Past Surgical History:  Procedure Laterality Date  . CARDIAC CATHETERIZATION N/A 09/13/2015   Procedure: Left Heart Cath and Coronary Angiography;  Surgeon: Wellington Hampshire, MD;  Location: [facility];  Service: Cardiovascular;  Laterality: N/A;  . CARDIAC CATHETERIZATION N/A 09/13/2015   Procedure: Coronary Stent Intervention;  Surgeon: Wellington Hampshire, MD;  Location: [facility];  Service: Cardiovascular;  Laterality: N/A;  . CARDIAC CATHETERIZATION N/A 11/03/2015   Procedure: Left Heart Cath and Coronary Angiography;  Surgeon: Peter M Martinique, MD;  Location: [facility];  Service: Cardiovascular;  Laterality: N/A;  . COLONOSCOPY N/A 01/29/2016   Procedure: COLONOSCOPY;  Surgeon: Danie Binder, MD;  [facility];  Service: Endoscopy;  Laterality: N/A;  1030-moved to 1215 office to notify   . ESOPHAGOGASTRODUODENOSCOPY N/A 01/29/2016   Procedure: ESOPHAGOGASTRODUODENOSCOPY (EGD);  Surgeon: Danie Binder, MD;  [facility];  Service: Endoscopy;  Laterality: N/A;     Current Outpatient Prescriptions  Medication Sig Dispense Refill  . aspirin EC 81 MG tablet Take 1 tablet (81  mg total) by mouth daily. 90 tablet 3  . atorvastatin (LIPITOR) 40 MG tablet Take 1 tablet (40 mg total) by mouth daily at 6 PM. 30 tablet 6  . carvedilol (COREG) 6.25 MG tablet Take 1 tablet (6.25 mg total) by mouth 2 (two) times daily with a meal. 30 tablet 1  . clobetasol ointment (TEMOVATE) [id number]  % Apply 1 application topically 2 (two) times daily.   1  . folic acid (FOLVITE) 1 MG tablet Take 1 mg by mouth See admin instructions. Takes 1 tab daily except Fridays  3  . methotrexate (RHEUMATREX) 2.5 MG tablet Take 25 mg by mouth every Friday.     . nitroGLYCERIN (NITROSTAT) 0.4 MG SL tablet Place 1 tablet (0.4 mg total) under the tongue every 5 (five) minutes as needed for chest pain. 25 tablet 12  . pantoprazole (PROTONIX) 40 MG tablet Take 40 mg by mouth daily with breakfast.  5  . prasugrel (EFFIENT) 10 MG TABS tablet Take 1 tablet (10 mg total) by mouth daily. 30 tablet 6  . umeclidinium-vilanterol (ANORO ELLIPTA) 62.5-25 MCG/INH AEPB Inhale 1 puff into the lungs daily.     No current facility-administered medications for this visit.     Allergies:   Patient has no known allergies.    Social History:  The patient  reports that he quit smoking about a year ago. His smoking use included Cigarettes. He quit after 40.00 years of use. He has never used smokeless tobacco. He reports that he does not drink alcohol or use drugs.   Family History:  The patient's family history includes Cancer in his other; Colon cancer in his maternal aunt; Heart Problems in his mother; Heart attack in his father; Heart disease in his father.      PHYSICAL EXAM: VS:  Ht 6\' 1"  (1.854 m)   Wt 198 lb 4 oz (89.9 kg)   BMI 26.16 kg/m  , BMI Body mass index is 26.16 kg/m. GEN: Well nourished, well developed, in no acute distress  HEENT: normal  Neck: no JVD, carotid bruits, or masses Cardiac: RRR; no murmurs, rubs, or gallops,no edema  Respiratory:  clear to auscultation bilaterally, normal work of breathing GI: soft, nontender, nondistended, + BS MS: no deformity or atrophy  Skin: warm and dry, no rash Neuro:  Strength and sensation are intact Psych: euthymic mood, full affect   EKG:  EKG  ordered today. EKG showed sinus bradycardia with evidence of prior septal infarct with anterolateral T wave  changes.   Recent Labs: 09/14/2015: B Natriuretic Peptide 77.1 11/02/2015: BUN 15; Creatinine, Ser 1.13; Hemoglobin 15.1; Platelets 218; Potassium 4.5; Sodium 139 11/26/2015: ALT 90    Lipid Panel    Component Value Date/Time   CHOL 153 11/26/2015 0815   CHOL 212 (H) 09/23/2015 1540   TRIG 85 11/26/2015 0815   HDL 41 11/26/2015 0815   HDL 41 09/23/2015 1540   CHOLHDL 3.7 11/26/2015 0815   VLDL 17 11/26/2015 0815   LDLCALC 95 11/26/2015 0815   LDLCALC 137 (H) 09/23/2015 1540      Wt Readings from Last 3 Encounters:  09/08/16 198 lb 4 oz (89.9 kg)  03/23/16 186 lb 8.2 oz (84.6 kg)  02/25/16 187 lb 8 oz (85 kg)        ASSESSMENT AND PLAN:  1.  Coronary artery disease involving native coronary arteries of native heart Without angina :  Continue dual antiplatelet therapy for another year.  2.  GERD: Improved with Protonix  40 mg once daily. The patient has been seen by GI.  3.  Ischemic cardiomyopathy: Most recent ejection fraction was 45-55%. I decreased the dose of carvedilol to 3.125 mg twice daily due to bradycardia.  4. Hyperlipidemia :  Continue treatment with atorvastatin 40 mg daily. This is being monitored by his primary care physician.  5. COPD: Managed by Dr. Luan Pulling.    Disposition:   FU with me in 6 months  Signed,  Kathlyn Sacramento, MD  09/08/2016 11:35 AM    [facility]

## 2016-09-08 NOTE — Patient Instructions (Signed)
Medication Instructions:  Your physician has recommended you make the following change in your medication:  DECREASE coreg to 3.125mg  twice daily   Labwork: none  Testing/Procedures: none  Follow-Up: Your physician wants you to follow-up in: 6 months with Dr. Fletcher Anon.  You will receive a reminder letter in the mail two months in advance. If you don't receive a letter, please call our office to schedule the follow-up appointment.   Any Other Special Instructions Will Be Listed Below (If Applicable).     If you need a refill on your cardiac medications before your next appointment, please call your pharmacy.

## 2016-09-14 DIAGNOSIS — E785 Hyperlipidemia, unspecified: Secondary | ICD-10-CM | POA: Insufficient documentation

## 2016-09-14 DIAGNOSIS — I25119 Atherosclerotic heart disease of native coronary artery with unspecified angina pectoris: Secondary | ICD-10-CM | POA: Insufficient documentation

## 2016-09-14 DIAGNOSIS — Z720 Tobacco use: Secondary | ICD-10-CM | POA: Insufficient documentation

## 2016-09-16 ENCOUNTER — Telehealth: Payer: Self-pay | Admitting: Cardiovascular Disease

## 2016-09-16 NOTE — Telephone Encounter (Signed)
Patient received bill for December 7th appointment with Dr. Fletcher Anon.  Claim was not filed with [facility].  [facility] was selected at checkin.  This was changed via epic.  Patient family wants to make sure we re file a claim.

## 2016-11-01 ENCOUNTER — Other Ambulatory Visit (HOSPITAL_BASED_OUTPATIENT_CLINIC_OR_DEPARTMENT_OTHER): Payer: Self-pay

## 2016-11-01 DIAGNOSIS — G4730 Sleep apnea, unspecified: Secondary | ICD-10-CM

## 2016-11-01 DIAGNOSIS — G4710 Hypersomnia, unspecified: Secondary | ICD-10-CM

## 2016-11-02 ENCOUNTER — Telehealth: Payer: Self-pay

## 2016-11-02 MED ORDER — PRASUGREL HCL 10 MG PO TABS: 10.0000 mg | ORAL_TABLET | Freq: Every day | ORAL | 6 refills | Status: DC

## 2016-11-02 NOTE — Telephone Encounter (Signed)
Pt wife would like a written rx for Effient 10 mg, mailed to pt if possible. Please call when ready.

## 2016-11-02 NOTE — Telephone Encounter (Signed)
Rx printed and mailed to the patient.

## 2016-11-13 ENCOUNTER — Ambulatory Visit: Payer: Non-veteran care | Attending: Internal Medicine | Admitting: Internal Medicine

## 2016-11-13 DIAGNOSIS — G4710 Hypersomnia, unspecified: Secondary | ICD-10-CM | POA: Insufficient documentation

## 2016-11-13 DIAGNOSIS — G4730 Sleep apnea, unspecified: Secondary | ICD-10-CM | POA: Diagnosis not present

## 2016-11-13 DIAGNOSIS — G4761 Periodic limb movement disorder: Secondary | ICD-10-CM | POA: Insufficient documentation

## 2016-11-13 DIAGNOSIS — R0683 Snoring: Secondary | ICD-10-CM | POA: Diagnosis present

## 2016-11-18 ENCOUNTER — Telehealth: Payer: Self-pay | Admitting: Cardiovascular Disease

## 2016-11-18 NOTE — Telephone Encounter (Signed)
Sent request for additional services form to [employer].  Patient spouse peggy is aware.

## 2016-12-30 NOTE — Procedures (Signed)
   Patient Name: Grant Cardenas, Grant Cardenas Date: 11/13/2016 Gender: Male D.O.B: 1956/01/06 Age (years): 67 Referring Provider: Mali Marion MD Height (inches): 73 Interpreting Physician: Baird Lyons MD, ABSM Weight (lbs): 198 RPSGT: Rosebud Poles BMI: 26 MRN: [id number] Neck Size: 17.00 CLINICAL INFORMATION Sleep Study Type: NPSG  Indication for sleep study: Excessive Daytime Sleepiness, Witnessed Apneas  Epworth Sleepiness Score: 9  SLEEP STUDY TECHNIQUE As per the AASM Manual for the Scoring of Sleep and Associated Events v2.3 (April 2016) with a hypopnea requiring 4% desaturations.  The channels recorded and monitored were frontal, central and occipital EEG, electrooculogram (EOG), submentalis EMG (chin), nasal and oral airflow, thoracic and abdominal wall motion, anterior tibialis EMG, snore microphone, electrocardiogram, and pulse oximetry.  MEDICATIONS Medications self-administered by patient taken the night of the study : none reported  SLEEP ARCHITECTURE The study was initiated at 10:35:25 PM and ended at 5:11:17 AM.  Sleep onset time was 2.8 minutes and the sleep efficiency was 88.6%. The total sleep time was 350.6 minutes.  Stage REM latency was 47.0 minutes.  The patient spent 3.42% of the night in stage N1 sleep, 62.77% in stage N2 sleep, 8.70% in stage N3 and 25.10% in REM.  Alpha intrusion was absent.  Supine sleep was 64.15%.  RESPIRATORY PARAMETERS The overall apnea/hypopnea index (AHI) was 3.3 per hour. There were 7 total apneas, including 1 obstructive, 2 central and 4 mixed apneas. There were 12 hypopneas and 2 RERAs.  The AHI during Stage REM sleep was 1.4 per hour.  AHI while supine was 3.7 per hour.  The mean oxygen saturation was 95.32%. The minimum SpO2 during sleep was 92.00%.  Moderate snoring was noted during this study.  CARDIAC DATA The 2 lead EKG demonstrated sinus rhythm. The mean heart rate was N/A beats per minute. Other EKG  findings include: PVCs.  LEG MOVEMENT DATA The total PLMS were 227 with a resulting PLMS index of 38.85. Associated arousal with leg movement index was 10.6 .  IMPRESSIONS - No significant obstructive sleep apnea occurred during this study (AHI = 3.3/h). - No significant central sleep apnea occurred during this study (CAI = 0.3/h). - The patient had minimal or no oxygen desaturation during the study (Min O2 = 92.00%) - The patient snored with Moderate snoring volume. - EKG findings include PVCs. - Moderate periodic limb movements of sleep occurred during the study. Associated arousals were significant.  DIAGNOSIS - Periodic Limb Movement Syndrome (327.51 [G47.61 ICD-10])  RECOMMENDATIONS - Consider therapeutic trial of therapy for limb-movement sleep disorder, such as Requip or Mirapex, if appropriate. - Be careful with alcohol, sedatives and other CNS depressants that may worsen sleep apnea and disrupt normal sleep architecture. - Sleep hygiene should be reviewed to assess factors that may improve sleep quality. - Weight management and regular exercise should be initiated or continued if appropriate.  [Electronically signed] 12/30/2016 11:36 AM  Baird Lyons MD, ABSM Diplomate, American Board of Sleep Medicine   NPI: [id number] [place], American Board of Sleep Medicine  ELECTRONICALLY SIGNED ON:  12/30/2016, 11:32 AM [place] PH: [phone]   FX: [phone] [place]

## 2017-01-03 ENCOUNTER — Encounter: Payer: Self-pay | Admitting: Gastroenterology

## 2017-02-21 ENCOUNTER — Telehealth: Payer: Self-pay | Admitting: Cardiovascular Disease

## 2017-02-21 NOTE — Telephone Encounter (Signed)
Received records request, [facility] forwarded to [facility] for processing.

## 2017-03-16 ENCOUNTER — Encounter: Payer: Self-pay | Admitting: Cardiovascular Disease

## 2017-03-16 ENCOUNTER — Ambulatory Visit (INDEPENDENT_AMBULATORY_CARE_PROVIDER_SITE_OTHER): Payer: No Typology Code available for payment source | Admitting: Cardiovascular Disease

## 2017-03-16 VITALS — BP 120/70 | HR 51 | Ht 73.0 in | Wt 179.5 lb

## 2017-03-16 DIAGNOSIS — I255 Ischemic cardiomyopathy: Secondary | ICD-10-CM

## 2017-03-16 DIAGNOSIS — R0989 Other specified symptoms and signs involving the circulatory and respiratory systems: Secondary | ICD-10-CM | POA: Diagnosis not present

## 2017-03-16 DIAGNOSIS — I2510 Atherosclerotic heart disease of native coronary artery without angina pectoris: Secondary | ICD-10-CM | POA: Diagnosis not present

## 2017-03-16 DIAGNOSIS — E785 Hyperlipidemia, unspecified: Secondary | ICD-10-CM

## 2017-03-16 NOTE — Progress Notes (Signed)
Cardiology Office Note   Date:  03/16/2017   ID:  [name], DOB 1956-06-05, MRN [id number]  PCP:  Sinda Du, MD  Cardiologist:   Kathlyn Sacramento, MD   Chief Complaint  Patient presents with  . other    6 month follow up. Meds reviewed by the pt. verbally. "doing well."       History of Present Illness: Grant Cardenas is a 61 y.o. male who presents for  a follow-up visit regarding coronary artery disease and ischemic cardiomyopathy.  He presented in December 2016 with anterior ST elevation myocardial infarction. Cardiac catheterization showed occluded proximal LAD and 60% ostial RCA stenosis. There was also very distal disease affecting the LAD and ramus. He underwent successful angioplasty and drug-eluting stent placement to the LAD. Ejection fraction was 35-40% which improved subsequently to 45%. His other medical problems include psoriasis on methotrexate, Previous tobacco use, COPD and hyperlipidemia. He has been doing reasonably well with no chest pain or shortness of breath. He had an echocardiogram done at the [place] which showed an EF of 45%. He was referred to a cardiologist there. Effient was discontinued. Small dose lisinopril was started.  Past Medical History:  Diagnosis Date  . CAD (coronary artery disease)    a. anterior ST elevation MI in 09/2015. Cardiac cath showed occluded prox LAD and 60% ostial RCA stenosis. There was also very distal disease affecting the LAD and ramus. He underwent successful angioplasty and drug-eluting stent placement to the LAD. Ejection fraction was 35-40% which improved subsequently an echo to 55-60%.  Marland Kitchen COPD (chronic obstructive pulmonary disease) ([place]) 03/2016   4th stage-Dr. Luan Pulling  . Hyperlipidemia   . Ischemic cardiomyopathy    a. EF by cath 09/13/2015: 35-40%; b. 09/14/2015: EF 55-60%, Probable akinesis of  the midanteroseptal myocardium. akinesis of the apicalanterior myocardium, GR1DD  . MI (myocardial infarction)  ([place])   . Psoriasis   . Tobacco use   . Vitreous floaters of both eyes     Past Surgical History:  Procedure Laterality Date  . CARDIAC CATHETERIZATION N/A 09/13/2015   Procedure: Left Heart Cath and Coronary Angiography;  Surgeon: Wellington Hampshire, MD;  Location: [facility];  Service: Cardiovascular;  Laterality: N/A;  . CARDIAC CATHETERIZATION N/A 09/13/2015   Procedure: Coronary Stent Intervention;  Surgeon: Wellington Hampshire, MD;  Location: [facility];  Service: Cardiovascular;  Laterality: N/A;  . CARDIAC CATHETERIZATION N/A 11/03/2015   Procedure: Left Heart Cath and Coronary Angiography;  Surgeon: Peter M Martinique, MD;  Location: [facility];  Service: Cardiovascular;  Laterality: N/A;  . COLONOSCOPY N/A 01/29/2016   Procedure: COLONOSCOPY;  Surgeon: Danie Binder, MD;  [facility];  Service: Endoscopy;  Laterality: N/A;  1030-moved to 1215 office to notify   . ESOPHAGOGASTRODUODENOSCOPY N/A 01/29/2016   Procedure: ESOPHAGOGASTRODUODENOSCOPY (EGD);  Surgeon: Danie Binder, MD;  [facility];  Service: Endoscopy;  Laterality: N/A;     Current Outpatient Prescriptions  Medication Sig Dispense Refill  . aspirin EC 81 MG tablet Take 1 tablet (81 mg total) by mouth daily. 90 tablet 3  . atorvastatin (LIPITOR) 40 MG tablet Take 1 tablet (40 mg total) by mouth daily at 6 PM. 30 tablet 6  . carvedilol (COREG) 3.125 MG tablet Take 1 tablet (3.125 mg total) by mouth 2 (two) times daily with a meal. 60 tablet 6  . clobetasol ointment (TEMOVATE) 8.54 % Apply 1 application topically 2 (  two) times daily.   1  . folic acid (FOLVITE) 1 MG tablet Take 1 mg by mouth See admin instructions. Takes 1 tab daily except Fridays  3  . lisinopril (PRINIVIL,ZESTRIL) 5 MG tablet Take 5 mg by mouth daily.    . metFORMIN (GLUCOPHAGE) 500 MG tablet Take by mouth 2 (two) times daily with a meal.    . methotrexate (RHEUMATREX) 2.5 MG tablet Take 25 mg by mouth every  Friday.     . nitroGLYCERIN (NITROSTAT) 0.4 MG SL tablet Place 1 tablet (0.4 mg total) under the tongue every 5 (five) minutes as needed for chest pain. 25 tablet 12  . pantoprazole (PROTONIX) 40 MG tablet Take 40 mg by mouth daily with breakfast.  5  . umeclidinium-vilanterol (ANORO ELLIPTA) 62.5-25 MCG/INH AEPB Inhale 1 puff into the lungs daily.     No current facility-administered medications for this visit.     Allergies:   Patient has no known allergies.    Social History:  The patient  reports that he quit smoking about 18 months ago. His smoking use included Cigarettes. He quit after 40.00 years of use. He has never used smokeless tobacco. He reports that he does not drink alcohol or use drugs.   Family History:  The patient's family history includes Cancer in his other; Colon cancer in his maternal aunt; Heart Problems in his mother; Heart attack in his father; Heart disease in his father.      PHYSICAL EXAM: VS:  BP 120/70 (BP Location: Left Arm, Patient Position: Sitting, Cuff Size: Normal)   Pulse (!) 51   Ht 6\' 1"  (1.854 m)   Wt 179 lb 8 oz (81.4 kg)   BMI 23.68 kg/m  , BMI Body mass index is 23.68 kg/m. GEN: Well nourished, well developed, in no acute distress  HEENT: normal  Neck: no JVD or masses. Right carotid bruit Cardiac: RRR; no murmurs, rubs, or gallops,no edema  Respiratory:  clear to auscultation bilaterally, normal work of breathing GI: soft, nontender, nondistended, + BS MS: no deformity or atrophy  Skin: warm and dry, no rash Neuro:  Strength and sensation are intact Psych: euthymic mood, full affect   EKG:  EKG  ordered today. EKG showed sinus bradycardia with evidence of prior septal infarct with anterolateral T wave changes.   Recent Labs: No results found for requested labs within last 8760 hours.    Lipid Panel    Component Value Date/Time   CHOL 153 11/26/2015 0815   CHOL 212 (H) 09/23/2015 1540   TRIG 85 11/26/2015 0815   HDL 41  11/26/2015 0815   HDL 41 09/23/2015 1540   CHOLHDL 3.7 11/26/2015 0815   VLDL 17 11/26/2015 0815   LDLCALC 95 11/26/2015 0815   LDLCALC 137 (H) 09/23/2015 1540      Wt Readings from Last 3 Encounters:  03/16/17 179 lb 8 oz (81.4 kg)  09/08/16 198 lb 4 oz (89.9 kg)  03/23/16 186 lb 8.2 oz (84.6 kg)        ASSESSMENT AND PLAN:  1.  Coronary artery disease involving native coronary arteries of native heart without angina :  He is doing well overall. Continue aspirin indefinitely.  2.  Ischemic cardiomyopathy: Most recent ejection fraction was 45%. Not able to increase the dose of carvedilol due to bradycardia. Continue lisinopril.  3. Hyperlipidemia :  Continue treatment with atorvastatin 40 mg daily. This is being monitored by his primary care physician.  4. Right carotid bruit: I  requested carotid Doppler.    Disposition:   FU with me in 6 months  Signed,  Kathlyn Sacramento, MD  03/16/2017 12:00 PM    [place]

## 2017-03-16 NOTE — Patient Instructions (Signed)
Medication Instructions:  Your physician recommends that you continue on your current medications as directed. Please refer to the Current Medication list given to you today.   Labwork: none  Testing/Procedures: Your physician has requested that you have a carotid duplex. This test is an ultrasound of the carotid arteries in your neck. It looks at blood flow through these arteries that supply the brain with blood. Allow one hour for this exam. There are no restrictions or special instructions.    Follow-Up: Your physician wants you to follow-up in: 6 months with Dr. Arida.  You will receive a reminder letter in the mail two months in advance. If you don't receive a letter, please call our office to schedule the follow-up appointment.   Any Other Special Instructions Will Be Listed Below (If Applicable).     If you need a refill on your cardiac medications before your next appointment, please call your pharmacy.   

## 2017-04-10 ENCOUNTER — Ambulatory Visit: Payer: No Typology Code available for payment source

## 2017-04-10 DIAGNOSIS — R0989 Other specified symptoms and signs involving the circulatory and respiratory systems: Secondary | ICD-10-CM | POA: Diagnosis not present

## 2017-04-10 DIAGNOSIS — I6523 Occlusion and stenosis of bilateral carotid arteries: Secondary | ICD-10-CM | POA: Diagnosis not present

## 2017-04-10 LAB — VAS US CAROTID
LEFT ECA DIAS: -16 cm/s
LEFT VERTEBRAL DIAS: -22 cm/s
Left CCA dist dias: -21 cm/s
Left CCA dist sys: -108 cm/s
Left CCA prox dias: 29 cm/s
Left CCA prox sys: 217 cm/s
Left ICA dist dias: -48 cm/s
Left ICA dist sys: -92 cm/s
Left ICA prox dias: -25 cm/s
Left ICA prox sys: -85 cm/s
RIGHT ECA DIAS: -8 cm/s
RIGHT VERTEBRAL DIAS: -9 cm/s
Right CCA prox dias: 16 cm/s
Right CCA prox sys: 121 cm/s
Right cca dist sys: -70 cm/s

## 2017-11-13 IMAGING — CR DG CHEST 1V PORT
1 series · 2 of 2 positions shown · non-contrast
Comparison: Chest CT September 04, 2003

CLINICAL DATA: Chest pain with myocardial infarction

EXAM:
PORTABLE CHEST 1 VIEW

[Series 1: ap portable · 0.17mm/px · 2 of 2 slices shown]
[im 1/2]
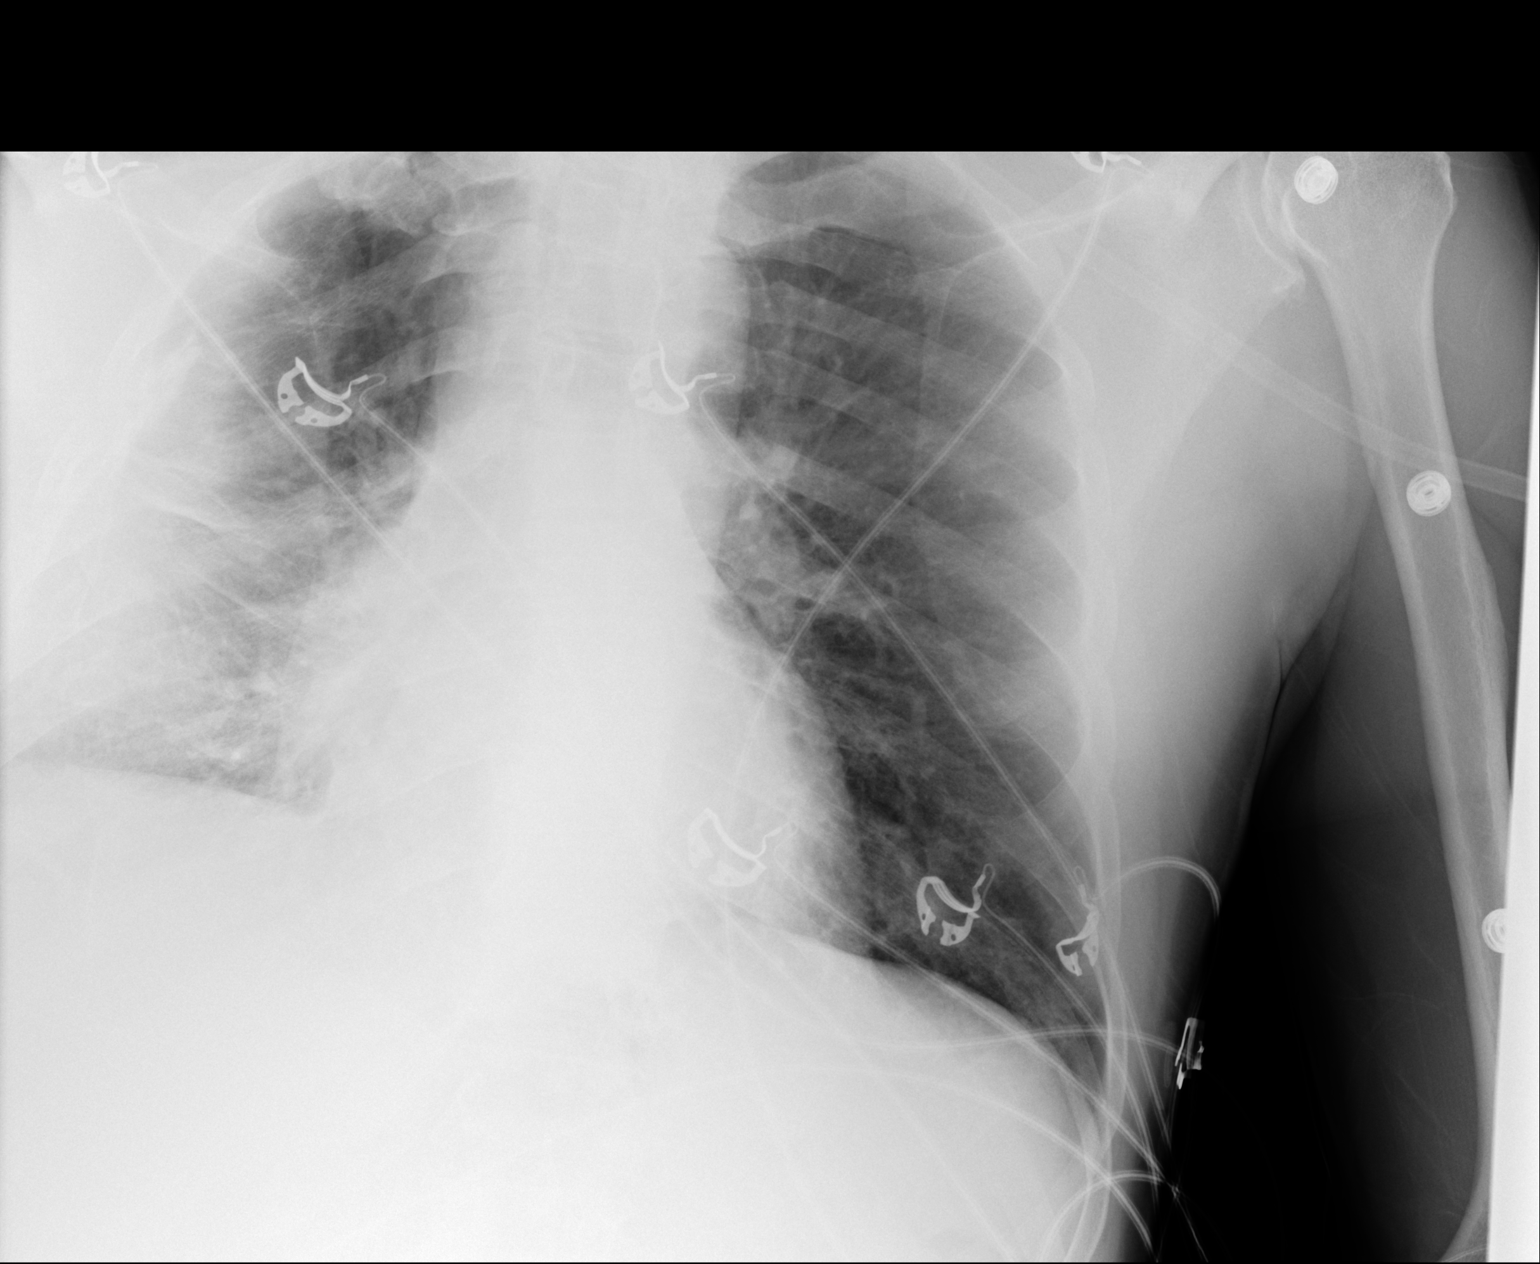
[im 2/2]
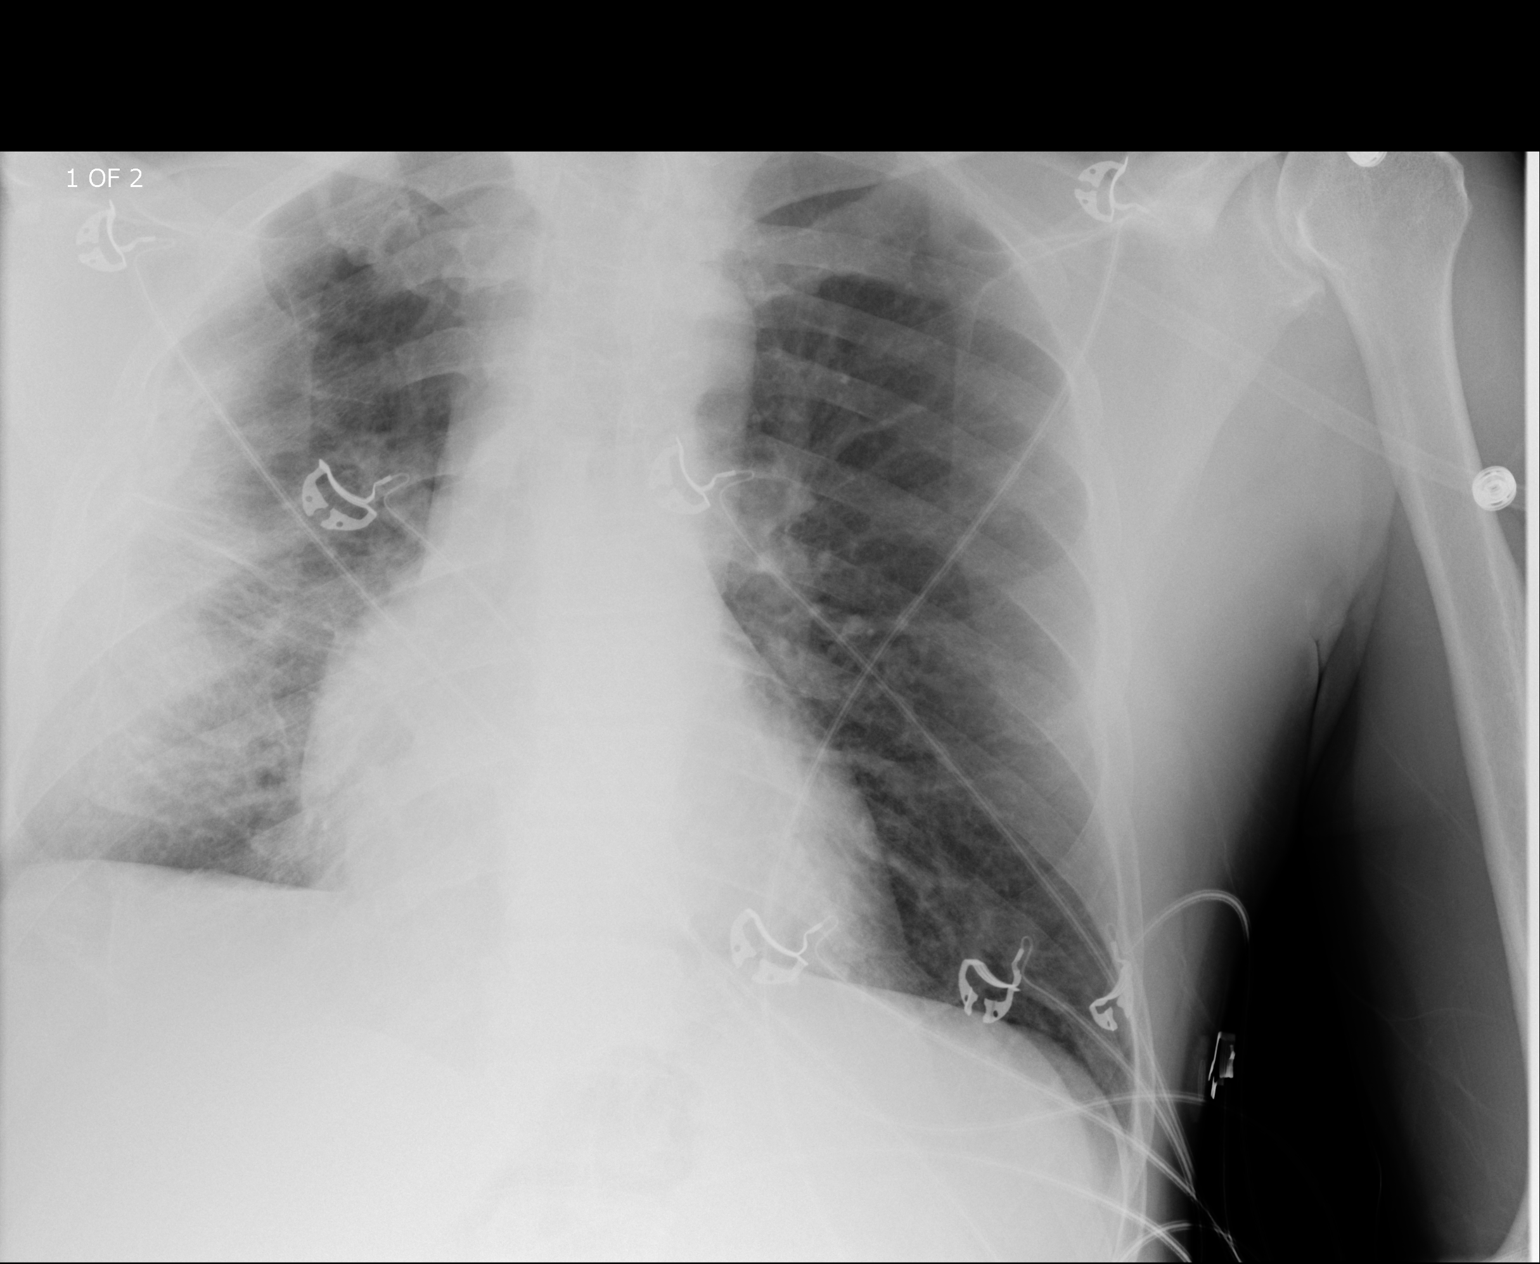

[2 of 2 positions shown; findings below may reference images not displayed]

FINDINGS: There is scarring in the right mid and lower lung zones. There is
asymmetric pleural thickening on the right, also stable. There is no
frank edema or consolidation. Heart size and pulmonary vascularity
normal. No adenopathy. No bone lesions.
IMPRESSION: Scarring in the right mid and lower lung zones, also present on
prior examination. No frank edema or consolidation. Cardiac
silhouette within normal limits.

## 2018-03-08 ENCOUNTER — Ambulatory Visit: Payer: Medicare HMO | Admitting: Cardiovascular Disease

## 2018-03-08 ENCOUNTER — Encounter: Payer: Self-pay | Admitting: Cardiovascular Disease

## 2018-03-08 VITALS — BP 106/66 | HR 61 | Ht 73.0 in | Wt 189.5 lb

## 2018-03-08 DIAGNOSIS — I2510 Atherosclerotic heart disease of native coronary artery without angina pectoris: Secondary | ICD-10-CM

## 2018-03-08 DIAGNOSIS — I255 Ischemic cardiomyopathy: Secondary | ICD-10-CM

## 2018-03-08 DIAGNOSIS — E785 Hyperlipidemia, unspecified: Secondary | ICD-10-CM | POA: Diagnosis not present

## 2018-03-08 DIAGNOSIS — R0989 Other specified symptoms and signs involving the circulatory and respiratory systems: Secondary | ICD-10-CM | POA: Diagnosis not present

## 2018-03-08 NOTE — Patient Instructions (Signed)
Medication Instructions: Continue same medications.   Labwork: None.   Procedures/Testing: None.   Follow-Up: 1 year follow up with Dr. Arida.   Any Additional Special Instructions Will Be Listed Below (If Applicable).     If you need a refill on your cardiac medications before your next appointment, please call your pharmacy.   

## 2018-03-08 NOTE — Progress Notes (Signed)
Cardiology Office Note   Date:  03/08/2018   ID:  Grant Cardenas, DOB 07/27/1956, MRN [id number]  PCP:  Sinda Du, MD  Cardiologist:   Kathlyn Sacramento, MD   Chief Complaint  Patient presents with  . Other    Past due 6 month follow up. Patient c/o SOB. Patient last seen 03/2017. Meds reviewed verbally with patient.       History of Present Illness: Grant Cardenas is a 62 y.o. male who presents for  a follow-up visit regarding coronary artery disease and ischemic cardiomyopathy. He presented in December 2016 with anterior ST elevation myocardial infarction. Cardiac catheterization showed occluded proximal LAD and 60% ostial RCA stenosis. There was also very distal disease affecting the LAD and ramus. He underwent successful angioplasty and drug-eluting stent placement to the LAD. Ejection fraction was 35-40% which improved subsequently to 45%. His other medical problems include psoriasis, Previous tobacco use, COPD and hyperlipidemia.  He has been doing extremely well with no recent chest pain, shortness of breath or palpitations.  He had carotid Doppler done last year for a bruit which showed mild nonobstructive disease bilaterally.  Past Medical History:  Diagnosis Date  . CAD (coronary artery disease)    a. anterior ST elevation MI in 09/2015. Cardiac cath showed occluded prox LAD and 60% ostial RCA stenosis. There was also very distal disease affecting the LAD and ramus. He underwent successful angioplasty and drug-eluting stent placement to the LAD. Ejection fraction was 35-40% which improved subsequently an echo to 55-60%.  Marland Kitchen COPD (chronic obstructive pulmonary disease) ([place]) 03/2016   4th stage-Dr. Luan Pulling  . Hyperlipidemia   . Ischemic cardiomyopathy    a. EF by cath 09/13/2015: 35-40%; b. 09/14/2015: EF 55-60%, Probable akinesis of  the midanteroseptal myocardium. akinesis of the apicalanterior myocardium, GR1DD  . MI (myocardial infarction) ([place])   .  Psoriasis   . Tobacco use   . Vitreous floaters of both eyes     Past Surgical History:  Procedure Laterality Date  . CARDIAC CATHETERIZATION N/A 09/13/2015   Procedure: Left Heart Cath and Coronary Angiography;  Surgeon: Wellington Hampshire, MD;  Location: [place] [facility];  Service: Cardiovascular;  Laterality: N/A;  . CARDIAC CATHETERIZATION N/A 09/13/2015   Procedure: Coronary Stent Intervention;  Surgeon: Wellington Hampshire, MD;  Location: [place] [facility];  Service: Cardiovascular;  Laterality: N/A;  . CARDIAC CATHETERIZATION N/A 11/03/2015   Procedure: Left Heart Cath and Coronary Angiography;  Surgeon: Peter M Martinique, MD;  Location: [place] [facility];  Service: Cardiovascular;  Laterality: N/A;  . COLONOSCOPY N/A 01/29/2016   Procedure: COLONOSCOPY;  Surgeon: Danie Binder, MD;  [facility];  Service: Endoscopy;  Laterality: N/A;  1030-moved to 1215 office to notify   . ESOPHAGOGASTRODUODENOSCOPY N/A 01/29/2016   Procedure: ESOPHAGOGASTRODUODENOSCOPY (EGD);  Surgeon: Danie Binder, MD;  [facility];  Service: Endoscopy;  Laterality: N/A;     Current Outpatient Medications  Medication Sig Dispense Refill  . aspirin EC 81 MG tablet Take 1 tablet (81 mg total) by mouth daily. 90 tablet 3  . atorvastatin (LIPITOR) 40 MG tablet Take 1 tablet (40 mg total) by mouth daily at 6 PM. 30 tablet 6  . carvedilol (COREG) 3.125 MG tablet Take 1 tablet (3.125 mg total) by mouth 2 (two) times daily with a meal. 60 tablet 6  . clobetasol ointment (TEMOVATE) 6.19 % Apply 1 application topically 2 (two) times daily.   1  .  losartan (COZAAR) 100 MG tablet Take 50 mg by mouth daily.    . nitroGLYCERIN (NITROSTAT) 0.4 MG SL tablet Place 1 tablet (0.4 mg total) under the tongue every 5 (five) minutes as needed for chest pain. 25 tablet 12  . umeclidinium-vilanterol (ANORO ELLIPTA) 62.5-25 MCG/INH AEPB Inhale 1 puff into the lungs daily.     No current facility-administered  medications for this visit.     Allergies:   Patient has no known allergies.    Social History:  The patient  reports that he quit smoking about 2 years ago. His smoking use included cigarettes. He quit after 40.00 years of use. He has never used smokeless tobacco. He reports that he does not drink alcohol or use drugs.   Family History:  The patient's family history includes Cancer in his other; Colon cancer in his maternal aunt; Heart Problems in his mother; Heart attack in his father; Heart disease in his father.      PHYSICAL EXAM: VS:  BP 106/66 (BP Location: Right Arm, Patient Position: Sitting, Cuff Size: Normal)   Pulse 61   Ht 6\' 1"  (1.854 m)   Wt 189 lb 8 oz (86 kg)   BMI 25.00 kg/m  , BMI Body mass index is 25 kg/m. GEN: Well nourished, well developed, in no acute distress  HEENT: normal  Neck: no JVD or masses. Right carotid bruit Cardiac: RRR; no murmurs, rubs, or gallops,no edema  Respiratory:  clear to auscultation bilaterally, normal work of breathing GI: soft, nontender, nondistended, + BS MS: no deformity or atrophy  Skin: warm and dry, no rash Neuro:  Strength and sensation are intact Psych: euthymic mood, full affect   EKG:  EKG  ordered today. EKG showed sinus rhythm with sinus arrhythmia and old septal infarct.   Recent Labs: No results found for requested labs within last 8760 hours.    Lipid Panel    Component Value Date/Time   CHOL 153 11/26/2015 0815   CHOL 212 (H) 09/23/2015 1540   TRIG 85 11/26/2015 0815   HDL 41 11/26/2015 0815   HDL 41 09/23/2015 1540   CHOLHDL 3.7 11/26/2015 0815   VLDL 17 11/26/2015 0815   LDLCALC 95 11/26/2015 0815   LDLCALC 137 (H) 09/23/2015 1540      Wt Readings from Last 3 Encounters:  03/08/18 189 lb 8 oz (86 kg)  03/16/17 179 lb 8 oz (81.4 kg)  09/08/16 198 lb 4 oz (89.9 kg)        ASSESSMENT AND PLAN:  1.  Coronary artery disease involving native coronary arteries of native heart without angina  :  He is doing well overall. Continue aspirin indefinitely.  2.  Ischemic cardiomyopathy:  Most recent ejection fraction was 45%.  Continue small dose carvedilol and losartan.  3. Hyperlipidemia :  Continue treatment with atorvastatin 40 mg daily. This is being monitored by his primary care physician.  4. Right carotid bruit: Carotid Doppler last year showed mild nonobstructive disease.  Recommend treatment of risk factors.   Disposition:   FU with me in 12 months  Signed,  Kathlyn Sacramento, MD  03/08/2018 10:47 AM    [place]

## 2018-08-06 ENCOUNTER — Ambulatory Visit (HOSPITAL_COMMUNITY)
Admission: RE | Admit: 2018-08-06 | Discharge: 2018-08-06 | Disposition: A | Discharge status: Home / Self Care | Payer: Medicare HMO | Source: Ambulatory Visit | Attending: Dermatology | Admitting: Dermatology

## 2018-08-06 ENCOUNTER — Other Ambulatory Visit (HOSPITAL_COMMUNITY): Payer: Self-pay | Admitting: Dermatology

## 2018-08-06 DIAGNOSIS — A157 Primary respiratory tuberculosis: Secondary | ICD-10-CM

## 2018-11-08 LAB — CBC: RBC: 5.58 — AB (ref 3.87–5.11)

## 2018-11-08 LAB — HEPATIC FUNCTION PANEL
ALT: 113 — AB (ref 10–40)
AST: 59 — AB (ref 14–40)
Alkaline Phosphatase: 80 (ref 25–125)

## 2018-11-08 LAB — CBC AND DIFFERENTIAL
HCT: 49 (ref 41–53)
Hemoglobin: 16.4 (ref 13.5–17.5)
Neutrophils Absolute: 1664
Platelets: 207 (ref 150–399)
WBC: 4

## 2018-11-08 LAB — BASIC METABOLIC PANEL
BUN: 14 (ref 4–21)
CO2: 22 (ref 13–22)
Chloride: 108 (ref 99–108)
Creatinine: 1.1 (ref <=1.3)
Glucose: 126
Potassium: 4.6 (ref 3.4–5.3)
Sodium: 140 (ref 137–147)

## 2018-11-08 LAB — HEMOGLOBIN A1C: Hemoglobin A1C: 6.6

## 2018-11-08 LAB — COMPREHENSIVE METABOLIC PANEL
Albumin: 4.7 (ref 3.5–5.0)
Calcium: 9.7 (ref 8.7–10.7)
GFR calc Af Amer: 82
GFR calc non Af Amer: 71
Globulin: 2.5

## 2018-11-08 LAB — TSH: TSH: 2.61 (ref <=5.90)

## 2018-11-14 LAB — LIPID PANEL
Cholesterol: 151 (ref 0–200)
HDL: 43 (ref 35–70)
LDL Cholesterol: 90
Triglycerides: 89 (ref 40–160)

## 2019-01-10 ENCOUNTER — Ambulatory Visit: Payer: Medicare HMO | Admitting: Gastroenterology

## 2019-02-06 ENCOUNTER — Encounter: Payer: Self-pay | Admitting: Gastroenterology

## 2019-04-30 ENCOUNTER — Other Ambulatory Visit (HOSPITAL_COMMUNITY): Payer: Self-pay | Admitting: Pulmonary Disease

## 2019-04-30 ENCOUNTER — Ambulatory Visit (HOSPITAL_COMMUNITY)
Admission: RE | Admit: 2019-04-30 | Discharge: 2019-04-30 | Disposition: A | Discharge status: Home / Self Care | Payer: Medicare HMO | Source: Ambulatory Visit | Attending: Pulmonary Disease | Admitting: Pulmonary Disease

## 2019-04-30 ENCOUNTER — Other Ambulatory Visit: Payer: Self-pay

## 2019-04-30 DIAGNOSIS — M25551 Pain in right hip: Secondary | ICD-10-CM

## 2019-04-30 DIAGNOSIS — M25552 Pain in left hip: Secondary | ICD-10-CM

## 2019-06-06 ENCOUNTER — Encounter: Payer: Self-pay | Admitting: Cardiovascular Disease

## 2019-06-06 ENCOUNTER — Other Ambulatory Visit: Payer: Self-pay

## 2019-06-06 ENCOUNTER — Ambulatory Visit: Payer: Medicare HMO | Admitting: Cardiovascular Disease

## 2019-06-06 VITALS — BP 98/60 | HR 56 | Ht 73.0 in | Wt 185.5 lb

## 2019-06-06 DIAGNOSIS — I25119 Atherosclerotic heart disease of native coronary artery with unspecified angina pectoris: Secondary | ICD-10-CM | POA: Diagnosis not present

## 2019-06-06 DIAGNOSIS — E785 Hyperlipidemia, unspecified: Secondary | ICD-10-CM

## 2019-06-06 DIAGNOSIS — I255 Ischemic cardiomyopathy: Secondary | ICD-10-CM

## 2019-06-06 NOTE — Patient Instructions (Signed)
Medication Instructions:  Your physician recommends that you continue on your current medications as directed. Please refer to the Current Medication list given to you today.  If you need a refill on your cardiac medications before your next appointment, please call your pharmacy.   Lab work: None ordered If you have labs (blood work) drawn today and your tests are completely normal, you will receive your results only by: . MyChart Message (if you have MyChart) OR . A paper copy in the mail If you have any lab test that is abnormal or we need to change your treatment, we will call you to review the results.  Testing/Procedures: None ordered  Follow-Up: At [facility], you and your health needs are our priority.  As part of our continuing mission to provide you with exceptional heart care, we have created designated Provider Care Teams.  These Care Teams include your primary Cardiologist (physician) and Advanced Practice Providers (APPs -  Physician Assistants and Nurse Practitioners) who all work together to provide you with the care you need, when you need it. You will need a follow up appointment in 12 months.  Please call our office 2 months in advance to schedule this appointment.  You may see  Dr. Arida or one of the following Advanced Practice Providers on your designated Care Team:   Christopher Berge, NP Ryan Dunn, PA-C . Jacquelyn Visser, PA-C  Any Other Special Instructions Will Be Listed Below (If Applicable). N/A   

## 2019-06-06 NOTE — Progress Notes (Signed)
Cardiology Office Note   Date:  06/06/2019   ID:  Grant Cardenas, DOB December 15, 1955, MRN [id number]  PCP:  Sinda Du, MD  Cardiologist:   Kathlyn Sacramento, MD   Chief Complaint  Patient presents with  . other    12 month f/u no complaints today. Meds reviewed verbally with pt.      History of Present Illness: Grant Cardenas is a 63 y.o. male who presents for  a follow-up visit regarding coronary artery disease and ischemic cardiomyopathy. He presented in December 2016 with anterior ST elevation myocardial infarction. Cardiac catheterization showed occluded proximal LAD and 60% ostial RCA stenosis. There was also very distal disease affecting the LAD and ramus. He underwent successful angioplasty and drug-eluting stent placement to the LAD. Ejection fraction was 35-40% which improved subsequently to 45%. His other medical problems include psoriasis, Previous tobacco use, COPD and hyperlipidemia.  He is known to have right carotid bruit but previous carotid Doppler showed no obstructive disease.  He has been doing very well with no recent chest pain, shortness of breath or palpitations.  He takes his medications regularly.  Blood pressure is soft today but he denies any dizziness or syncope.  Past Medical History:  Diagnosis Date  . CAD (coronary artery disease)    a. anterior ST elevation MI in 09/2015. Cardiac cath showed occluded prox LAD and 60% ostial RCA stenosis. There was also very distal disease affecting the LAD and ramus. He underwent successful angioplasty and drug-eluting stent placement to the LAD. Ejection fraction was 35-40% which improved subsequently an echo to 55-60%.  Marland Kitchen COPD (chronic obstructive pulmonary disease) ([place]) 03/2016   4th stage-Dr. Luan Pulling  . Hyperlipidemia   . Ischemic cardiomyopathy    a. EF by cath 09/13/2015: 35-40%; b. 09/14/2015: EF 55-60%, Probable akinesis of  the midanteroseptal myocardium. akinesis of the apicalanterior myocardium,  GR1DD  . MI (myocardial infarction) ([place])   . Psoriasis   . Tobacco use   . Vitreous floaters of both eyes     Past Surgical History:  Procedure Laterality Date  . CARDIAC CATHETERIZATION N/A 09/13/2015   Procedure: Left Heart Cath and Coronary Angiography;  Surgeon: Wellington Hampshire, MD;  Location: [place] [facility];  Service: Cardiovascular;  Laterality: N/A;  . CARDIAC CATHETERIZATION N/A 09/13/2015   Procedure: Coronary Stent Intervention;  Surgeon: Wellington Hampshire, MD;  Location: [place] [facility];  Service: Cardiovascular;  Laterality: N/A;  . CARDIAC CATHETERIZATION N/A 11/03/2015   Procedure: Left Heart Cath and Coronary Angiography;  Surgeon: Peter M Martinique, MD;  Location: [place] [facility];  Service: Cardiovascular;  Laterality: N/A;  . COLONOSCOPY N/A 01/29/2016   Procedure: COLONOSCOPY;  Surgeon: Danie Binder, MD;  [facility];  Service: Endoscopy;  Laterality: N/A;  1030-moved to 1215 office to notify   . ESOPHAGOGASTRODUODENOSCOPY N/A 01/29/2016   Procedure: ESOPHAGOGASTRODUODENOSCOPY (EGD);  Surgeon: Danie Binder, MD;  [facility];  Service: Endoscopy;  Laterality: N/A;     Current Outpatient Medications  Medication Sig Dispense Refill  . aspirin EC 81 MG tablet Take 1 tablet (81 mg total) by mouth daily. 90 tablet 3  . atorvastatin (LIPITOR) 40 MG tablet Take 1 tablet (40 mg total) by mouth daily at 6 PM. 30 tablet 6  . carvedilol (COREG) 3.125 MG tablet Take 1 tablet (3.125 mg total) by mouth 2 (two) times daily with a meal. 60 tablet 6  . clobetasol ointment (TEMOVATE) 0.05 %  Apply 1 application topically 2 (two) times daily.   1  . fluticasone (CUTIVATE) 0.05 % cream 2 (two) times daily.    Marland Kitchen ketoconazole (NIZORAL) 2 % shampoo Apply 1 application topically 2 (two) times a week.    . losartan (COZAAR) 100 MG tablet Take 50 mg by mouth 2 (two) times daily.     . nitroGLYCERIN (NITROSTAT) 0.4 MG SL tablet Place 1 tablet (0.4 mg total) under  the tongue every 5 (five) minutes as needed for chest pain. 25 tablet 12  . umeclidinium-vilanterol (ANORO ELLIPTA) 62.5-25 MCG/INH AEPB Inhale 1 puff into the lungs daily.    Marland Kitchen Ustekinumab (STELARA) 45 MG/0.5ML SOLN Inject into the skin. Every ;12 wks     No current facility-administered medications for this visit.     Allergies:   Patient has no known allergies.    Social History:  The patient  reports that he quit smoking about 3 years ago. His smoking use included cigarettes. He quit after 40.00 years of use. He has never used smokeless tobacco. He reports that he does not drink alcohol or use drugs.   Family History:  The patient's family history includes Cancer in an other family member; Colon cancer in his maternal aunt; Heart Problems in his mother; Heart attack in his father; Heart disease in his father.      PHYSICAL EXAM: VS:  BP 98/60 (BP Location: Left Arm, Patient Position: Sitting, Cuff Size: Normal)   Pulse (!) 56   Ht 6\' 1"  (1.854 m)   Wt 185 lb 8 oz (84.1 kg)   SpO2 98%   BMI 24.47 kg/m  , BMI Body mass index is 24.47 kg/m. GEN: Well nourished, well developed, in no acute distress  HEENT: normal  Neck: no JVD or masses. Right carotid bruit Cardiac: RRR; no murmurs, rubs, or gallops,no edema  Respiratory:  clear to auscultation bilaterally, normal work of breathing GI: soft, nontender, nondistended, + BS MS: no deformity or atrophy  Skin: warm and dry, no rash Neuro:  Strength and sensation are intact Psych: euthymic mood, full affect   EKG:  EKG  ordered today. EKG showed sinus bradycardia with nonspecific T wave changes.   Recent Labs: No results found for requested labs within last 8760 hours.    Lipid Panel    Component Value Date/Time   CHOL 153 11/26/2015 0815   CHOL 212 (H) 09/23/2015 1540   TRIG 85 11/26/2015 0815   HDL 41 11/26/2015 0815   HDL 41 09/23/2015 1540   CHOLHDL 3.7 11/26/2015 0815   VLDL 17 11/26/2015 0815   LDLCALC 95  11/26/2015 0815   LDLCALC 137 (H) 09/23/2015 1540      Wt Readings from Last 3 Encounters:  06/06/19 185 lb 8 oz (84.1 kg)  03/08/18 189 lb 8 oz (86 kg)  03/16/17 179 lb 8 oz (81.4 kg)        ASSESSMENT AND PLAN:  1.  Coronary artery disease involving native coronary arteries of native heart without angina :  He is doing well overall. Continue aspirin indefinitely.  2.  Ischemic cardiomyopathy:  Most recent ejection fraction was 45%.  Continue small dose carvedilol and losartan.  No evidence of volume overload.  3. Hyperlipidemia :  Continue treatment with atorvastatin 40 mg daily. This is being monitored by his primary care physician.  Recommend a target LDL of less than 70.  4. Right carotid bruit: Carotid Doppler in 2018 showed mild nonobstructive disease.  Disposition:   FU with me in 12 months  Signed,  Kathlyn Sacramento, MD  06/06/2019 9:51 AM    [place]

## 2019-08-30 DIAGNOSIS — L409 Psoriasis, unspecified: Secondary | ICD-10-CM | POA: Insufficient documentation

## 2019-08-30 DIAGNOSIS — I10 Essential (primary) hypertension: Secondary | ICD-10-CM | POA: Insufficient documentation

## 2019-08-30 DIAGNOSIS — J449 Chronic obstructive pulmonary disease, unspecified: Secondary | ICD-10-CM | POA: Insufficient documentation

## 2019-09-11 ENCOUNTER — Encounter: Payer: Self-pay | Admitting: *Deleted

## 2019-10-28 ENCOUNTER — Ambulatory Visit: Payer: Medicare HMO

## 2019-11-13 ENCOUNTER — Ambulatory Visit: Payer: Medicare HMO | Admitting: Family Medicine

## 2019-12-05 ENCOUNTER — Ambulatory Visit: Payer: Medicare HMO

## 2019-12-17 ENCOUNTER — Ambulatory Visit: Payer: Medicare HMO | Admitting: Family Medicine

## 2020-01-14 ENCOUNTER — Ambulatory Visit (INDEPENDENT_AMBULATORY_CARE_PROVIDER_SITE_OTHER): Payer: Self-pay | Admitting: *Deleted

## 2020-01-14 ENCOUNTER — Other Ambulatory Visit: Payer: Self-pay

## 2020-01-14 DIAGNOSIS — Z86010 Personal history of colonic polyps: Secondary | ICD-10-CM

## 2020-01-14 MED ORDER — NA SULFATE-K SULFATE-MG SULF 17.5-3.13-1.6 GM/177ML PO SOLN: 1.0000 | Freq: Once | ORAL | 0 refills | Status: AC

## 2020-01-14 NOTE — Progress Notes (Signed)
Pt lost prep kit that [place] sent to him so he is requesting that I [facility].

## 2020-01-14 NOTE — Patient Instructions (Addendum)
SPLIT MOVIPREP INSTRUCTION SHEET  Please notify us immediately if you are diabetic, take iron supplements, or if you are on coumadin or any blood thinners.  Patient Name:  Grant Cardenas Date of procedure:  02/28/2020 Time to register at [facility]: 8:30 am Provider:  Dr. Lazarus Gowda will need to purchase 1 fleet enema   Please hold the following medications: n/a  02/27/2020-  1 Day prior to procedure:     CLEAR LIQUIDS ALL DAY--NO SOLID FOODS!  Diabetic Medication Instructions:  n/a   At 5:00 PM Begin the prep as follows:     Empty 1 pouch A and 1 pouch B into disposable container.  Add lukewarm drinking water to the top line of the container.  Mix to dissolve.  You can mix solution ahead of time & refrigerate prior to drinking.  The solution should be used within 24 hours.  The container is divided by 4 marks.  Every 15 minutes, drink the solution down to the next mark (approx 8 oz) until the liter is complete.  Be sure to drink 16 ounces of clear liquid of your choice (this is important to make sure you stay hydrated and the prep works)   [place]  ([place]). You may take heart, blood pressure, or breathing medications  02/28/2020-  Day of Procedure    Diabetic medications adjustments for today: n/a   Five hours before your procedure at 4:30 am complete prep as follows:  Empty 1 pouch A and 1 pouch B into disposable container.  Add lukewarm drinking water to the top line of the container.  Mix to dissolve.  You can mix solution ahead of time & refrigerate prior to drinking.  The solution should be used within 24 hours.  The container is divided by 4 marks.  Every 15 minutes, drink the solution down to the next mark (approx 8 oz) until the liter is complete.  You must complete the entire prep to ensure the most effective cleaning.  Be sure to drink 16 ounces of clear liquid of your choice (this is important to make  sure you stay hydrated and the prep works)  Should complete within 1 and 1/2 hours.   NOTHING TO EAT OR DRINK AFTER 6:30 AM (3 hours before your procedure)  Give yourself one Fleet enema about 1 hour prior to leaving for the hospital.  You may take TYLENOL products.  Please continue your regular medications unless we have instructed you otherwise.     Please note, on the day of your procedure you MUST be accompanied by an adult who is willing to assume responsibility for you at time of discharge. If you do not have such person with you, your procedure will have to be rescheduled.                                                                                                                     Please leave  ALL jewelry at home prior to coming to the hospital for your procedure.   *It is your responsibility to check with your insurance company for the benefits of coverage you have for this procedure. Unfortunately, not all insurance companies have benefits to cover all or part of these types of procedures. It is your responsibility to check your benefits, however we will be glad to assist you with any codes your insurance company may need.   Please note that most insurance companies will not cover a screening colonoscopy for people under the age of 36  For example, with some insurance companies you may have benefits for a screening colonoscopy, but if polyps are found the diagnosis will change and then you may have a deductible that will need to be met. Please make sure you check your benefits for screening colonoscopy as well as a diagnostic colonoscopy.   CLEAR LIQUIDS: (NO RED) Jello Apple Juice  White Grape Juice Water Banana popsicles  Kool-Aid  Coffee(No cream or milk) Tea (No cream or milk) Soft drinks Broth (fat free beef/chicken/vegetable)  Clear liquids allow you to see your fingers on the other side of the glass.  Be sure they are NOT RED in color, cloudy, but CLEAR.  Do Not  Eat: Dairy products of any kind Cranberry juice Tomato or V8 Juice  Orange Juice Grapefruit Juice  Red Grape Juice Solid foods like cereal, oatmeal, yogurt, fruits, vegetables, creamed soups, eggs, bread, etc   HELPFUL HINTS TO MAKE DRINKING EASIER: -Make sure prep is extremely COLD.  Refrigerate the night before.  You may also put in freezer. -You may try mixing Crystal Light or Country Time Lemonade if you prefer.  MIx in small amounts.  Add more if necessary. -Trying drinking through a straw. -Rinse mouth with water or mouthwash between glasses to remove aftertaste. -Try sipping on a cold beverage/ice popsicles between glasses of prep. -Place a piece of sugar-free hard candy in mouth between glasses. -If you become nauseated, try consuming smaller amounts or stretch out the time between glasses.  Stop for 30 minutes to an hour & slowly start back drinking.  Call our office with any questions or concerns at [phone].  Thank You,  Grant Cardenas, [place]

## 2020-01-14 NOTE — Progress Notes (Addendum)
Gastroenterology Pre-Procedure Review  Request Date: 01/14/2020 Requesting Physician: [facility], Last TCS 01/29/2016 done by Dr. Oneida Alar, tubular adenoma  PATIENT REVIEW QUESTIONS: The patient responded to the following health history questions as indicated:    1. 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 Joint replacements in the past 12 months: no 3. Major health problems in the past 3 months: no 4. Has an artificial valve or MVP: no 5. Has a defibrillator: no 6. Has been advised in past to take antibiotics in advance of a procedure like teeth cleaning: no 7. Family history of colon cancer: no  8. Alcohol Use: no 9. Illicit drug Use: no 10. History of sleep apnea: no  11. History of coronary artery or other vascular stents placed within the last 12 months: no 12. History of any prior anesthesia complications: no 13. There is no height or weight on file to calculate BMI. ht: 6'0 wt: 193 lbs    MEDICATIONS & ALLERGIES:    Patient reports the following regarding taking any blood thinners:   Plavix? no Aspirin? yes Coumadin? no Brilinta? no Xarelto? no Eliquis? no Pradaxa? no Savaysa? no Effient? no  Patient confirms/reports the following medications:  Current Outpatient Medications  Medication Sig Dispense Refill  . Albuterol Sulfate 108 (90 Base) MCG/ACT AEPB Inhale into the lungs as needed.    Marland Kitchen aspirin EC 81 MG tablet Take 1 tablet (81 mg total) by mouth daily. 90 tablet 3  . atorvastatin (LIPITOR) 40 MG tablet Take 1 tablet (40 mg total) by mouth daily at 6 PM. 30 tablet 6  . Carboxymethylcellulose Sod PF 0.25 % SOLN Apply to eye 4 (four) times daily as needed. Drops into both eyes daily.    . carvedilol (COREG) 3.125 MG tablet Take 1 tablet (3.125 mg total) by mouth 2 (two) times daily with a meal. 60 tablet 6  . clobetasol ointment (TEMOVATE) [id number] % Apply 1 application topically 2 (two) times daily.   1  . fluticasone (CUTIVATE) 0.05 % cream 2 (two) times daily.     Marland Kitchen ketoconazole (NIZORAL) 2 % shampoo Apply 1 application topically 2 (two) times a week.    . losartan (COZAAR) 100 MG tablet Take 50 mg by mouth daily.     . nitroGLYCERIN (NITROSTAT) 0.4 MG SL tablet Place 1 tablet (0.4 mg total) under the tongue every 5 (five) minutes as needed for chest pain. 25 tablet 12  . umeclidinium-vilanterol (ANORO ELLIPTA) 62.5-25 MCG/INH AEPB Inhale 1 puff into the lungs daily.    Marland Kitchen Ustekinumab (STELARA) 45 MG/0.5ML SOLN Inject into the skin. Every ;12 wks     No current facility-administered medications for this visit.    Patient confirms/reports the following allergies:  Allergies  Allergen Reactions  . Sulfa Antibiotics     Sulfa drugs     No orders of the defined types were placed in this encounter.   AUTHORIZATION INFORMATION Primary Insurance: [facility],  ID #: [id number],  Group #: [id number] Pre-Cert / Auth required: No, not required  SCHEDULE INFORMATION: Procedure has been scheduled as follows:  Date: 02/28/2020, Time: 9:30 Location: [facility] with Dr. Gala Romney  This Gastroenterology Pre-Precedure Review Form is being routed to the following provider(s): Roseanne Kaufman, NP

## 2020-01-16 ENCOUNTER — Telehealth: Payer: Self-pay | Admitting: Gastroenterology

## 2020-01-16 NOTE — Telephone Encounter (Signed)
Grant Cardenas (wife) called back and informed me that she located the prep kit that the [place] shipped to them when the referral to [place] was made.  She said that it was Moviprep.  Informed her that we would send prep instructions to her once triage is reviewed and if approved by provider.  She voiced understanding.

## 2020-01-16 NOTE — Telephone Encounter (Signed)
Patient wife called and said his prep was too high, please send in an alternative.

## 2020-01-16 NOTE — Telephone Encounter (Signed)
[facility] and [facility] are [facility] Trilyte.  Called pt's wife back and informed her.  I told her that we could do a Miralax prep that includes all OTC items.  She told me that she will get back with me.  She wants to see if she can locate the lost prep kit that the [place] originally shipped to them.

## 2020-01-20 NOTE — Progress Notes (Signed)
Appropriate.

## 2020-02-26 ENCOUNTER — Other Ambulatory Visit: Payer: Self-pay

## 2020-02-26 ENCOUNTER — Other Ambulatory Visit (HOSPITAL_COMMUNITY)
Admission: RE | Admit: 2020-02-26 | Discharge: 2020-02-26 | Disposition: A | Discharge status: Home / Self Care | Payer: Medicare HMO | Source: Ambulatory Visit | Attending: Internal Medicine | Admitting: Internal Medicine

## 2020-02-26 DIAGNOSIS — Z01812 Encounter for preprocedural laboratory examination: Secondary | ICD-10-CM | POA: Diagnosis present

## 2020-02-26 DIAGNOSIS — Z20822 Contact with and (suspected) exposure to covid-19: Secondary | ICD-10-CM | POA: Insufficient documentation

## 2020-02-26 LAB — SARS CORONAVIRUS 2 (TAT 6-24 HRS): SARS Coronavirus 2: NEGATIVE

## 2020-02-28 ENCOUNTER — Encounter (HOSPITAL_COMMUNITY)
Admission: RE | Disposition: A | Discharge status: Home / Self Care | Payer: Self-pay | Source: Home / Self Care | Attending: Internal Medicine

## 2020-02-28 ENCOUNTER — Ambulatory Visit (HOSPITAL_COMMUNITY)
Admission: RE | Admit: 2020-02-28 | Discharge: 2020-02-28 | Disposition: A | Discharge status: Home / Self Care | Payer: Medicare HMO | Attending: Internal Medicine | Admitting: Internal Medicine

## 2020-02-28 ENCOUNTER — Other Ambulatory Visit: Payer: Self-pay

## 2020-02-28 DIAGNOSIS — Z87891 Personal history of nicotine dependence: Secondary | ICD-10-CM | POA: Insufficient documentation

## 2020-02-28 DIAGNOSIS — Z79899 Other long term (current) drug therapy: Secondary | ICD-10-CM | POA: Diagnosis not present

## 2020-02-28 DIAGNOSIS — Z882 Allergy status to sulfonamides status: Secondary | ICD-10-CM | POA: Diagnosis not present

## 2020-02-28 DIAGNOSIS — I10 Essential (primary) hypertension: Secondary | ICD-10-CM | POA: Diagnosis not present

## 2020-02-28 DIAGNOSIS — Z8249 Family history of ischemic heart disease and other diseases of the circulatory system: Secondary | ICD-10-CM | POA: Diagnosis not present

## 2020-02-28 DIAGNOSIS — Z86010 Personal history of colonic polyps: Secondary | ICD-10-CM | POA: Diagnosis not present

## 2020-02-28 DIAGNOSIS — Z955 Presence of coronary angioplasty implant and graft: Secondary | ICD-10-CM | POA: Diagnosis not present

## 2020-02-28 DIAGNOSIS — Z1211 Encounter for screening for malignant neoplasm of colon: Secondary | ICD-10-CM | POA: Diagnosis not present

## 2020-02-28 DIAGNOSIS — I252 Old myocardial infarction: Secondary | ICD-10-CM | POA: Diagnosis not present

## 2020-02-28 DIAGNOSIS — D125 Benign neoplasm of sigmoid colon: Secondary | ICD-10-CM | POA: Diagnosis not present

## 2020-02-28 DIAGNOSIS — I2510 Atherosclerotic heart disease of native coronary artery without angina pectoris: Secondary | ICD-10-CM | POA: Insufficient documentation

## 2020-02-28 DIAGNOSIS — K635 Polyp of colon: Secondary | ICD-10-CM | POA: Diagnosis not present

## 2020-02-28 DIAGNOSIS — D124 Benign neoplasm of descending colon: Secondary | ICD-10-CM | POA: Insufficient documentation

## 2020-02-28 DIAGNOSIS — Z800 Family history of malignant neoplasm of digestive organs: Secondary | ICD-10-CM | POA: Diagnosis not present

## 2020-02-28 DIAGNOSIS — Z7982 Long term (current) use of aspirin: Secondary | ICD-10-CM | POA: Insufficient documentation

## 2020-02-28 DIAGNOSIS — J449 Chronic obstructive pulmonary disease, unspecified: Secondary | ICD-10-CM | POA: Insufficient documentation

## 2020-02-28 DIAGNOSIS — I255 Ischemic cardiomyopathy: Secondary | ICD-10-CM | POA: Insufficient documentation

## 2020-02-28 DIAGNOSIS — E785 Hyperlipidemia, unspecified: Secondary | ICD-10-CM | POA: Diagnosis not present

## 2020-02-28 HISTORY — PX: COLONOSCOPY: SHX5424

## 2020-02-28 HISTORY — PX: POLYPECTOMY: SHX5525

## 2020-02-28 SURGERY — COLONOSCOPY
Anesthesia: Moderate Sedation

## 2020-02-28 MED ORDER — MIDAZOLAM HCL 5 MG/5ML IJ SOLN
INTRAMUSCULAR | Status: DC | PRN
  Administered 2020-02-28 (×2): 2 mg via INTRAVENOUS

## 2020-02-28 MED ORDER — MIDAZOLAM HCL 5 MG/5ML IJ SOLN
INTRAMUSCULAR | Status: AC
  Filled 2020-02-28: qty 10

## 2020-02-28 MED ORDER — ONDANSETRON HCL 4 MG/2ML IJ SOLN
INTRAMUSCULAR | Status: AC
  Filled 2020-02-28: qty 2

## 2020-02-28 MED ORDER — MEPERIDINE HCL 100 MG/ML IJ SOLN
INTRAMUSCULAR | Status: DC | PRN
  Administered 2020-02-28: 15 mg via INTRAVENOUS
  Administered 2020-02-28: 25 mg via INTRAVENOUS

## 2020-02-28 MED ORDER — STERILE WATER FOR IRRIGATION IR SOLN
Status: DC | PRN
  Administered 2020-02-28: 1.5 mL

## 2020-02-28 MED ORDER — SODIUM CHLORIDE 0.9 % IV SOLN: INTRAVENOUS | Status: DC

## 2020-02-28 MED ORDER — ONDANSETRON HCL 4 MG/2ML IJ SOLN
INTRAMUSCULAR | Status: DC | PRN
  Administered 2020-02-28: 4 mg via INTRAVENOUS

## 2020-02-28 MED ORDER — MEPERIDINE HCL 50 MG/ML IJ SOLN
INTRAMUSCULAR | Status: AC
  Filled 2020-02-28: qty 1

## 2020-02-28 NOTE — H&P (Signed)
_0 @   Primary Care Physician:  [facility] Primary Gastroenterologist:  Dr. Gala Romney  Pre-Procedure History & Physical: HPI:  Grant Cardenas is a 64 y.o. male here for surveillance colonoscopy. He has a history of multiple colonic polyps removed from his colon in 2017.  Past Medical History:  Diagnosis Date  . CAD (coronary artery disease)    a. anterior ST elevation MI in 09/2015. Cardiac cath showed occluded prox LAD and 60% ostial RCA stenosis. There was also very distal disease affecting the LAD and ramus. He underwent successful angioplasty and drug-eluting stent placement to the LAD. Ejection fraction was 35-40% which improved subsequently an echo to 55-60%.  . Chronic obstructive pulmonary disease, unspecified ([place])   . COPD (chronic obstructive pulmonary disease) ([place]) 03/2016   4th stage-Dr. Luan Pulling  . Essential (primary) hypertension   . Hyperlipidemia   . Ischemic cardiomyopathy    a. EF by cath 09/13/2015: 35-40%; b. 09/14/2015: EF 55-60%, Probable akinesis of  the midanteroseptal myocardium. akinesis of the apicalanterior myocardium, GR1DD  . MI (myocardial infarction) ([place])   . Psoriasis   . Psoriasis, unspecified   . Tobacco use   . Vitreous floaters of both eyes     Past Surgical History:  Procedure Laterality Date  . CARDIAC CATHETERIZATION N/A 09/13/2015   Procedure: Left Heart Cath and Coronary Angiography;  Surgeon: Wellington Hampshire, MD;  Location: [facility];  Service: Cardiovascular;  Laterality: N/A;  . CARDIAC CATHETERIZATION N/A 09/13/2015   Procedure: Coronary Stent Intervention;  Surgeon: Wellington Hampshire, MD;  Location: [place] [facility];  Service: Cardiovascular;  Laterality: N/A;  . CARDIAC CATHETERIZATION N/A 11/03/2015   Procedure: Left Heart Cath and Coronary Angiography;  Surgeon: Peter M Martinique, MD;  Location: [place] [facility];  Service: Cardiovascular;  Laterality: N/A;  . COLONOSCOPY N/A 01/29/2016    Procedure: COLONOSCOPY;  Surgeon: Danie Binder, MD;  [facility];  Service: Endoscopy;  Laterality: N/A;  1030-moved to 1215 office to notify   . ESOPHAGOGASTRODUODENOSCOPY N/A 01/29/2016   Procedure: ESOPHAGOGASTRODUODENOSCOPY (EGD);  Surgeon: Danie Binder, MD;  Location: [facility];  Service: Endoscopy;  Laterality: N/A;    Prior to Admission medications   Medication Sig Start Date End Date Taking? Authorizing Provider  Albuterol Sulfate 108 (90 Base) MCG/ACT AEPB Inhale 1-2 puffs into the lungs 4 (four) times daily as needed (wheezing/shortness of breath.).    Yes [provider]  aspirin EC 81 MG tablet Take 1 tablet (81 mg total) by mouth daily. 09/23/15  Yes Wellington Hampshire, MD  atorvastatin (LIPITOR) 80 MG tablet Take 40 mg by mouth daily with supper.  01/10/20  Yes [provider]  Carboxymethylcellulose Sod PF 0.25 % SOLN Place 1 drop into both eyes 4 (four) times daily as needed (dry/irritated eyes.).    Yes [provider]  carvedilol (COREG) 3.125 MG tablet Take 1 tablet (3.125 mg total) by mouth 2 (two) times daily with a meal. 09/08/16  Yes Wellington Hampshire, MD  clobetasol ointment (TEMOVATE) 4.09 % Apply 1 application topically 2 (two) times daily as needed (psoriasis).  10/25/15  Yes [provider]  fluticasone (CUTIVATE) 0.05 % cream Apply 1 application topically 2 (two) times daily as needed (psoriasis (face & groin areas)).  05/07/19  Yes [provider]  ketoconazole (NIZORAL) 2 % shampoo Apply 1 application topically 2 (two) times a week. Applied to facial hair (beard) & scalp. Leave  on 10 minutes prior to washing out.   Yes [provider]  losartan (COZAAR) 100 MG tablet Take 50 mg by mouth daily.    Yes [provider]  nitroGLYCERIN (NITROSTAT) 0.4 MG SL tablet Place 1 tablet (0.4 mg total) under the tongue every 5 (five) minutes as needed for chest pain. 09/16/15  Yes Bhagat, Bhavinkumar, PA   SUPREP BOWEL PREP KIT 17.5-3.13-1.6 GM/177ML SOLN Take 354 mLs by mouth once. 01/14/20  Yes [provider]  umeclidinium-vilanterol (ANORO ELLIPTA) 62.5-25 MCG/INH AEPB Inhale 1 puff into the lungs daily.   Yes [provider]  Ustekinumab (STELARA [place]) Inject 1 Dose into the skin every 3 (three) months. Every 12 weeks   Yes [provider]    Allergies as of 01/21/2020 - Review Complete 01/14/2020  Allergen Reaction Noted  . Sulfa antibiotics  08/30/2019    Family History  Problem Relation Age of Onset  . Heart Problems Mother   . Heart attack Father   . Heart disease Father   . Cancer Other   . Colon cancer Maternal Aunt     Social History   Socioeconomic History  . Marital status: Married    Spouse name: Not on file  . Number of children: Not on file  . Years of education: Not on file  . Highest education level: Not on file  Occupational History  . Not on file  Tobacco Use  . Smoking status: Former Smoker    Years: 40.00    Types: Cigarettes    Quit date: 09/14/2015    Years since quitting: 4.4  . Smokeless tobacco: Never Used  Substance and Sexual Activity  . Alcohol use: No    Alcohol/week: 0.0 standard drinks    Comment: Previously heavy beer drinker; quit 20 years ago  . Drug use: No  . Sexual activity: Never  Other Topics Concern  . Not on file  Social History Narrative  . Not on file   Social Determinants of Health   Financial Resource Strain:   . Difficulty of Paying Living Expenses:   Food Insecurity:   . Worried About Charity fundraiser in the Last Year:   . Arboriculturist in the Last Year:   Transportation Needs:   . Film/video editor (Medical):   Marland Kitchen Lack of Transportation (Non-Medical):   Physical Activity:   . Days of Exercise per Week:   . Minutes of Exercise per Session:   Stress:   . Feeling of Stress :   Social Connections:   . Frequency of Communication with Friends and Family:   . Frequency of  Social Gatherings with Friends and Family:   . Attends Religious Services:   . Active Member of Clubs or Organizations:   . Attends Archivist Meetings:   Marland Kitchen Marital Status:   Intimate Partner Violence:   . Fear of Current or Ex-Partner:   . Emotionally Abused:   Marland Kitchen Physically Abused:   . Sexually Abused:     Review of Systems: See HPI, otherwise negative ROS  Physical Exam: BP 130/76   Pulse (!) 54   Temp 97.7 F (36.5 C) (Oral)   Resp 14   Ht _0  (1.854 m)   SpO2 99%   BMI 24.47 kg/m  General:   Alert,  Well-developed, well-nourished, pleasant and cooperative in NAD Neck:  Supple; no masses or thyromegaly. No significant cervical adenopathy. Lungs:  Clear throughout to auscultation.   No wheezes, crackles,  or rhonchi. No acute distress. Heart:  Regular rate and rhythm; no murmurs, clicks, rubs,  or gallops. Abdomen: Non-distended, normal bowel sounds.  Soft and nontender without appreciable mass or hepatosplenomegaly.  Pulses:  Normal pulses noted. Extremities:  Without clubbing or edema.  Impression/Plan: Multiple colonic adenomas removed 2017. Patient here for surveillance examination per plan. The risks, benefits, limitations, alternatives and imponderables have been reviewed with the patient. Questions have been answered. All parties are agreeable.      Notice: This dictation was prepared with Dragon dictation along with smaller phrase technology. Any transcriptional errors that result from this process are unintentional and may not be corrected upon review.

## 2020-02-28 NOTE — Discharge Instructions (Signed)
Colonoscopy Discharge Instructions  Read the instructions outlined below and refer to this sheet in the next few weeks. These discharge instructions provide you with general information on caring for yourself after you leave the hospital. Your doctor may also give you specific instructions. While your treatment has been planned according to the most current medical practices available, unavoidable complications occasionally occur. If you have any problems or questions after discharge, call Dr. Gala Romney at [phone]. ACTIVITY  You may resume your regular activity, but move at a slower pace for the next 24 hours.   Take frequent rest periods for the next 24 hours.   Walking will help get rid of the air and reduce the bloated feeling in your belly (abdomen).   No driving for 24 hours (because of the medicine (anesthesia) used during the test).    Do not sign any important legal documents or operate any machinery for 24 hours (because of the anesthesia used during the test).  NUTRITION  Drink plenty of fluids.   You may resume your normal diet as instructed by your doctor.   Begin with a light meal and progress to your normal diet. Heavy or fried foods are harder to digest and may make you feel sick to your stomach (nauseated).   Avoid alcoholic beverages for 24 hours or as instructed.  MEDICATIONS  You may resume your normal medications unless your doctor tells you otherwise.  WHAT YOU CAN EXPECT TODAY  Some feelings of bloating in the abdomen.   Passage of more gas than usual.   Spotting of blood in your stool or on the toilet paper.  IF YOU HAD POLYPS REMOVED DURING THE COLONOSCOPY:  No aspirin products for 7 days or as instructed.   No alcohol for 7 days or as instructed.   Eat a soft diet for the next 24 hours.  FINDING OUT THE RESULTS OF YOUR TEST Not all test results are available during your visit. If your test results are not back during the visit, make an appointment  with your caregiver to find out the results. Do not assume everything is normal if you have not heard from your caregiver or the medical facility. It is important for you to follow up on all of your test results.  SEEK IMMEDIATE MEDICAL ATTENTION IF:  You have more than a spotting of blood in your stool.   Your belly is swollen (abdominal distention).   You are nauseated or vomiting.   You have a temperature over 101.   You have abdominal pain or discomfort that is severe or gets worse throughout the day.   3 polyps removed from your colon today.  Appeared to look much better than last time.  Further recommendations to follow pending review of pathology report  At patient request I called Grant Cardenas at [phone] and reviewed results   Colon Polyps  Polyps are tissue growths inside the body. Polyps can grow in many places, including the large intestine (colon). A polyp may be a round bump or a mushroom-shaped growth. You could have one polyp or several. Most colon polyps are noncancerous (benign). However, some colon polyps can become cancerous over time. Finding and removing the polyps early can help prevent this. What are the causes? The exact cause of colon polyps is not known. What increases the risk? You are more likely to develop this condition if you:  Have a family history of colon cancer or colon polyps.  Are older than 50 or older than  80 if you are African American.  Have inflammatory bowel disease, such as ulcerative colitis or Crohn's disease.  Have certain hereditary conditions, such as: ? Familial adenomatous polyposis. ? Lynch syndrome. ? Turcot syndrome. ? Peutz-Jeghers syndrome.  Are overweight.  Smoke cigarettes.  Do not get enough exercise.  Drink too much alcohol.  Eat a diet that is high in fat and red meat and low in fiber.  Had childhood cancer that was treated with abdominal radiation. What are the signs or symptoms? Most polyps do  not cause symptoms. If you have symptoms, they may include:  Blood coming from your rectum when having a bowel movement.  Blood in your stool. The stool may look dark red or black.  Abdominal pain.  A change in bowel habits, such as constipation or diarrhea. How is this diagnosed? This condition is diagnosed with a colonoscopy. This is a procedure in which a lighted, flexible scope is inserted into the anus and then passed into the colon to examine the area. Polyps are sometimes found when a colonoscopy is done as part of routine cancer screening tests. How is this treated? Treatment for this condition involves removing any polyps that are found. Most polyps can be removed during a colonoscopy. Those polyps will then be tested for cancer. Additional treatment may be needed depending on the results of testing. Follow these instructions at home: Lifestyle  Maintain a healthy weight, or lose weight if recommended by your health care provider.  Exercise every day or as told by your health care provider.  Do not use any products that contain nicotine or tobacco, such as cigarettes and e-cigarettes. If you need help quitting, ask your health care provider.  If you drink alcohol, limit how much you have: ? 0-1 drink a day for women. ? 0-2 drinks a day for men.  Be aware of how much alcohol is in your drink. In the [place] one drink equals one 12 oz bottle of beer (355 mL), one 5 oz glass of wine (148 mL), or one 1 oz shot of hard liquor (44 mL). Eating and drinking   Eat foods that are high in fiber, such as fruits, vegetables, and whole grains.  Eat foods that are high in calcium and vitamin D, such as milk, cheese, yogurt, eggs, liver, fish, and broccoli.  Limit foods that are high in fat, such as fried foods and desserts.  Limit the amount of red meat and processed meat you eat, such as hot dogs, sausage, bacon, and lunch meats. General instructions  Keep all follow-up visits as  told by your health care provider. This is important. ? This includes having regularly scheduled colonoscopies. ? Talk to your health care provider about when you need a colonoscopy. Contact a health care provider if:  You have new or worsening bleeding during a bowel movement.  You have new or increased blood in your stool.  You have a change in bowel habits.  You lose weight for no known reason. Summary  Polyps are tissue growths inside the body. Polyps can grow in many places, including the colon.  Most colon polyps are noncancerous (benign), but some can become cancerous over time.  This condition is diagnosed with a colonoscopy.  Treatment for this condition involves removing any polyps that are found. Most polyps can be removed during a colonoscopy. This information is not intended to replace advice given to you by your health care provider. Make sure you discuss any questions you have with  your health care provider. Document Revised: 01/04/2018 Document Reviewed: 01/04/2018 Elsevier Patient Education  [place].

## 2020-02-28 NOTE — Op Note (Signed)
[facility] Patient Name: Grant Cardenas Procedure Date: 02/28/2020 8:58 AM MRN: [id number] Date of Birth: [birth year] Attending MD: Norvel Richards , MD CSN: [id number] Age: 64 Admit Type: Outpatient Procedure:                Colonoscopy Indications:              High risk colon cancer surveillance: Personal                            history of colonic polyps Providers:                Norvel Richards, MD, Otis Peak B. Gwenlyn Perking RN, RN,                            Aram Candela Referring MD:              Medicines:                Midazolam 4 mg IV, Meperidine 40 mg IV, Ondansetron                            4 mg IV Complications:            No immediate complications. Estimated Blood Loss:     Estimated blood loss was minimal. Procedure:                Pre-Anesthesia Assessment:                           - Prior to the procedure, a History and Physical                            was performed, and patient medications and                            allergies were reviewed. The patient's tolerance of                            previous anesthesia was also reviewed. The risks                            and benefits of the procedure and the sedation                            options and risks were discussed with the patient.                            All questions were answered, and informed consent                            was obtained. Prior Anticoagulants: The patient has                            taken no previous anticoagulant or antiplatelet  agents. ASA Grade Assessment: II - A patient with                            mild systemic disease. After reviewing the risks                            and benefits, the patient was deemed in                            satisfactory condition to undergo the procedure.                           After obtaining informed consent, the colonoscope                            was passed under direct vision.  Throughout the                            procedure, the patient's blood pressure, pulse, and                            oxygen saturations were monitored continuously. The                            [id number] scope was introduced through                            the anus and advanced to the the cecum, identified                            by appendiceal orifice and ileocecal valve. The                            colonoscopy was performed without difficulty. The                            patient tolerated the procedure well. The quality                            of the bowel preparation was adequate. The                            ileocecal valve, appendiceal orifice, and rectum                            were photographed. Scope In: 9:23:02 AM Scope Out: 9:41:43 AM Scope Withdrawal Time: 0 hours 13 minutes 25 seconds  Total Procedure Duration: 0 hours 18 minutes 41 seconds  Findings:      The perianal and digital rectal examinations were normal.      Three sessile polyps were found in the sigmoid colon and descending       colon. The polyps were 3 to 5 mm in size. These polyps were removed with       a cold snare. Resection and  retrieval were complete. Estimated blood       loss was minimal.      The exam was otherwise without abnormality on direct and retroflexion       views. Site of tattoo identified in the sigmoid segment (not the       rectum). Associated mucosa at this level appeared entirely normal       otherwise. Rectal mucosa well-seen and also appeared normal. Impression:               - Three 3 to 5 mm polyps in the sigmoid colon and                            in the descending colon, removed with a cold snare.                            Resected and retrieved.                           - The examination was otherwise normal on direct                            and retroflexion views. Moderate Sedation:      Moderate (conscious) sedation was administered by  the endoscopy nurse       and supervised by the endoscopist. The following parameters were       monitored: oxygen saturation, heart rate, blood pressure, respiratory       rate, EKG, adequacy of pulmonary ventilation, and response to care.       Total physician intraservice time was 22 minutes. Recommendation:           - Patient has a contact number available for                            emergencies. The signs and symptoms of potential                            delayed complications were discussed with the                            patient. Return to normal activities tomorrow.                            Written discharge instructions were provided to the                            patient.                           - Resume previous diet.                           - Continue present medications.                           - Repeat colonoscopy date to be determined after  pending pathology results are reviewed for                            surveillance based on pathology results.                           - Return to GI office (date not yet determined). Procedure Code(s):        --- Professional ---                           [phone], Colonoscopy, flexible; with removal of                            tumor(s), polyp(s), or other lesion(s) by snare                            technique                           G0500, Moderate sedation services provided by the                            same physician or other qualified health care                            professional performing a gastrointestinal                            endoscopic service that sedation supports,                            requiring the presence of an independent trained                            observer to assist in the monitoring of the                            patient's level of consciousness and physiological                            status; initial 15 minutes of intra-service  time;                            patient age 15 years or older (additional time may                            be reported with [phone], as appropriate) Diagnosis Code(s):        --- Professional ---                           Z86.010, Personal history of colonic polyps                           K63.5, Polyp of colon CPT copyright 2019 American [employer]. All rights reserved. The codes  documented in this report are preliminary and upon coder review may  be revised to meet current compliance requirements. Cristopher Estimable. Jeremiah Curci, MD Norvel Richards, MD 02/28/2020 9:57:31 AM This report has been signed electronically. Number of Addenda: 0

## 2020-03-03 ENCOUNTER — Encounter: Payer: Self-pay | Admitting: Internal Medicine

## 2020-03-03 LAB — SURGICAL PATHOLOGY

## 2020-07-02 ENCOUNTER — Other Ambulatory Visit: Payer: Self-pay

## 2020-07-02 ENCOUNTER — Encounter: Payer: Self-pay | Admitting: Cardiovascular Disease

## 2020-07-02 ENCOUNTER — Ambulatory Visit (INDEPENDENT_AMBULATORY_CARE_PROVIDER_SITE_OTHER): Payer: Medicare HMO | Admitting: Cardiovascular Disease

## 2020-07-02 VITALS — BP 116/60 | HR 52 | Ht 73.0 in | Wt 191.2 lb

## 2020-07-02 DIAGNOSIS — I255 Ischemic cardiomyopathy: Secondary | ICD-10-CM

## 2020-07-02 DIAGNOSIS — I2510 Atherosclerotic heart disease of native coronary artery without angina pectoris: Secondary | ICD-10-CM

## 2020-07-02 DIAGNOSIS — R0989 Other specified symptoms and signs involving the circulatory and respiratory systems: Secondary | ICD-10-CM

## 2020-07-02 DIAGNOSIS — E785 Hyperlipidemia, unspecified: Secondary | ICD-10-CM | POA: Diagnosis not present

## 2020-07-02 NOTE — Progress Notes (Signed)
Cardiology Office Note   Date:  07/02/2020   ID:  Grant Cardenas, DOB 1956/07/02, MRN [id number]  PCP:  [facility]  Cardiologist:   Kathlyn Sacramento, MD   Chief Complaint  Patient presents with  . Follow-up    12 month. Meds reviewed by the pt. verbally. Pt. c/o chest pain several times within the past two months as well as shortness of breath.       History of Present Illness: Grant Cardenas is a 64 y.o. male who presents for  a follow-up visit regarding coronary artery disease and ischemic cardiomyopathy. He presented in December 2016 with anterior ST elevation myocardial infarction. Cardiac catheterization showed occluded proximal LAD and 60% ostial RCA stenosis. There was also very distal disease affecting the LAD and ramus. He underwent successful angioplasty and drug-eluting stent placement to the LAD. Ejection fraction was 35-40% which improved subsequently to 45%. His other medical problems include psoriasis, Previous tobacco use, COPD and hyperlipidemia.  He is known to have right carotid bruit but previous carotid Doppler showed no obstructive disease.   He had an echocardiogram done at the [place] in May which showed an EF of 50 to 55% with mild anterior, anteroseptal and apical hypokinesis.  The patient has been doing well.  He reports 2 brief episodes of chest pain about 1 to 2 months ago while he was doing some intense yard work but he has not had any further episodes since then.  He feels well.  Past Medical History:  Diagnosis Date  . CAD (coronary artery disease)    a. anterior ST elevation MI in 09/2015. Cardiac cath showed occluded prox LAD and 60% ostial RCA stenosis. There was also very distal disease affecting the LAD and ramus. He underwent successful angioplasty and drug-eluting stent placement to the LAD. Ejection fraction was 35-40% which improved subsequently an echo to 55-60%.  . Chronic obstructive pulmonary disease,  unspecified ([place])   . COPD (chronic obstructive pulmonary disease) ([place]) 03/2016   4th stage-Dr. Luan Pulling  . Essential (primary) hypertension   . Hyperlipidemia   . Ischemic cardiomyopathy    a. EF by cath 09/13/2015: 35-40%; b. 09/14/2015: EF 55-60%, Probable akinesis of  the midanteroseptal myocardium. akinesis of the apicalanterior myocardium, GR1DD  . MI (myocardial infarction) ([place])   . Psoriasis   . Psoriasis, unspecified   . Tobacco use   . Vitreous floaters of both eyes     Past Surgical History:  Procedure Laterality Date  . CARDIAC CATHETERIZATION N/A 09/13/2015   Procedure: Left Heart Cath and Coronary Angiography;  Surgeon: Wellington Hampshire, MD;  Location: [place] [facility];  Service: Cardiovascular;  Laterality: N/A;  . CARDIAC CATHETERIZATION N/A 09/13/2015   Procedure: Coronary Stent Intervention;  Surgeon: Wellington Hampshire, MD;  Location: [place] [facility];  Service: Cardiovascular;  Laterality: N/A;  . CARDIAC CATHETERIZATION N/A 11/03/2015   Procedure: Left Heart Cath and Coronary Angiography;  Surgeon: Peter M Martinique, MD;  Location: [place] [facility];  Service: Cardiovascular;  Laterality: N/A;  . COLONOSCOPY N/A 01/29/2016   Procedure: COLONOSCOPY;  Surgeon: Danie Binder, MD;  [facility];  Service: Endoscopy;  Laterality: N/A;  1030-moved to 1215 office to notify   . COLONOSCOPY N/A 02/28/2020   Procedure: COLONOSCOPY;  Surgeon: Daneil Dolin, MD;  [facility];  Service: Endoscopy;  Laterality: N/A;  9:30  . ESOPHAGOGASTRODUODENOSCOPY N/A 01/29/2016   Procedure: ESOPHAGOGASTRODUODENOSCOPY (EGD);  Surgeon: Danie Binder, MD;  [facility];  Service: Endoscopy;  Laterality: N/A;  . POLYPECTOMY  02/28/2020   Procedure: POLYPECTOMY;  Surgeon: Daneil Dolin, MD;  [facility];  Service: Endoscopy;;     Current Outpatient Medications  Medication Sig Dispense Refill  . Albuterol Sulfate 108 (90 Base) MCG/ACT AEPB  Inhale 1-2 puffs into the lungs 4 (four) times daily as needed (wheezing/shortness of breath.).     Marland Kitchen aspirin EC 81 MG tablet Take 1 tablet (81 mg total) by mouth daily. 90 tablet 3  . atorvastatin (LIPITOR) 80 MG tablet Take 40 mg by mouth daily with supper.     . Carboxymethylcellulose Sod PF 0.25 % SOLN Place 1 drop into both eyes 4 (four) times daily as needed (dry/irritated eyes.).     Marland Kitchen carvedilol (COREG) 3.125 MG tablet Take 1 tablet (3.125 mg total) by mouth 2 (two) times daily with a meal. 60 tablet 6  . clobetasol ointment (TEMOVATE) 9.16 % Apply 1 application topically 2 (two) times daily as needed (psoriasis).   1  . fluticasone (CUTIVATE) 0.05 % cream Apply 1 application topically 2 (two) times daily as needed (psoriasis (face & groin areas)).     Marland Kitchen ketoconazole (NIZORAL) 2 % shampoo Apply 1 application topically 2 (two) times a week. Applied to facial hair (beard) & scalp. Leave on 10 minutes prior to washing out.    Marland Kitchen losartan (COZAAR) 50 MG tablet Take 50 mg by mouth daily.    . nitroGLYCERIN (NITROSTAT) 0.4 MG SL tablet Place 1 tablet (0.4 mg total) under the tongue every 5 (five) minutes as needed for chest pain. 25 tablet 12  . SUPREP BOWEL PREP KIT 17.5-3.13-1.6 GM/177ML SOLN Take 354 mLs by mouth once.    . umeclidinium-vilanterol (ANORO ELLIPTA) 62.5-25 MCG/INH AEPB Inhale 1 puff into the lungs daily.    Marland Kitchen Ustekinumab (STELARA [place]) Inject 1 Dose into the skin every 3 (three) months. Every 12 weeks     No current facility-administered medications for this visit.    Allergies:   Sulfa antibiotics    Social History:  The patient  reports that he quit smoking about 4 years ago. His smoking use included cigarettes. He quit after 40.00 years of use. He has never used smokeless tobacco. He reports that he does not drink alcohol and does not use drugs.   Family History:  The patient's family history includes Cancer in an other family member; Colon cancer in his maternal aunt; Heart  Problems in his mother; Heart attack in his father; Heart disease in his father.      PHYSICAL EXAM: VS:  BP 116/60 (BP Location: Left Arm, Patient Position: Sitting, Cuff Size: Normal)   Pulse (!) 52   Ht '6\' 1"'  (1.854 m)   Wt 191 lb 4 oz (86.8 kg)   SpO2 98%   BMI 25.23 kg/m  , BMI Body mass index is 25.23 kg/m. GEN: Well nourished, well developed, in no acute distress  HEENT: normal  Neck: no JVD or masses. Right carotid bruit Cardiac: RRR; no murmurs, rubs, or gallops,no edema  Respiratory:  clear to auscultation bilaterally, normal work of breathing GI: soft, nontender, nondistended, + BS MS: no deformity or atrophy  Skin: warm and dry, no rash Neuro:  Strength and sensation are intact Psych: euthymic mood, full affect   EKG:  EKG  ordered today. EKG showed sinus bradycardia with no significant ST or T wave changes.   Recent  Labs: No results found for requested labs within last 8760 hours.    Lipid Panel    Component Value Date/Time   CHOL 151 11/08/2018 0000   CHOL 212 (H) 09/23/2015 1540   TRIG 89 11/08/2018 0000   HDL 43 11/08/2018 0000   HDL 41 09/23/2015 1540   CHOLHDL 3.7 11/26/2015 0815   VLDL 17 11/26/2015 0815   LDLCALC 90 11/08/2018 0000   LDLCALC 137 (H) 09/23/2015 1540      Wt Readings from Last 3 Encounters:  07/02/20 191 lb 4 oz (86.8 kg)  06/06/19 185 lb 8 oz (84.1 kg)  03/08/18 189 lb 8 oz (86 kg)        ASSESSMENT AND PLAN:  1.  Coronary artery disease involving native coronary arteries of native heart without angina :  He is doing well overall. Continue aspirin indefinitely.  2.  Ischemic cardiomyopathy: Most recent echocardiogram showed an EF of 50 to 55%.  Continue small dose carvedilol and losartan.  No evidence of volume overload.  3. Hyperlipidemia :  Continue treatment with atorvastatin 40 mg daily.  He reports that he had elevated liver enzymes on high-dose atorvastatin.  If LDL is still above 70, I recommend adding Zetia.     4. Right carotid bruit: Carotid Doppler in 2018 showed mild nonobstructive disease.     Disposition:   FU with me in 12 months  Signed,  Kathlyn Sacramento, MD  07/02/2020 11:10 AM    [place]

## 2020-07-02 NOTE — Patient Instructions (Addendum)
Medication Instructions:   Your physician recommends that you continue on your current medications as directed. Please refer to the Current Medication list given to you today.  Talk with your Cardiologist at the [facility] about adding Zetia if your cholesterol LDL is above 70.    *If you need a refill on your cardiac medications before your next appointment, please call your pharmacy*   Lab Work: None ordered If you have labs (blood work) drawn today and your tests are completely normal, you will receive your results only by: Marland Kitchen MyChart Message (if you have MyChart) OR . A paper copy in the mail If you have any lab test that is abnormal or we need to change your treatment, we will call you to review the results.   Testing/Procedures: None ordered   Follow-Up: At [facility], you and your health needs are our priority.  As part of our continuing mission to provide you with exceptional heart care, we have created designated Provider Care Teams.  These Care Teams include your primary Cardiologist (physician) and Advanced Practice Providers (APPs -  Physician Assistants and Nurse Practitioners) who all work together to provide you with the care you need, when you need it.  We recommend signing up for the patient portal called "MyChart".  Sign up information is provided on this After Visit Summary.  MyChart is used to connect with patients for Virtual Visits (Telemedicine).  Patients are able to view lab/test results, encounter notes, upcoming appointments, etc.  Non-urgent messages can be sent to your provider as well.   To learn more about what you can do with MyChart, go to [email or web address].    Your next appointment:   12 month(s)  The format for your next appointment:   In Person  Provider:   You may see Dr. Fletcher Anon or one of the following Advanced Practice Providers on your designated Care Team:    Murray Hodgkins, NP  Christell Faith, PA-C  Marrianne Mood, PA-C  [place]  Kathlen Mody, [place]    Other Instructions N/A

## 2020-10-26 DIAGNOSIS — Z20828 Contact with and (suspected) exposure to other viral communicable diseases: Secondary | ICD-10-CM | POA: Diagnosis not present

## 2020-11-28 ENCOUNTER — Other Ambulatory Visit: Payer: Self-pay

## 2020-11-28 ENCOUNTER — Emergency Department (HOSPITAL_COMMUNITY)
Admission: EM | Admit: 2020-11-28 | Discharge: 2020-11-28 | Disposition: A | Discharge status: Home / Self Care | Payer: No Typology Code available for payment source | Attending: Emergency Medicine | Admitting: Emergency Medicine

## 2020-11-28 ENCOUNTER — Encounter (HOSPITAL_COMMUNITY): Payer: Self-pay | Admitting: *Deleted

## 2020-11-28 DIAGNOSIS — R631 Polydipsia: Secondary | ICD-10-CM | POA: Diagnosis not present

## 2020-11-28 DIAGNOSIS — Z7982 Long term (current) use of aspirin: Secondary | ICD-10-CM | POA: Diagnosis not present

## 2020-11-28 DIAGNOSIS — I25118 Atherosclerotic heart disease of native coronary artery with other forms of angina pectoris: Secondary | ICD-10-CM | POA: Diagnosis not present

## 2020-11-28 DIAGNOSIS — J449 Chronic obstructive pulmonary disease, unspecified: Secondary | ICD-10-CM | POA: Diagnosis not present

## 2020-11-28 DIAGNOSIS — R739 Hyperglycemia, unspecified: Secondary | ICD-10-CM | POA: Diagnosis not present

## 2020-11-28 DIAGNOSIS — Z79899 Other long term (current) drug therapy: Secondary | ICD-10-CM | POA: Insufficient documentation

## 2020-11-28 DIAGNOSIS — I10 Essential (primary) hypertension: Secondary | ICD-10-CM | POA: Insufficient documentation

## 2020-11-28 DIAGNOSIS — R3589 Other polyuria: Secondary | ICD-10-CM | POA: Insufficient documentation

## 2020-11-28 LAB — CBC WITH DIFFERENTIAL/PLATELET
Abs Immature Granulocytes: 0.05 10*3/uL (ref 0.00–0.07)
Basophils Absolute: 0 10*3/uL (ref 0.0–0.1)
Basophils Relative: 0 %
Eosinophils Absolute: 0.4 10*3/uL (ref 0.0–0.5)
Eosinophils Relative: 6 %
HCT: 46 % (ref 39.0–52.0)
Hemoglobin: 16 g/dL (ref 13.0–17.0)
Immature Granulocytes: 1 %
Lymphocytes Relative: 29 %
Lymphs Abs: 1.6 10*3/uL (ref 0.7–4.0)
MCH: 30 pg (ref 26.0–34.0)
MCHC: 34.8 g/dL (ref 30.0–36.0)
MCV: 86.1 fL (ref 80.0–100.0)
Monocytes Absolute: 0.5 10*3/uL (ref 0.1–1.0)
Monocytes Relative: 9 %
Neutro Abs: 3 10*3/uL (ref 1.7–7.7)
Neutrophils Relative %: 55 %
Platelets: 235 10*3/uL (ref 150–400)
RBC: 5.34 MIL/uL (ref 4.22–5.81)
RDW: 11.8 % (ref 11.5–15.5)
WBC: 5.5 10*3/uL (ref 4.0–10.5)
nRBC: 0 % (ref 0.0–0.2)

## 2020-11-28 LAB — COMPREHENSIVE METABOLIC PANEL
ALT: 94 U/L — ABNORMAL HIGH (ref 0–44)
AST: 39 U/L (ref 15–41)
Albumin: 4.4 g/dL (ref 3.5–5.0)
Alkaline Phosphatase: 101 U/L (ref 38–126)
Anion gap: 12 (ref 5–15)
BUN: 21 mg/dL (ref 8–23)
CO2: 19 mmol/L — ABNORMAL LOW (ref 22–32)
Calcium: 9.3 mg/dL (ref 8.9–10.3)
Chloride: 99 mmol/L (ref 98–111)
Creatinine, Ser: 1.14 mg/dL (ref 0.61–1.24)
GFR, Estimated: >60 mL/min (ref >60)
Glucose, Bld: 448 mg/dL — ABNORMAL HIGH (ref 70–99)
Potassium: 4.5 mmol/L (ref 3.5–5.1)
Sodium: 130 mmol/L — ABNORMAL LOW (ref 135–145)
Total Bilirubin: 2.7 mg/dL — ABNORMAL HIGH (ref 0.3–1.2)
Total Protein: 7.7 g/dL (ref 6.5–8.1)

## 2020-11-28 LAB — URINALYSIS, ROUTINE W REFLEX MICROSCOPIC
Bacteria, UA: NONE SEEN
Bilirubin Urine: NEGATIVE
Glucose, UA: >=500 mg/dL — AB
Ketones, ur: 20 mg/dL — AB
Leukocytes,Ua: NEGATIVE
Nitrite: NEGATIVE
Protein, ur: NEGATIVE mg/dL
Specific Gravity, Urine: 1.031 — ABNORMAL HIGH (ref 1.005–1.030)
pH: 5 (ref 5.0–8.0)

## 2020-11-28 LAB — CBG MONITORING, ED: Glucose-Capillary: 449 mg/dL — ABNORMAL HIGH (ref 70–99)

## 2020-11-28 MED ORDER — SODIUM CHLORIDE 0.9 % IV BOLUS
500.0000 mL | Freq: Once | INTRAVENOUS | Status: AC
  Administered 2020-11-28: 500 mL via INTRAVENOUS

## 2020-11-28 MED ORDER — METFORMIN HCL 500 MG PO TABS
500.0000 mg | ORAL_TABLET | Freq: Two times a day (BID) | ORAL | 0 refills | Status: DC
Start: 2020-11-28 — End: 2021-01-11

## 2020-11-28 NOTE — ED Triage Notes (Signed)
States he had covid last month and since then his glucose has been high. States he was diet controlled prior to that. States he has had urinary frequency

## 2020-11-28 NOTE — Discharge Instructions (Addendum)
Like we discussed, I am going to restart you on Metformin.  You are going to take 500 mg twice per day.  Please only take this as prescribed.  I have given you a 1 month supply of this medication, so please make sure that you follow-up at the [place] at your regularly scheduled appointment to recheck your blood glucose and determine future medication needs.  Please continue to monitor your blood glucose daily and journal these results.  This will help your regular doctor immensely in determining if your blood glucose is being managed properly.  If you develop new or worsening symptoms such as fatigue, confusion, nausea, vomiting, please return to the emergency department for immediate reevaluation.  It was a pleasure to meet you and your wife.

## 2020-11-28 NOTE — ED Triage Notes (Signed)
Elevated blood sugar 

## 2020-11-28 NOTE — ED Provider Notes (Signed)
[facility] EMERGENCY DEPARTMENT Provider Note   CSN: [id number] Arrival date & time: 11/28/20  1214     History Chief Complaint  Patient presents with  . Hyperglycemia    Grant Cardenas is a 65 y.o. male.  HPI Patient is a 65 year old male with history of CAD, ischemic cardiomyopathy, COPD, who presents the emergency department due to elevated blood glucose.  Patient states for the past week he has noticed that his glucose has been more elevated than normal.  Appears to be in the 300s, per patient.  His wife is concerned and brought him to the emergency department for evaluation.  He reports associated polyuria and polydipsia.  Patient states in the past he has taken Metformin.  He states that his A1c was less than 7 so this medication was discontinued.  Patient has no other physical complaints at this time.  Denies any chest pain, shortness of breath, abdominal pain, nausea, vomiting, fevers, chills.  Patient is vaccinated for COVID-19 x3.  Per records, patient had an echocardiogram in May 2021 which showed an EF of 50 to 55% with mild anterior anteroseptal and apical hypokinesis.   Past Medical History:  Diagnosis Date  . CAD (coronary artery disease)    a. anterior ST elevation MI in 09/2015. Cardiac cath showed occluded prox LAD and 60% ostial RCA stenosis. There was also very distal disease affecting the LAD and ramus. He underwent successful angioplasty and drug-eluting stent placement to the LAD. Ejection fraction was 35-40% which improved subsequently an echo to 55-60%.  . Chronic obstructive pulmonary disease, unspecified ([place])   . COPD (chronic obstructive pulmonary disease) ([place]) 03/2016   4th stage-Dr. Luan Pulling  . Essential (primary) hypertension   . Hyperlipidemia   . Ischemic cardiomyopathy    a. EF by cath 09/13/2015: 35-40%; b. 09/14/2015: EF 55-60%, Probable akinesis of  the midanteroseptal myocardium. akinesis of the apicalanterior myocardium, GR1DD  . MI  (myocardial infarction) ([place])   . Psoriasis   . Psoriasis, unspecified   . Tobacco use   . Vitreous floaters of both eyes     Patient Active Problem List   Diagnosis Date Noted  . Essential (primary) hypertension   . Chronic obstructive pulmonary disease, unspecified ([place])   . Psoriasis, unspecified   . Occult blood in stools   . GERD (gastroesophageal reflux disease) 12/16/2015  . Heme + stool 12/16/2015  . Pain in the chest non cardiac, may be GI or Muscular skeletal.    . Unstable angina ([place]) 11/02/2015  . Cardiomyopathy, ischemic 09/23/2015  . Coronary artery disease involving native coronary artery with angina pectoris ([place])   . Hyperlipidemia   . Tobacco use   . Hyperlipidemia LDL goal <70   . ST elevation (STEMI) myocardial infarction involving left anterior descending coronary artery [facility])  09/2015 09/13/2015    Past Surgical History:  Procedure Laterality Date  . CARDIAC CATHETERIZATION N/A 09/13/2015   Procedure: Left Heart Cath and Coronary Angiography;  Surgeon: Wellington Hampshire, MD;  Location: [place] [facility];  Service: Cardiovascular;  Laterality: N/A;  . CARDIAC CATHETERIZATION N/A 09/13/2015   Procedure: Coronary Stent Intervention;  Surgeon: Wellington Hampshire, MD;  Location: [place] [facility];  Service: Cardiovascular;  Laterality: N/A;  . CARDIAC CATHETERIZATION N/A 11/03/2015   Procedure: Left Heart Cath and Coronary Angiography;  Surgeon: Peter M Martinique, MD;  Location: [place] [facility];  Service: Cardiovascular;  Laterality: N/A;  . COLONOSCOPY N/A 01/29/2016   Procedure: COLONOSCOPY;  Surgeon:  Danie Binder, MD;  [facility];  Service: Endoscopy;  Laterality: N/A;  1030-moved to 1215 office to notify   . COLONOSCOPY N/A 02/28/2020   Procedure: COLONOSCOPY;  Surgeon: Daneil Dolin, MD;  [facility];  Service: Endoscopy;  Laterality: N/A;  9:30  . ESOPHAGOGASTRODUODENOSCOPY N/A 01/29/2016   Procedure: ESOPHAGOGASTRODUODENOSCOPY  (EGD);  Surgeon: Danie Binder, MD;  [facility];  Service: Endoscopy;  Laterality: N/A;  . POLYPECTOMY  02/28/2020   Procedure: POLYPECTOMY;  Surgeon: Daneil Dolin, MD;  [facility];  Service: Endoscopy;;       Family History  Problem Relation Age of Onset  . Heart Problems Mother   . Heart attack Father   . Heart disease Father   . Cancer Other   . Colon cancer Maternal Aunt     Social History   Tobacco Use  . Smoking status: Former Smoker    Years: 40.00    Types: Cigarettes    Quit date: 09/14/2015    Years since quitting: 5.2  . Smokeless tobacco: Never Used  Substance Use Topics  . Alcohol use: No    Alcohol/week: 0.0 standard drinks    Comment: Previously heavy beer drinker; quit 20 years ago  . Drug use: No    Home Medications Prior to Admission medications   Medication Sig Start Date End Date Taking? Authorizing Provider  metFORMIN (GLUCOPHAGE) 500 MG tablet Take 1 tablet (500 mg total) by mouth 2 (two) times daily with a meal. 11/28/20 12/28/20 Yes Rayna Sexton, PA-C  Albuterol Sulfate 108 (90 Base) MCG/ACT AEPB Inhale 1-2 puffs into the lungs 4 (four) times daily as needed (wheezing/shortness of breath.).     [provider]  aspirin EC 81 MG tablet Take 1 tablet (81 mg total) by mouth daily. 09/23/15   Wellington Hampshire, MD  atorvastatin (LIPITOR) 80 MG tablet Take 40 mg by mouth daily with supper.  01/10/20   [provider]  Carboxymethylcellulose Sod PF 0.25 % SOLN Place 1 drop into both eyes 4 (four) times daily as needed (dry/irritated eyes.).     [provider]  carvedilol (COREG) 3.125 MG tablet Take 1 tablet (3.125 mg total) by mouth 2 (two) times daily with a meal. 09/08/16   Wellington Hampshire, MD  clobetasol ointment (TEMOVATE) 7.85 % Apply 1 application topically 2 (two) times daily as needed (psoriasis).  10/25/15   [provider]  fluticasone (CUTIVATE) 0.05 % cream Apply 1 application  topically 2 (two) times daily as needed (psoriasis (face & groin areas)).  05/07/19   [provider]  ketoconazole (NIZORAL) 2 % shampoo Apply 1 application topically 2 (two) times a week. Applied to facial hair (beard) & scalp. Leave on 10 minutes prior to washing out.    [provider]  losartan (COZAAR) 50 MG tablet Take 50 mg by mouth daily. 04/17/20   [provider]  nitroGLYCERIN (NITROSTAT) 0.4 MG SL tablet Place 1 tablet (0.4 mg total) under the tongue every 5 (five) minutes as needed for chest pain. 09/16/15   Bhagat, Bhavinkumar, PA  SUPREP BOWEL PREP KIT 17.5-3.13-1.6 GM/177ML SOLN Take 354 mLs by mouth once. 01/14/20   [provider]  umeclidinium-vilanterol (ANORO ELLIPTA) 62.5-25 MCG/INH AEPB Inhale 1 puff into the lungs daily.    [provider]  Ustekinumab (STELARA [place]) Inject 1 Dose into the skin every 3 (three) months. Every 12 weeks    [provider]  Allergies    Sulfa antibiotics  Review of Systems   Review of Systems  All other systems reviewed and are negative. Ten systems reviewed and are negative for acute change, except as noted in the HPI.    Physical Exam Updated Vital Signs BP (!) 112/91   Pulse 60   Temp 97.9 F (36.6 C) (Oral)   Resp 16   Ht (!) 6" (0.152 m)   Wt 80.7 kg   SpO2 99%   BMI 3476.32 kg/m   Physical Exam Vitals and nursing note reviewed.  Constitutional:      General: He is not in acute distress.    Appearance: Normal appearance. He is not ill-appearing, toxic-appearing or diaphoretic.  HENT:     Head: Normocephalic and atraumatic.     Right Ear: External ear normal.     Left Ear: External ear normal.     Nose: Nose normal.     Mouth/Throat:     Mouth: Mucous membranes are dry.     Pharynx: Oropharynx is clear. No oropharyngeal exudate or posterior oropharyngeal erythema.  Eyes:     General: No scleral icterus.       Right eye: No discharge.        Left eye: No discharge.      Extraocular Movements: Extraocular movements intact.     Conjunctiva/sclera: Conjunctivae normal.  Cardiovascular:     Rate and Rhythm: Normal rate and regular rhythm.     Pulses: Normal pulses.     Heart sounds: Normal heart sounds. No murmur heard. No friction rub. No gallop.   Pulmonary:     Effort: Pulmonary effort is normal. No respiratory distress.     Breath sounds: Normal breath sounds. No stridor. No wheezing, rhonchi or rales.  Abdominal:     General: Abdomen is flat.     Palpations: Abdomen is soft.     Tenderness: There is no abdominal tenderness.     Comments: Abdomen is flat, soft, and nontender.  Musculoskeletal:        General: Normal range of motion.     Cervical back: Normal range of motion and neck supple. No tenderness.  Skin:    General: Skin is warm and dry.  Neurological:     General: No focal deficit present.     Mental Status: He is alert and oriented to person, place, and time.  Psychiatric:        Mood and Affect: Mood normal.        Behavior: Behavior normal.    ED Results / Procedures / Treatments   Labs (all labs ordered are listed, but only abnormal results are displayed) Labs Reviewed  COMPREHENSIVE METABOLIC PANEL - Abnormal; Notable for the following components:      Result Value   Sodium 130 (*)    CO2 19 (*)    Glucose, Bld 448 (*)    ALT 94 (*)    Total Bilirubin 2.7 (*)    All other components within normal limits  URINALYSIS, ROUTINE W REFLEX MICROSCOPIC - Abnormal; Notable for the following components:   Specific Gravity, Urine 1.031 (*)    Glucose, UA >=500 (*)    Hgb urine dipstick SMALL (*)    Ketones, ur 20 (*)    All other components within normal limits  CBG MONITORING, ED - Abnormal; Notable for the following components:   Glucose-Capillary 449 (*)    All other components within normal limits  CBC WITH DIFFERENTIAL/PLATELET   EKG None  Radiology No results found.  Procedures Procedures   Medications  Ordered in ED Medications  sodium chloride 0.9 % bolus 500 mL (500 mLs Intravenous New Bag/Given 11/28/20 1348)    ED Course  I have reviewed the triage vital signs and the nursing notes.  Pertinent labs & imaging results that were available during my care of the patient were reviewed by me and considered in my medical decision making (see chart for details).    MDM Rules/Calculators/A&P                          Pt is a 65 y.o. male who presents the emergency department with elevated blood glucose, polyuria, and polydipsia for the past week.  Labs: CBC without abnormalities. CMP with a corrected sodium of 136, CO2 of 19, glucose of 448, ALT of 94, total bilirubin of 2.7.  No elevated anion gap.  Normal kidney function. UA with an elevated specific gravity of 1.031, glucosuria greater than 500, small hemoglobin, 20 ketones.  I, Rayna Sexton, PA-C, personally reviewed and evaluated these images and lab results as part of my medical decision-making.  Physical exam is reassuring.  Patient does appear mildly dehydrated but besides the polyuria and polydipsia, has no complaints.  No abdominal tenderness.  No nausea or vomiting.  No fevers or recent illnesses.  Vital signs are within normal limits.  No elevation in his anion gap.  Does not appear to be in DKA at this time.  Patient given 500 cc of IV fluids.  Patient states he used to take Metformin in the past but this was discontinued many years ago due to his A1c normalizing.  Will restart patient on Metformin.  Discussed return precautions.  [facility]  His questions were answered and he was amicable to time of discharge.  Note: Portions of this report may have been transcribed using voice recognition software. Every effort was made to ensure accuracy; however, inadvertent computerized transcription errors may be present.   Final Clinical Impression(s) / ED Diagnoses Final  diagnoses:  Hyperglycemia   Rx / DC Orders ED Discharge Orders         Ordered    metFORMIN (GLUCOPHAGE) 500 MG tablet  2 times daily with meals        11/28/20 1429           Rayna Sexton, PA-C 11/28/20 1432    Milton Ferguson, MD 11/30/20 1732

## 2020-12-03 LAB — CBC AND DIFFERENTIAL
HCT: 43 (ref 41–53)
Hemoglobin: 15 (ref 13.5–17.5)

## 2020-12-03 LAB — HEMOGLOBIN A1C: Hemoglobin A1C: 11.5

## 2020-12-03 LAB — LIPID PANEL
Cholesterol: 86 (ref 0–200)
LDL Cholesterol: 26
Triglycerides: 115 (ref 40–160)

## 2020-12-03 LAB — HEPATIC FUNCTION PANEL
ALT: 69 — AB (ref 10–40)
AST: 24 (ref 14–40)

## 2020-12-03 LAB — BASIC METABOLIC PANEL
Creatinine: 1.3 (ref 0.6–1.3)
Glucose: 389

## 2021-01-11 ENCOUNTER — Encounter: Payer: Self-pay | Admitting: Nurse Practitioner

## 2021-01-11 ENCOUNTER — Ambulatory Visit (INDEPENDENT_AMBULATORY_CARE_PROVIDER_SITE_OTHER): Payer: No Typology Code available for payment source | Admitting: Nurse Practitioner

## 2021-01-11 ENCOUNTER — Other Ambulatory Visit: Payer: Self-pay

## 2021-01-11 VITALS — BP 93/59 | HR 69 | Ht 73.0 in | Wt 179.6 lb

## 2021-01-11 DIAGNOSIS — E1165 Type 2 diabetes mellitus with hyperglycemia: Secondary | ICD-10-CM | POA: Diagnosis not present

## 2021-01-11 NOTE — Progress Notes (Signed)
Endocrinology Consult Note       01/11/2021, 10:38 AM   Subjective:    Patient ID: Grant Cardenas, male    DOB: 1956/01/01.  Grant Cardenas is being seen in consultation for management of currently uncontrolled symptomatic diabetes requested by  Lenise Arena, FNP.   Past Medical History:  Diagnosis Date  . CAD (coronary artery disease)    a. anterior ST elevation MI in 09/2015. Cardiac cath showed occluded prox LAD and 60% ostial RCA stenosis. There was also very distal disease affecting the LAD and ramus. He underwent successful angioplasty and drug-eluting stent placement to the LAD. Ejection fraction was 35-40% which improved subsequently an echo to 55-60%.  . Chronic obstructive pulmonary disease, unspecified ([place])   . COPD (chronic obstructive pulmonary disease) ([place]) 03/2016   4th stage-Dr. Luan Pulling  . Essential (primary) hypertension   . Hyperlipidemia   . Ischemic cardiomyopathy    a. EF by cath 09/13/2015: 35-40%; b. 09/14/2015: EF 55-60%, Probable akinesis of  the midanteroseptal myocardium. akinesis of the apicalanterior myocardium, GR1DD  . MI (myocardial infarction) ([place])   . Psoriasis   . Psoriasis, unspecified   . Tobacco use   . Vitreous floaters of both eyes     Past Surgical History:  Procedure Laterality Date  . CARDIAC CATHETERIZATION N/A 09/13/2015   Procedure: Left Heart Cath and Coronary Angiography;  Surgeon: Wellington Hampshire, MD;  Location: [facility];  Service: Cardiovascular;  Laterality: N/A;  . CARDIAC CATHETERIZATION N/A 09/13/2015   Procedure: Coronary Stent Intervention;  Surgeon: Wellington Hampshire, MD;  Location: [facility];  Service: Cardiovascular;  Laterality: N/A;  . CARDIAC CATHETERIZATION N/A 11/03/2015   Procedure: Left Heart Cath and Coronary Angiography;  Surgeon: Peter M Martinique, MD;  Location: [facility];  Service: Cardiovascular;   Laterality: N/A;  . COLONOSCOPY N/A 01/29/2016   Procedure: COLONOSCOPY;  Surgeon: Danie Binder, MD;  [facility];  Service: Endoscopy;  Laterality: N/A;  1030-moved to 1215 office to notify   . COLONOSCOPY N/A 02/28/2020   Procedure: COLONOSCOPY;  Surgeon: Daneil Dolin, MD;  [facility];  Service: Endoscopy;  Laterality: N/A;  9:30  . ESOPHAGOGASTRODUODENOSCOPY N/A 01/29/2016   Procedure: ESOPHAGOGASTRODUODENOSCOPY (EGD);  Surgeon: Danie Binder, MD;  [facility];  Service: Endoscopy;  Laterality: N/A;  . POLYPECTOMY  02/28/2020   Procedure: POLYPECTOMY;  Surgeon: Daneil Dolin, MD;  [facility];  Service: Endoscopy;;    Social History   Socioeconomic History  . Marital status: Married    Spouse name: Not on file  . Number of children: Not on file  . Years of education: Not on file  . Highest education level: Not on file  Occupational History  . Not on file  Tobacco Use  . Smoking status: Former Smoker    Years: 40.00    Types: Cigarettes    Quit date: 09/14/2015    Years since quitting: 5.3  . Smokeless tobacco: Never Used  Substance and Sexual Activity  . Alcohol use: No    Alcohol/week: 0.0 standard drinks    Comment: Previously heavy beer  drinker; quit 20 years ago  . Drug use: No  . Sexual activity: Never  Other Topics Concern  . Not on file  Social History Narrative  . Not on file   Social Determinants of Health   Financial Resource Strain: Not on file  Food Insecurity: Not on file  Transportation Needs: Not on file  Physical Activity: Not on file  Stress: Not on file  Social Connections: Not on file    Family History  Problem Relation Age of Onset  . Heart Problems Mother   . Heart attack Father   . Heart disease Father   . Cancer Other   . Colon cancer Maternal Aunt     Outpatient Encounter Medications as of 01/11/2021  Medication Sig  . Albuterol Sulfate 108 (90 Base) MCG/ACT AEPB Inhale 1-2  puffs into the lungs 4 (four) times daily as needed (wheezing/shortness of breath.).   Marland Kitchen aspirin EC 81 MG tablet Take 1 tablet (81 mg total) by mouth daily.  Marland Kitchen atorvastatin (LIPITOR) 80 MG tablet Take 40 mg by mouth daily with supper.   . calcium carbonate (OS-CAL) 1250 (500 Ca) MG chewable tablet CHEW 1-2 TABLETS BY MOUTH AT BEDTIME AS NEEDED FOR INDIGESTION  . Carboxymethylcellulose Sod PF 0.25 % SOLN Place 1 drop into both eyes 4 (four) times daily as needed (dry/irritated eyes.).   Marland Kitchen carvedilol (COREG) 3.125 MG tablet Take 1 tablet (3.125 mg total) by mouth 2 (two) times daily with a meal.  . Cholecalciferol 25 MCG (1000 UT) tablet Take 1 tablet by mouth daily.  . clobetasol ointment (TEMOVATE) 5.68 % Apply 1 application topically 2 (two) times daily as needed (psoriasis).   . ezetimibe (ZETIA) 10 MG tablet Take 1 tablet by mouth daily.  . fluticasone (CUTIVATE) 0.05 % cream Apply 1 application topically 2 (two) times daily as needed (psoriasis (face & groin areas)).   Marland Kitchen ketoconazole (NIZORAL) 2 % shampoo Apply 1 application topically 2 (two) times a week. Applied to facial hair (beard) & scalp. Leave on 10 minutes prior to washing out.  Marland Kitchen losartan (COZAAR) 50 MG tablet Take 50 mg by mouth 2 (two) times daily.  . metFORMIN (GLUCOPHAGE) 1000 MG tablet Take 1 tablet by mouth 2 (two) times daily.  . nitroGLYCERIN (NITROSTAT) 0.4 MG SL tablet Place 1 tablet (0.4 mg total) under the tongue every 5 (five) minutes as needed for chest pain.  Marland Kitchen umeclidinium-vilanterol (ANORO ELLIPTA) 62.5-25 MCG/INH AEPB Inhale 1 puff into the lungs daily.  Marland Kitchen Ustekinumab (STELARA Bowie) Inject 1 Dose into the skin every 3 (three) months. Every 12 weeks  . [DISCONTINUED] metFORMIN (GLUCOPHAGE) 500 MG tablet Take 1 tablet (500 mg total) by mouth 2 (two) times daily with a meal.  . [DISCONTINUED] SUPREP BOWEL PREP KIT 17.5-3.13-1.6 GM/177ML SOLN Take 354 mLs by mouth once.   No facility-administered encounter medications on  file as of 01/11/2021.    ALLERGIES: Allergies  Allergen Reactions  . Sulfa Antibiotics Other (See Comments)    Sulfa drugs (unaware of this reaction per patient spouse)      VACCINATION STATUS: Immunization History  Administered Date(s) Administered  . Influenza Inj Mdck Quad Pf 08/11/2016  . Influenza,inj,Quad PF,6+ Mos 06/18/2019    Diabetes He presents for his initial diabetic visit. He has type 2 diabetes mellitus. Onset time: Diagnosed at approx age of 6. His disease course has been improving. There are no hypoglycemic associated symptoms. Associated symptoms include polyuria. There are no hypoglycemic complications. Symptoms are improving. Diabetic  complications include heart disease. Risk factors for coronary artery disease include diabetes mellitus, dyslipidemia, hypertension and male sex. Current diabetic treatment includes oral agent (monotherapy). He is compliant with treatment most of the time. His weight is stable. He is following a generally healthy diet. When asked about meal planning, he reported none. He has not had a previous visit with a dietitian. He participates in exercise intermittently. His home blood glucose trend is decreasing steadily. His breakfast blood glucose range is generally 90-110 mg/dl. (He presents today, accompanied by his wife, with his logs showing drastically improved glycemic profile.  His most recent A1c was 11.5% on 12/03/20.  He did have a short stay in the hospital during that time.  His diabetes has previously been managed with diet successfully but after he caught COVID, he was unable to get it back under control.  He was recently restarted on Metformin 1000 mg po twice daily with meals.  He denies any hypoglycemia.  He does not like checking his fingersticks- has huge fear of needles.  He does routinely monitor glucose once daily (before breakfast) with most recent readings between 80-110.  He admits to drinking gatorade zero, coffee with sweet and  low, and used to skip lunch on most days.  He does not engage in routine physical activity outside of yard work.  He denies any s/s of hypoglycemia.) An ACE inhibitor/angiotensin II receptor blocker is being taken. He does not see a podiatrist.Eye exam is current.  Hyperlipidemia This is a chronic problem. The current episode started more than 1 year ago. The problem is controlled. Recent lipid tests were reviewed and are normal. Exacerbating diseases include diabetes. Factors aggravating his hyperlipidemia include beta blockers. Current antihyperlipidemic treatment includes statins. The current treatment provides significant improvement of lipids. There are no compliance problems.  Risk factors for coronary artery disease include diabetes mellitus, dyslipidemia, hypertension and male sex.  Hypertension This is a chronic problem. The current episode started more than 1 year ago. The problem has been resolved since onset. The problem is controlled. There are no associated agents to hypertension. Risk factors for coronary artery disease include diabetes mellitus, dyslipidemia and male gender. Past treatments include angiotensin blockers and beta blockers. The current treatment provides moderate improvement. There are no compliance problems.  Hypertensive end-organ damage includes CAD/MI.     Review of systems  Constitutional: + Minimally fluctuating body weight,  current Body mass index is 23.7 kg/m. , no fatigue, no subjective hyperthermia, no subjective hypothermia Eyes: no blurry vision, no xerophthalmia ENT: no sore throat, no nodules palpated in throat, no dysphagia/odynophagia, no hoarseness Cardiovascular: no chest pain, no shortness of breath, no palpitations, no leg swelling Respiratory: no cough, no shortness of breath Gastrointestinal: no nausea/vomiting/diarrhea Musculoskeletal: no muscle/joint aches Skin: no rashes, no hyperemia Neurological: no tremors, no numbness, no tingling, no  dizziness Psychiatric: no depression, no anxiety  Objective:     BP (!) 93/59 (BP Location: Right Arm, Patient Position: Sitting)   Pulse 69   Ht '6\' 1"'  (1.854 m)   Wt 179 lb 9.6 oz (81.5 kg)   BMI 23.70 kg/m   Wt Readings from Last 3 Encounters:  01/11/21 179 lb 9.6 oz (81.5 kg)  11/28/20 178 lb (80.7 kg)  07/02/20 191 lb 4 oz (86.8 kg)     BP Readings from Last 3 Encounters:  01/11/21 (!) 93/59  11/28/20 (!) 112/91  07/02/20 116/60     Physical Exam- Limited  Constitutional:  Body mass index is  23.7 kg/m. , not in acute distress, slightly anxious state of mind Eyes:  EOMI, no exophthalmos Neck: Supple Cardiovascular: RRR, no murmers, rubs, or gallops, no edema Respiratory: Adequate breathing efforts, no crackles, rales, rhonchi, or wheezing Musculoskeletal: no gross deformities, strength intact in all four extremities, no gross restriction of joint movements Skin:  no rashes, no hyperemia Neurological: no tremor with outstretched hands    CMP ( most recent) CMP     Component Value Date/Time   NA 130 (L) 11/28/2020 1328   NA 140 11/08/2018 0000   K 4.5 11/28/2020 1328   CL 99 11/28/2020 1328   CO2 19 (L) 11/28/2020 1328   GLUCOSE 448 (H) 11/28/2020 1328   BUN 21 11/28/2020 1328   BUN 14 11/08/2018 0000   CREATININE 1.3 12/03/2020 0000   CREATININE 1.14 11/28/2020 1328   CALCIUM 9.3 11/28/2020 1328   PROT 7.7 11/28/2020 1328   PROT 7.2 09/23/2015 1540   ALBUMIN 4.4 11/28/2020 1328   ALBUMIN 4.4 09/23/2015 1540   AST 24 12/03/2020 0000   ALT 69 (A) 12/03/2020 0000   ALKPHOS 101 11/28/2020 1328   BILITOT 2.7 (H) 11/28/2020 1328   BILITOT 0.9 09/23/2015 1540   GFRNONAA >60 11/28/2020 1328   GFRAA 82 11/08/2018 0000     Diabetic Labs (most recent): Lab Results  Component Value Date   HGBA1C 11.5 12/03/2020   HGBA1C 6.6 11/08/2018     Lipid Panel ( most recent) Lipid Panel     Component Value Date/Time   CHOL 86 12/03/2020 0000   CHOL 212 (H)  09/23/2015 1540   TRIG 115 12/03/2020 0000   HDL 43 11/08/2018 0000   HDL 41 09/23/2015 1540   CHOLHDL 3.7 11/26/2015 0815   VLDL 17 11/26/2015 0815   LDLCALC 26 12/03/2020 0000   LDLCALC 137 (H) 09/23/2015 1540   LABVLDL 34 09/23/2015 1540      Lab Results  Component Value Date   TSH 2.61 11/08/2018           Assessment & Plan:   1) Uncontrolled Type 2 Diabetes without complications  He presents today, accompanied by his wife, with his logs showing drastically improved glycemic profile.  His most recent A1c was 11.5% on 12/03/20.  He did have a short stay in the hospital during that time.  His diabetes has previously been managed with diet successfully but after he caught COVID, he was unable to get it back under control.  He was recently restarted on Metformin 1000 mg po twice daily with meals.  He denies any hypoglycemia.  He does not like checking his fingersticks- has huge fear of needles.  He does routinely monitor glucose once daily (before breakfast) with most recent readings between 80-110.  He admits to drinking gatorade zero, coffee with sweet and low, and used to skip lunch on most days.  He does not engage in routine physical activity outside of yard work.  He denies any s/s of hypoglycemia.  - Grant Cardenas has currently uncontrolled symptomatic type 2 DM since 65 years of age, with most recent A1c of 11.5 %.   -Recent labs reviewed.  - I had a long discussion with him about the progressive nature of diabetes and the pathology behind its complications. -his diabetes is complicated by CAD (history of MI) and he remains at a high risk for more acute and chronic complications which include CVA, CKD, retinopathy, and neuropathy. These are all discussed in detail with him.  -  I have counseled him on diet and weight management by adopting a carbohydrate restricted/protein rich diet. Patient is encouraged to switch to unprocessed or minimally processed complex starch and  increased protein intake (animal or plant source), fruits, and vegetables. -  he is advised to stick to a routine mealtimes to eat 3 meals a day and avoid unnecessary snacks (to snack only to correct hypoglycemia).   - he acknowledges that there is a room for improvement in his food and drink choices. - Suggestion is made for him to avoid simple carbohydrates  from his diet including Cakes, Sweet Desserts, Ice Cream, Soda (diet and regular), Sweet Tea, Candies, Chips, Cookies, Store Bought Juices, Alcohol in Excess of  1-2 drinks a day, Artificial Sweeteners, Coffee Creamer, and "Sugar-free" Products. This will help patient to have more stable blood glucose profile and potentially avoid unintended weight gain.  - I have approached him with the following individualized plan to manage  his diabetes and patient agrees:   -he is encouraged to start monitoring glucose twice daily, before breakfast and before bed, to log their readings on the clinic sheets provided, and bring them to review at follow up appointment in 2 weeks.  - Adjustment parameters are given to him for hypo and hyperglycemia in writing. - he is encouraged to call clinic for blood glucose levels less than 70 or above 300 mg /dl. - he is advised to continue Metformin 1000 mg po twice daily with meals, therapeutically suitable for patient .  - he will be considered for incretin therapy as appropriate next visit.  - Specific targets for  A1c;  LDL, HDL,  and Triglycerides were discussed with the patient.  2) Blood Pressure /Hypertension:  his blood pressure is controlled to target.   he is advised to continue his current medications including Carvedilol 3.125 mg po twice daily and Losartan 50 mg p.o. daily with breakfast.  3) Lipids/Hyperlipidemia:    Review of his recent lipid panel from 12/03/20 showed controlled  LDL at 26 .  he  is advised to continue Lipitor 80 mg po daily at bedtime and Zetia 10 mg daily at bedtime.  Side effects  and precautions discussed with him.  4)  Weight/Diet:  his Body mass index is 23.7 kg/m.  -  he is not a candidate for weight loss. Exercise, and detailed carbohydrates information provided  -  detailed on discharge instructions.  5) Chronic Care/Health Maintenance: -he is on ACEI/ARB and Statin medications and is encouraged to initiate and continue to follow up with Ophthalmology, Dentist, Podiatrist at least yearly or according to recommendations, and advised to stay away from smoking. I have recommended yearly flu vaccine and pneumonia vaccine at least every 5 years; moderate intensity exercise for up to 150 minutes weekly; and sleep for at least 7 hours a day.  - he is advised to maintain close follow up with Lenise Arena, FNP for primary care needs, as well as his other providers for optimal and coordinated care.   - Time spent in this patient care: 60 min, of which > 50% was spent in counseling him about his diabetes and the rest reviewing his blood glucose logs, discussing his hypoglycemia and hyperglycemia episodes, reviewing his current and previous labs/studies (including abstraction from other facilities) and medications doses and developing a long term treatment plan based on the latest standards of care/guidelines; and documenting his care.    Please refer to Patient Instructions for Blood Glucose Monitoring and Insulin/Medications Dosing  Guide" in media tab for additional information. Please also refer to "Patient Self Inventory" in the Media tab for reviewed elements of pertinent patient history.  Grant Cardenas participated in the discussions, expressed understanding, and voiced agreement with the above plans.  All questions were answered to his satisfaction. he is encouraged to contact clinic should he have any questions or concerns prior to his return visit.   Follow up plan: - Return in about 2 weeks (around 01/25/2021) for Diabetes follow up, Bring glucometer and logs,  ABI next visit.  Rayetta Pigg, [facility] [address] [place], [place] [address] Phone: [phone] Fax: [phone]  01/11/2021, 10:38 AM

## 2021-01-11 NOTE — Patient Instructions (Signed)
Diabetes Mellitus and Nutrition, Adult When you have diabetes, or diabetes mellitus, it is very important to have healthy eating habits because your blood sugar (glucose) levels are greatly affected by what you eat and drink. Eating healthy foods in the right amounts, at about the same times every day, can help you:  Control your blood glucose.  Lower your risk of heart disease.  Improve your blood pressure.  Reach or maintain a healthy weight. What can affect my meal plan? Every person with diabetes is different, and each person has different needs for a meal plan. Your health care provider may recommend that you work with a dietitian to make a meal plan that is best for you. Your meal plan may vary depending on factors such as:  The calories you need.  The medicines you take.  Your weight.  Your blood glucose, blood pressure, and cholesterol levels.  Your activity level.  Other health conditions you have, such as heart or kidney disease. How do carbohydrates affect me? Carbohydrates, also called carbs, affect your blood glucose level more than any other type of food. Eating carbs naturally raises the amount of glucose in your blood. Carb counting is a method for keeping track of how many carbs you eat. Counting carbs is important to keep your blood glucose at a healthy level, especially if you use insulin or take certain oral diabetes medicines. It is important to know how many carbs you can safely have in each meal. This is different for every person. Your dietitian can help you calculate how many carbs you should have at each meal and for each snack. How does alcohol affect me? Alcohol can cause a sudden decrease in blood glucose (hypoglycemia), especially if you use insulin or take certain oral diabetes medicines. Hypoglycemia can be a life-threatening condition. Symptoms of hypoglycemia, such as sleepiness, dizziness, and confusion, are similar to symptoms of having too much  alcohol.  Do not drink alcohol if: ? Your health care provider tells you not to drink. ? You are pregnant, may be pregnant, or are planning to become pregnant.  If you drink alcohol: ? Do not drink on an empty stomach. ? Limit how much you use to:  0-1 drink a day for women.  0-2 drinks a day for men. ? Be aware of how much alcohol is in your drink. In the [place] one drink equals one 12 oz bottle of beer (355 mL), one 5 oz glass of wine (148 mL), or one 1 oz glass of hard liquor (44 mL). ? Keep yourself hydrated with water, diet soda, or unsweetened iced tea.  Keep in mind that regular soda, juice, and other mixers may contain a lot of sugar and must be counted as carbs. What are tips for following this plan? Reading food labels  Start by checking the serving size on the "Nutrition Facts" label of packaged foods and drinks. The amount of calories, carbs, fats, and other nutrients listed on the label is based on one serving of the item. Many items contain more than one serving per package.  Check the total grams (g) of carbs in one serving. You can calculate the number of servings of carbs in one serving by dividing the total carbs by 15. For example, if a food has 30 g of total carbs per serving, it would be equal to 2 servings of carbs.  Check the number of grams (g) of saturated fats and trans fats in one serving. Choose foods that have   a low amount or none of these fats.  Check the number of milligrams (mg) of salt (sodium) in one serving. Most people should limit total sodium intake to less than 2,300 mg per day.  Always check the nutrition information of foods labeled as "low-fat" or "nonfat." These foods may be higher in added sugar or refined carbs and should be avoided.  Talk to your dietitian to identify your daily goals for nutrients listed on the label. Shopping  Avoid buying canned, pre-made, or processed foods. These foods tend to be high in fat, sodium, and added  sugar.  Shop around the outside edge of the grocery store. This is where you will most often find fresh fruits and vegetables, bulk grains, fresh meats, and fresh dairy. Cooking  Use low-heat cooking methods, such as baking, instead of high-heat cooking methods like deep frying.  Cook using healthy oils, such as olive, canola, or sunflower oil.  Avoid cooking with butter, cream, or high-fat meats. Meal planning  Eat meals and snacks regularly, preferably at the same times every day. Avoid going long periods of time without eating.  Eat foods that are high in fiber, such as fresh fruits, vegetables, beans, and whole grains. Talk with your dietitian about how many servings of carbs you can eat at each meal.  Eat 4-6 oz (112-168 g) of lean protein each day, such as lean meat, chicken, fish, eggs, or tofu. One ounce (oz) of lean protein is equal to: ? 1 oz (28 g) of meat, chicken, or fish. ? 1 egg. ?  cup (62 g) of tofu.  Eat some foods each day that contain healthy fats, such as avocado, nuts, seeds, and fish.   What foods should I eat? Fruits Berries. Apples. Oranges. Peaches. Apricots. Plums. Grapes. Mango. Papaya. Pomegranate. Kiwi. Cherries. Vegetables Lettuce. Spinach. Leafy greens, including kale, chard, collard greens, and mustard greens. Beets. Cauliflower. Cabbage. Broccoli. Carrots. Green beans. Tomatoes. Peppers. Onions. Cucumbers. Brussels sprouts. Grains Whole grains, such as whole-wheat or whole-grain bread, crackers, tortillas, cereal, and pasta. Unsweetened oatmeal. Quinoa. Brown or wild rice. Meats and other proteins Seafood. Poultry without skin. Lean cuts of poultry and beef. Tofu. Nuts. Seeds. Dairy Low-fat or fat-free dairy products such as milk, yogurt, and cheese. The items listed above may not be a complete list of foods and beverages you can eat. Contact a dietitian for more information. What foods should I avoid? Fruits Fruits canned with  syrup. Vegetables Canned vegetables. Frozen vegetables with butter or cream sauce. Grains Refined white flour and flour products such as bread, pasta, snack foods, and cereals. Avoid all processed foods. Meats and other proteins Fatty cuts of meat. Poultry with skin. Breaded or fried meats. Processed meat. Avoid saturated fats. Dairy Full-fat yogurt, cheese, or milk. Beverages Sweetened drinks, such as soda or iced tea. The items listed above may not be a complete list of foods and beverages you should avoid. Contact a dietitian for more information. Questions to ask a health care provider  Do I need to meet with a diabetes educator?  Do I need to meet with a dietitian?  What number can I call if I have questions?  When are the best times to check my blood glucose? Where to find more information:  American Diabetes Association: diabetes.org  Academy of Nutrition and Dietetics: www.eatright.org  National Institute of Diabetes and Digestive and Kidney Diseases: www.niddk.nih.gov  Association of Diabetes Care and Education Specialists: www.diabeteseducator.org Summary  It is important to have healthy eating   habits because your blood sugar (glucose) levels are greatly affected by what you eat and drink.  A healthy meal plan will help you control your blood glucose and maintain a healthy lifestyle.  Your health care provider may recommend that you work with a dietitian to make a meal plan that is best for you.  Keep in mind that carbohydrates (carbs) and alcohol have immediate effects on your blood glucose levels. It is important to count carbs and to use alcohol carefully. This information is not intended to replace advice given to you by your health care provider. Make sure you discuss any questions you have with your health care provider. Document Revised: 08/27/2019 Document Reviewed: 08/27/2019 Elsevier Patient Education  2021 Elsevier Inc.  

## 2021-01-28 ENCOUNTER — Other Ambulatory Visit: Payer: Self-pay

## 2021-01-28 ENCOUNTER — Encounter: Payer: Self-pay | Admitting: Nurse Practitioner

## 2021-01-28 ENCOUNTER — Ambulatory Visit (INDEPENDENT_AMBULATORY_CARE_PROVIDER_SITE_OTHER): Payer: No Typology Code available for payment source | Admitting: Nurse Practitioner

## 2021-01-28 VITALS — BP 124/75 | HR 63 | Ht 73.0 in | Wt 172.8 lb

## 2021-01-28 DIAGNOSIS — E1165 Type 2 diabetes mellitus with hyperglycemia: Secondary | ICD-10-CM | POA: Diagnosis not present

## 2021-01-28 NOTE — Patient Instructions (Signed)

## 2021-01-28 NOTE — Progress Notes (Signed)
Endocrinology Consult Note       01/28/2021, 11:23 AM   Subjective:    Patient ID: Grant Cardenas, male    DOB: 1956-03-26.  [name] is being seen in consultation for management of currently uncontrolled symptomatic diabetes requested by  Lenise Arena, FNP.   Past Medical History:  Diagnosis Date  . CAD (coronary artery disease)    a. anterior ST elevation MI in 09/2015. Cardiac cath showed occluded prox LAD and 60% ostial RCA stenosis. There was also very distal disease affecting the LAD and ramus. He underwent successful angioplasty and drug-eluting stent placement to the LAD. Ejection fraction was 35-40% which improved subsequently an echo to 55-60%.  . Chronic obstructive pulmonary disease, unspecified ([place])   . COPD (chronic obstructive pulmonary disease) ([place]) 03/2016   4th stage-Dr. Luan Pulling  . Essential (primary) hypertension   . Hyperlipidemia   . Ischemic cardiomyopathy    a. EF by cath 09/13/2015: 35-40%; b. 09/14/2015: EF 55-60%, Probable akinesis of  the midanteroseptal myocardium. akinesis of the apicalanterior myocardium, GR1DD  . MI (myocardial infarction) ([place])   . Psoriasis   . Psoriasis, unspecified   . Tobacco use   . Vitreous floaters of both eyes     Past Surgical History:  Procedure Laterality Date  . CARDIAC CATHETERIZATION N/A 09/13/2015   Procedure: Left Heart Cath and Coronary Angiography;  Surgeon: Wellington Hampshire, MD;  Location: [facility];  Service: Cardiovascular;  Laterality: N/A;  . CARDIAC CATHETERIZATION N/A 09/13/2015   Procedure: Coronary Stent Intervention;  Surgeon: Wellington Hampshire, MD;  Location: [facility];  Service: Cardiovascular;  Laterality: N/A;  . CARDIAC CATHETERIZATION N/A 11/03/2015   Procedure: Left Heart Cath and Coronary Angiography;  Surgeon: Peter M Martinique, MD;  Location: [facility];  Service: Cardiovascular;   Laterality: N/A;  . COLONOSCOPY N/A 01/29/2016   Procedure: COLONOSCOPY;  Surgeon: Danie Binder, MD;  [facility];  Service: Endoscopy;  Laterality: N/A;  1030-moved to 1215 office to notify   . COLONOSCOPY N/A 02/28/2020   Procedure: COLONOSCOPY;  Surgeon: Daneil Dolin, MD;  [facility];  Service: Endoscopy;  Laterality: N/A;  9:30  . ESOPHAGOGASTRODUODENOSCOPY N/A 01/29/2016   Procedure: ESOPHAGOGASTRODUODENOSCOPY (EGD);  Surgeon: Danie Binder, MD;  [facility];  Service: Endoscopy;  Laterality: N/A;  . POLYPECTOMY  02/28/2020   Procedure: POLYPECTOMY;  Surgeon: Daneil Dolin, MD;  [facility];  Service: Endoscopy;;    Social History   Socioeconomic History  . Marital status: Married    Spouse name: Not on file  . Number of children: Not on file  . Years of education: Not on file  . Highest education level: Not on file  Occupational History  . Not on file  Tobacco Use  . Smoking status: Former Smoker    Years: 40.00    Types: Cigarettes    Quit date: 09/14/2015    Years since quitting: 5.3  . Smokeless tobacco: Never Used  Substance and Sexual Activity  . Alcohol use: No    Alcohol/week: 0.0 standard drinks    Comment: Previously heavy beer  drinker; quit 20 years ago  . Drug use: No  . Sexual activity: Never  Other Topics Concern  . Not on file  Social History Narrative  . Not on file   Social Determinants of Health   Financial Resource Strain: Not on file  Food Insecurity: Not on file  Transportation Needs: Not on file  Physical Activity: Not on file  Stress: Not on file  Social Connections: Not on file    Family History  Problem Relation Age of Onset  . Heart Problems Mother   . Heart attack Father   . Heart disease Father   . Cancer Other   . Colon cancer Maternal Aunt     Outpatient Encounter Medications as of 01/28/2021  Medication Sig  . Albuterol Sulfate 108 (90 Base) MCG/ACT AEPB Inhale 1-2  puffs into the lungs 4 (four) times daily as needed (wheezing/shortness of breath.).   Marland Kitchen aspirin EC 81 MG tablet Take 1 tablet (81 mg total) by mouth daily.  Marland Kitchen atorvastatin (LIPITOR) 80 MG tablet Take 40 mg by mouth daily with supper.   . calcium carbonate (OS-CAL) 1250 (500 Ca) MG chewable tablet CHEW 1-2 TABLETS BY MOUTH AT BEDTIME AS NEEDED FOR INDIGESTION  . Carboxymethylcellulose Sod PF 0.25 % SOLN Place 1 drop into both eyes 4 (four) times daily as needed (dry/irritated eyes.).   Marland Kitchen carvedilol (COREG) 3.125 MG tablet Take 1 tablet (3.125 mg total) by mouth 2 (two) times daily with a meal.  . Cholecalciferol 25 MCG (1000 UT) tablet Take 1 tablet by mouth daily.  . clobetasol ointment (TEMOVATE) 2.59 % Apply 1 application topically 2 (two) times daily as needed (psoriasis).   . ezetimibe (ZETIA) 10 MG tablet Take 1 tablet by mouth daily.  . fluticasone (CUTIVATE) 0.05 % cream Apply 1 application topically 2 (two) times daily as needed (psoriasis (face & groin areas)).   Marland Kitchen ketoconazole (NIZORAL) 2 % shampoo Apply 1 application topically 2 (two) times a week. Applied to facial hair (beard) & scalp. Leave on 10 minutes prior to washing out.  Marland Kitchen losartan (COZAAR) 50 MG tablet Take 50 mg by mouth 2 (two) times daily.  . metFORMIN (GLUCOPHAGE) 1000 MG tablet Take 1 tablet by mouth 2 (two) times daily.  . nitroGLYCERIN (NITROSTAT) 0.4 MG SL tablet Place 1 tablet (0.4 mg total) under the tongue every 5 (five) minutes as needed for chest pain.  Marland Kitchen umeclidinium-vilanterol (ANORO ELLIPTA) 62.5-25 MCG/INH AEPB Inhale 1 puff into the lungs daily.  Marland Kitchen Ustekinumab (STELARA [place]) Inject 1 Dose into the skin every 3 (three) months. Every 12 weeks   No facility-administered encounter medications on file as of 01/28/2021.    ALLERGIES: Allergies  Allergen Reactions  . Sulfa Antibiotics Other (See Comments)    Sulfa drugs (unaware of this reaction per patient spouse)      VACCINATION STATUS: Immunization  History  Administered Date(s) Administered  . Influenza Inj Mdck Quad Pf 08/11/2016  . Influenza,inj,Quad PF,6+ Mos 06/18/2019    Diabetes He presents for his follow-up diabetic visit. He has type 2 diabetes mellitus. Onset time: Diagnosed at approx age of 60. His disease course has been improving. There are no hypoglycemic associated symptoms. Pertinent negatives for diabetes include no fatigue and no polyuria. There are no hypoglycemic complications. Symptoms are improving. Diabetic complications include heart disease. Risk factors for coronary artery disease include diabetes mellitus, dyslipidemia, hypertension and male sex. Current diabetic treatment includes oral agent (monotherapy). He is compliant with treatment most of the  time. His weight is decreasing steadily. He is following a generally healthy diet. When asked about meal planning, he reported none. He has not had a previous visit with a dietitian. He participates in exercise intermittently. His home blood glucose trend is decreasing steadily. His breakfast blood glucose range is generally 90-110 mg/dl. His overall blood glucose range is 90-110 mg/dl. (He presents today, accompanied by his wife, with his meter and logs showing at goal fasting glycemic profile.  He has worked hard on diet and exercise, and although he is very afraid of needles, he did monitor glucose as requested.) An ACE inhibitor/angiotensin II receptor blocker is being taken. He does not see a podiatrist.Eye exam is current.  Hyperlipidemia This is a chronic problem. The current episode started more than 1 year ago. The problem is controlled. Recent lipid tests were reviewed and are normal. Exacerbating diseases include diabetes. Factors aggravating his hyperlipidemia include beta blockers. Current antihyperlipidemic treatment includes statins. The current treatment provides significant improvement of lipids. There are no compliance problems.  Risk factors for coronary artery  disease include diabetes mellitus, dyslipidemia, hypertension and male sex.  Hypertension This is a chronic problem. The current episode started more than 1 year ago. The problem has been resolved since onset. The problem is controlled. There are no associated agents to hypertension. Risk factors for coronary artery disease include diabetes mellitus, dyslipidemia and male gender. Past treatments include angiotensin blockers and beta blockers. The current treatment provides moderate improvement. There are no compliance problems.  Hypertensive end-organ damage includes CAD/MI.     Review of systems  Constitutional: + Minimally fluctuating body weight,  current Body mass index is 22.8 kg/m. , no fatigue, no subjective hyperthermia, no subjective hypothermia Eyes: no blurry vision, no xerophthalmia ENT: no sore throat, no nodules palpated in throat, no dysphagia/odynophagia, no hoarseness Cardiovascular: no chest pain, no shortness of breath, no palpitations, no leg swelling Respiratory: no cough, no shortness of breath Gastrointestinal: no nausea/vomiting/diarrhea Musculoskeletal: no muscle/joint aches Skin: no rashes, no hyperemia Neurological: no tremors, no numbness, no tingling, no dizziness Psychiatric: no depression, no anxiety  Objective:     BP 124/75 (BP Location: Right Arm, Patient Position: Sitting)   Pulse 63   Ht 6\' 1"  (1.854 m)   Wt 172 lb 12.8 oz (78.4 kg)   BMI 22.80 kg/m   Wt Readings from Last 3 Encounters:  01/28/21 172 lb 12.8 oz (78.4 kg)  01/11/21 179 lb 9.6 oz (81.5 kg)  11/28/20 178 lb (80.7 kg)     BP Readings from Last 3 Encounters:  01/28/21 124/75  01/11/21 (!) 93/59  11/28/20 (!) 112/91     Physical Exam- Limited  Constitutional:  Body mass index is 22.8 kg/m. , not in acute distress, slightly anxious state of mind Eyes:  EOMI, no exophthalmos Neck: Supple Cardiovascular: RRR, no murmers, rubs, or gallops, no edema Respiratory: Adequate  breathing efforts, no crackles, rales, rhonchi, or wheezing Musculoskeletal: no gross deformities, strength intact in all four extremities, no gross restriction of joint movements Skin:  no rashes, no hyperemia Neurological: no tremor with outstretched hands    CMP ( most recent) CMP     Component Value Date/Time   NA 130 (L) 11/28/2020 1328   NA 140 11/08/2018 0000   K 4.5 11/28/2020 1328   CL 99 11/28/2020 1328   CO2 19 (L) 11/28/2020 1328   GLUCOSE 448 (H) 11/28/2020 1328   BUN 21 11/28/2020 1328   BUN 14 11/08/2018 0000  CREATININE 1.3 12/03/2020 0000   CREATININE 1.14 11/28/2020 1328   CALCIUM 9.3 11/28/2020 1328   PROT 7.7 11/28/2020 1328   PROT 7.2 09/23/2015 1540   ALBUMIN 4.4 11/28/2020 1328   ALBUMIN 4.4 09/23/2015 1540   AST 24 12/03/2020 0000   ALT 69 (A) 12/03/2020 0000   ALKPHOS 101 11/28/2020 1328   BILITOT 2.7 (H) 11/28/2020 1328   BILITOT 0.9 09/23/2015 1540   GFRNONAA >60 11/28/2020 1328   GFRAA 82 11/08/2018 0000     Diabetic Labs (most recent): Lab Results  Component Value Date   HGBA1C 11.5 12/03/2020   HGBA1C 6.6 11/08/2018     Lipid Panel ( most recent) Lipid Panel     Component Value Date/Time   CHOL 86 12/03/2020 0000   CHOL 212 (H) 09/23/2015 1540   TRIG 115 12/03/2020 0000   HDL 43 11/08/2018 0000   HDL 41 09/23/2015 1540   CHOLHDL 3.7 11/26/2015 0815   VLDL 17 11/26/2015 0815   LDLCALC 26 12/03/2020 0000   LDLCALC 137 (H) 09/23/2015 1540   LABVLDL 34 09/23/2015 1540      Lab Results  Component Value Date   TSH 2.61 11/08/2018           Assessment & Plan:   1) Uncontrolled Type 2 Diabetes without complications  He presents today, accompanied by his wife, with his meter and logs showing at goal fasting glycemic profile.  He has worked hard on diet and exercise, and although he is very afraid of needles, he did monitor glucose as requested.  - [name] has currently uncontrolled symptomatic type 2 DM since  65 years of age, with most recent A1c of 11.5 %.   -Recent labs reviewed.  - I had a long discussion with him about the progressive nature of diabetes and the pathology behind its complications. -his diabetes is complicated by CAD (history of MI) and he remains at a high risk for more acute and chronic complications which include CVA, CKD, retinopathy, and neuropathy. These are all discussed in detail with him.  - Nutritional counseling repeated at each appointment due to patients tendency to fall back in to old habits.  - The patient admits there is a room for improvement in their diet and drink choices. -  Suggestion is made for the patient to avoid simple carbohydrates from their diet including Cakes, Sweet Desserts / Pastries, Ice Cream, Soda (diet and regular), Sweet Tea, Candies, Chips, Cookies, Sweet Pastries,  Store Bought Juices, Alcohol in Excess of  1-2 drinks a day, Artificial Sweeteners, Coffee Creamer, and "Sugar-free" Products. This will help patient to have stable blood glucose profile and potentially avoid unintended weight gain.   - I encouraged the patient to switch to  unprocessed or minimally processed complex starch and increased protein intake (animal or plant source), fruits, and vegetables.   - Patient is advised to stick to a routine mealtimes to eat 3 meals  a day and avoid unnecessary snacks ( to snack only to correct hypoglycemia).  - I have approached him with the following individualized plan to manage  his diabetes and patient agrees:   -Given his stable glycemic profile with at goal fasting profile, he is advised to continue Metformin 1000 mg po twice daily with meals, therapeutically suitable for patient.  He can take a break from monitoring blood glucose at this time.  - he will be considered for incretin therapy as appropriate next visit.  - Specific targets for  A1c;  LDL, HDL,  and Triglycerides were discussed with the patient.  2) Blood Pressure  /Hypertension:  his blood pressure is controlled to target.   he is advised to continue his current medications including Carvedilol 3.125 mg po twice daily and Losartan 50 mg p.o. daily with breakfast.  3) Lipids/Hyperlipidemia:    Review of his recent lipid panel from 12/03/20 showed controlled  LDL at 26 .  he  is advised to continue Lipitor 80 mg po daily at bedtime and Zetia 10 mg daily at bedtime.  Side effects and precautions discussed with him.  4)  Weight/Diet:  his Body mass index is 22.8 kg/m.  -  he is not a candidate for weight loss. Exercise, and detailed carbohydrates information provided  -  detailed on discharge instructions.  5) Chronic Care/Health Maintenance: -he is on ACEI/ARB and Statin medications and is encouraged to initiate and continue to follow up with Ophthalmology, Dentist, Podiatrist at least yearly or according to recommendations, and advised to stay away from smoking. I have recommended yearly flu vaccine and pneumonia vaccine at least every 5 years; moderate intensity exercise for up to 150 minutes weekly; and sleep for at least 7 hours a day.  - he is advised to maintain close follow up with Lenise Arena, FNP for primary care needs, as well as his other providers for optimal and coordinated care.     I spent 30 minutes in the care of the patient today including review of labs from [facility], Lipids, Thyroid Function, Hematology (current and previous including abstractions from other facilities); face-to-face time discussing  his blood glucose readings/logs, discussing hypoglycemia and hyperglycemia episodes and symptoms, medications doses, his options of short and long term treatment based on the latest standards of care / guidelines;  discussion about incorporating lifestyle medicine;  and documenting the encounter.    Please refer to Patient Instructions for Blood Glucose Monitoring and Insulin/Medications Dosing Guide"  in media tab for additional information.  Please  also refer to " Patient Self Inventory" in the Media  tab for reviewed elements of pertinent patient history.  [name] participated in the discussions, expressed understanding, and voiced agreement with the above plans.  All questions were answered to his satisfaction. he is encouraged to contact clinic should he have any questions or concerns prior to his return visit.   Follow up plan: - Return in about 3 months (around 04/29/2021) for Diabetes F/U, Previsit labs.  Rayetta Pigg, [facility] [address] [place], [place] [address] Phone: [phone] Fax: [phone]  01/28/2021, 11:23 AM

## 2021-02-18 ENCOUNTER — Ambulatory Visit (INDEPENDENT_AMBULATORY_CARE_PROVIDER_SITE_OTHER): Payer: Medicare HMO | Admitting: Internal Medicine

## 2021-02-18 ENCOUNTER — Other Ambulatory Visit: Payer: Self-pay

## 2021-02-18 ENCOUNTER — Encounter: Payer: Self-pay | Admitting: Internal Medicine

## 2021-02-18 VITALS — BP 130/74 | HR 65 | Temp 97.8°F | Resp 18 | Ht 73.0 in | Wt 172.1 lb

## 2021-02-18 DIAGNOSIS — L409 Psoriasis, unspecified: Secondary | ICD-10-CM | POA: Diagnosis not present

## 2021-02-18 DIAGNOSIS — Z7689 Persons encountering health services in other specified circumstances: Secondary | ICD-10-CM | POA: Diagnosis not present

## 2021-02-18 DIAGNOSIS — J449 Chronic obstructive pulmonary disease, unspecified: Secondary | ICD-10-CM

## 2021-02-18 DIAGNOSIS — K05329 Chronic periodontitis, generalized, unspecified severity: Secondary | ICD-10-CM | POA: Insufficient documentation

## 2021-02-18 DIAGNOSIS — I25119 Atherosclerotic heart disease of native coronary artery with unspecified angina pectoris: Secondary | ICD-10-CM

## 2021-02-18 DIAGNOSIS — I255 Ischemic cardiomyopathy: Secondary | ICD-10-CM | POA: Diagnosis not present

## 2021-02-18 DIAGNOSIS — E119 Type 2 diabetes mellitus without complications: Secondary | ICD-10-CM

## 2021-02-18 DIAGNOSIS — I2510 Atherosclerotic heart disease of native coronary artery without angina pectoris: Secondary | ICD-10-CM | POA: Insufficient documentation

## 2021-02-18 DIAGNOSIS — J929 Pleural plaque without asbestos: Secondary | ICD-10-CM | POA: Insufficient documentation

## 2021-02-18 DIAGNOSIS — Z1159 Encounter for screening for other viral diseases: Secondary | ICD-10-CM

## 2021-02-18 DIAGNOSIS — I10 Essential (primary) hypertension: Secondary | ICD-10-CM | POA: Diagnosis not present

## 2021-02-18 DIAGNOSIS — R911 Solitary pulmonary nodule: Secondary | ICD-10-CM | POA: Insufficient documentation

## 2021-02-18 DIAGNOSIS — H2513 Age-related nuclear cataract, bilateral: Secondary | ICD-10-CM | POA: Insufficient documentation

## 2021-02-18 DIAGNOSIS — F419 Anxiety disorder, unspecified: Secondary | ICD-10-CM | POA: Insufficient documentation

## 2021-02-18 NOTE — Patient Instructions (Addendum)
Please continue taking medications as prescribed.  Please continue to follow DASH diet and ambulate as tolerated.  Please continue to follow up with Cardiologist and Endocrinologist as scheduled.  Please ask your [facility] about Ultrasound of abdomen for aortic aneurysm screening.  Thank you for choosing [facility]! We consider it our privilege to take care of you!

## 2021-02-19 NOTE — Progress Notes (Signed)
New Patient Office Visit  Subjective:  Patient ID: Grant Cardenas, male    DOB: [birth year]  Age: 65 y.o. MRN: [id number]  CC:  Chief Complaint  Patient presents with  . New Patient (Initial Visit)    New patient just establishing care     HPI Grant Cardenas is a 65 year old male with PMH of CAD s/p stent placement in 2016, HTN, COPD, type 2 DM, psoriasis and HLD who presents for establishing care. He also sees [place].  He follows up with Cardiologist in [place] for his h/o CAD. Denies any chest pain or dyspnea currently.  BP is well-controlled. Takes medications regularly. Patient denies headache, dizziness, chest pain, dyspnea or palpitations.  He uses inhaler regularly and COPD is well-controlled. Quit smoking in 2016.  He is on Stelara for Psoriasis. He follows up with Dermatology.  He is up-to-date with COVID vaccine.   Past Medical History:  Diagnosis Date  . CAD (coronary artery disease)    a. anterior ST elevation MI in 09/2015. Cardiac cath showed occluded prox LAD and 60% ostial RCA stenosis. There was also very distal disease affecting the LAD and ramus. He underwent successful angioplasty and drug-eluting stent placement to the LAD. Ejection fraction was 35-40% which improved subsequently an echo to 55-60%.  . Chronic obstructive pulmonary disease, unspecified ([place])   . COPD (chronic obstructive pulmonary disease) ([place]) 03/2016   4th stage-Dr. Luan Pulling  . Essential (primary) hypertension   . Hyperlipidemia   . Ischemic cardiomyopathy    a. EF by cath 09/13/2015: 35-40%; b. 09/14/2015: EF 55-60%, Probable akinesis of  the midanteroseptal myocardium. akinesis of the apicalanterior myocardium, GR1DD  . MI (myocardial infarction) ([place])   . Psoriasis   . Psoriasis, unspecified   . ST elevation (STEMI) myocardial infarction involving left anterior descending coronary artery ([place])  09/2015 09/13/2015  . Tobacco use   . Vitreous floaters of both eyes     Past  Surgical History:  Procedure Laterality Date  . CARDIAC CATHETERIZATION N/A 09/13/2015   Procedure: Left Heart Cath and Coronary Angiography;  Surgeon: Wellington Hampshire, MD;  Location: [place] [facility];  Service: Cardiovascular;  Laterality: N/A;  . CARDIAC CATHETERIZATION N/A 09/13/2015   Procedure: Coronary Stent Intervention;  Surgeon: Wellington Hampshire, MD;  Location: [place] [facility];  Service: Cardiovascular;  Laterality: N/A;  . CARDIAC CATHETERIZATION N/A 11/03/2015   Procedure: Left Heart Cath and Coronary Angiography;  Surgeon: Peter M Martinique, MD;  Location: [place] [facility];  Service: Cardiovascular;  Laterality: N/A;  . COLONOSCOPY N/A 01/29/2016   Procedure: COLONOSCOPY;  Surgeon: Danie Binder, MD;  [facility];  Service: Endoscopy;  Laterality: N/A;  1030-moved to 1215 office to notify   . COLONOSCOPY N/A 02/28/2020   Procedure: COLONOSCOPY;  Surgeon: Daneil Dolin, MD;  [facility];  Service: Endoscopy;  Laterality: N/A;  9:30  . ESOPHAGOGASTRODUODENOSCOPY N/A 01/29/2016   Procedure: ESOPHAGOGASTRODUODENOSCOPY (EGD);  Surgeon: Danie Binder, MD;  [facility];  Service: Endoscopy;  Laterality: N/A;  . POLYPECTOMY  02/28/2020   Procedure: POLYPECTOMY;  Surgeon: Daneil Dolin, MD;  [facility];  Service: Endoscopy;;    Family History  Problem Relation Age of Onset  . Heart Problems Mother   . Heart attack Father   . Heart disease Father   . Cancer Other   . Colon cancer Maternal Aunt     Social History   Socioeconomic History  .  Marital status: Married    Spouse name: Not on file  . Number of children: Not on file  . Years of education: Not on file  . Highest education level: Not on file  Occupational History  . Not on file  Tobacco Use  . Smoking status: Former Smoker    Years: 40.00    Types: Cigarettes    Quit date: 09/14/2015    Years since quitting: 5.4  . Smokeless tobacco: Never Used  Substance and  Sexual Activity  . Alcohol use: No    Alcohol/week: 0.0 standard drinks    Comment: Previously heavy beer drinker; quit 20 years ago  . Drug use: No  . Sexual activity: Never  Other Topics Concern  . Not on file  Social History Narrative  . Not on file   Social Determinants of Health   Financial Resource Strain: Not on file  Food Insecurity: Not on file  Transportation Needs: Not on file  Physical Activity: Not on file  Stress: Not on file  Social Connections: Not on file  Intimate Partner Violence: Not on file    ROS Review of Systems  Constitutional: Negative for chills and fever.  HENT: Negative for congestion and sore throat.   Eyes: Negative for pain and discharge.  Respiratory: Negative for cough and shortness of breath.   Cardiovascular: Negative for chest pain and palpitations.  Gastrointestinal: Negative for constipation, diarrhea, nausea and vomiting.  Endocrine: Negative for polydipsia and polyuria.  Genitourinary: Negative for dysuria and hematuria.  Musculoskeletal: Negative for neck pain and neck stiffness.  Skin: Negative for rash.  Neurological: Negative for dizziness, weakness, numbness and headaches.  Psychiatric/Behavioral: Negative for agitation and behavioral problems.    Objective:   Today's Vitals: BP 130/74 (BP Location: Left Arm, Patient Position: Sitting, Cuff Size: Normal)   Pulse 65   Temp 97.8 F (36.6 C) (Oral)   Resp 18   Ht _0  (1.854 m)   Wt 172 lb 1.9 oz (78.1 kg)   SpO2 98%   BMI 22.71 kg/m   Physical Exam Vitals reviewed.  Constitutional:      General: He is not in acute distress.    Appearance: He is not diaphoretic.  HENT:     Head: Normocephalic and atraumatic.     Nose: Nose normal.     Mouth/Throat:     Mouth: Mucous membranes are moist.  Eyes:     General: No scleral icterus.    Extraocular Movements: Extraocular movements intact.  Cardiovascular:     Rate and Rhythm: Normal rate and regular rhythm.      Pulses: Normal pulses.     Heart sounds: Normal heart sounds. No murmur heard.   Pulmonary:     Breath sounds: Normal breath sounds. No wheezing or rales.  Musculoskeletal:     Cervical back: Neck supple. No tenderness.     Right lower leg: No edema.     Left lower leg: No edema.  Skin:    General: Skin is warm.     Findings: No rash.  Neurological:     General: No focal deficit present.     Mental Status: He is alert and oriented to person, place, and time.  Psychiatric:        Mood and Affect: Mood normal.        Behavior: Behavior normal.     Assessment & Plan:   Problem List Items Addressed This Visit      Encounter to establish care    -  Primary Care established Previous chart reviewed History and medications reviewed with the patient  Relevant Orders  CMP14+EGFR  CBC    Cardiovascular and Mediastinum   Coronary artery disease involving native coronary artery with angina pectoris ([place])    S/p stent placement for STEMI in 2016 On Aspirin, Coreg and statin      Cardiomyopathy, ischemic    Had STEMI in 2016, s/p stent placement EF stable from last Echo in EMR F/u with Cardiology On Coreg      Essential (primary) hypertension    BP Readings from Last 1 Encounters:  02/18/21 130/74   Well-controlled with Coreg and Losartan Counseled for compliance with the medications Advised DASH diet and moderate exercise/walking, at least 150 mins/week       Relevant Orders   CBC     Respiratory   Chronic obstructive lung disease ([place])    Well-controlled with Anoro Ellipta and PRN Albuterol Quit smoking in 2016        Endocrine   Type 2 diabetes mellitus without complications ([place])    Lab Results  Component Value Date   HGBA1C 11.5 12/03/2020   On Metformin, follows up with ENdocrinology Advised to follow diabetic diet On statin and ARB F/u CMP and lipid panel Diabetic foot exam: Today Diabetic eye exam: Advised to follow up with Ophthalmology for  diabetic eye exam       Relevant Orders   CMP14+EGFR     Musculoskeletal and Integument   Psoriasis    On Stelara, follows up with Dermatology       Other Visit Diagnoses       Need for hepatitis C screening test       Relevant Orders   Hepatitis C Antibody      Outpatient Encounter Medications as of 02/18/2021  Medication Sig  . Albuterol Sulfate 108 (90 Base) MCG/ACT AEPB Inhale 1-2 puffs into the lungs 4 (four) times daily as needed (wheezing/shortness of breath.).   Marland Kitchen aspirin EC 81 MG tablet Take 1 tablet (81 mg total) by mouth daily.  Marland Kitchen atorvastatin (LIPITOR) 80 MG tablet Take 40 mg by mouth daily with supper.   . calcium carbonate (OS-CAL) 1250 (500 Ca) MG chewable tablet CHEW 1-2 TABLETS BY MOUTH AT BEDTIME AS NEEDED FOR INDIGESTION  . Carboxymethylcellulose Sod PF 0.25 % SOLN Place 1 drop into both eyes 4 (four) times daily as needed (dry/irritated eyes.).   Marland Kitchen carvedilol (COREG) 3.125 MG tablet Take 1 tablet (3.125 mg total) by mouth 2 (two) times daily with a meal.  . Cholecalciferol 25 MCG (1000 UT) tablet Take 1 tablet by mouth daily.  . clobetasol ointment (TEMOVATE) 4.23 % Apply 1 application topically 2 (two) times daily as needed (psoriasis).   . ezetimibe (ZETIA) 10 MG tablet Take 1 tablet by mouth daily.  . fluticasone (CUTIVATE) 0.05 % cream Apply 1 application topically 2 (two) times daily as needed (psoriasis (face & groin areas)).   Marland Kitchen ketoconazole (NIZORAL) 2 % shampoo Apply 1 application topically 2 (two) times a week. Applied to facial hair (beard) & scalp. Leave on 10 minutes prior to washing out.  Marland Kitchen losartan (COZAAR) 50 MG tablet Take 50 mg by mouth 2 (two) times daily.  . metFORMIN (GLUCOPHAGE) 1000 MG tablet Take 1 tablet by mouth 2 (two) times daily.  . nitroGLYCERIN (NITROSTAT) 0.4 MG SL tablet Place 1 tablet (0.4 mg total) under the tongue every 5 (five) minutes as needed for chest pain.  Marland Kitchen umeclidinium-vilanterol (ANORO ELLIPTA) 62.5-25  MCG/INH AEPB  Inhale 1 puff into the lungs daily.  Marland Kitchen Ustekinumab (STELARA [place]) Inject 1 Dose into the skin every 3 (three) months. Every 12 weeks   No facility-administered encounter medications on file as of 02/18/2021.    Follow-up: Return in about 6 months (around 08/21/2021) for HTN, CAD and COPD.   Lindell Spar, MD

## 2021-02-19 NOTE — Assessment & Plan Note (Signed)
BP Readings from Last 1 Encounters:  02/18/21 130/74   Well-controlled with Coreg and Losartan Counseled for compliance with the medications Advised DASH diet and moderate exercise/walking, at least 150 mins/week

## 2021-02-19 NOTE — Assessment & Plan Note (Signed)
Lab Results  Component Value Date   HGBA1C 11.5 12/03/2020   On Metformin, follows up with ENdocrinology Advised to follow diabetic diet On statin and ARB F/u CMP and lipid panel Diabetic foot exam: Today Diabetic eye exam: Advised to follow up with Ophthalmology for diabetic eye exam

## 2021-02-19 NOTE — Assessment & Plan Note (Signed)
S/p stent placement for STEMI in 2016 On Aspirin, Coreg and statin

## 2021-02-19 NOTE — Assessment & Plan Note (Signed)
Had STEMI in 2016, s/p stent placement EF stable from last Echo in EMR F/u with Cardiology On Coreg

## 2021-02-19 NOTE — Assessment & Plan Note (Signed)
Well-controlled with Anoro Ellipta and PRN Albuterol Quit smoking in 2016

## 2021-02-19 NOTE — Assessment & Plan Note (Signed)
On Stelara, follows up with Dermatology

## 2021-02-22 ENCOUNTER — Other Ambulatory Visit: Payer: Self-pay

## 2021-02-22 ENCOUNTER — Ambulatory Visit: Payer: Medicare HMO

## 2021-03-24 ENCOUNTER — Ambulatory Visit: Payer: Medicare HMO

## 2021-03-29 ENCOUNTER — Telehealth: Payer: Self-pay | Admitting: Nurse Practitioner

## 2021-03-29 NOTE — Telephone Encounter (Signed)
Wife came by with recent labs showing improved A1c of 6% and asked if medication needed to be reduced.  I recommend keeping Metformin at same dose at 1000 mg po twice daily with meals for now and if his glucose remains well controlled at next A1c check, we can discuss lowering dose.

## 2021-03-29 NOTE — Telephone Encounter (Signed)
Pt's wife called and asked for a call back at [phone]

## 2021-03-29 NOTE — Telephone Encounter (Signed)
Lft msg for pt to call back 

## 2021-03-29 NOTE — Telephone Encounter (Signed)
Pts wife notified and agrees 

## 2021-04-15 ENCOUNTER — Encounter: Payer: Self-pay | Admitting: Nurse Practitioner

## 2021-04-15 ENCOUNTER — Ambulatory Visit (INDEPENDENT_AMBULATORY_CARE_PROVIDER_SITE_OTHER): Payer: No Typology Code available for payment source | Admitting: Nurse Practitioner

## 2021-04-15 VITALS — BP 112/61 | HR 70 | Ht 73.0 in | Wt 171.0 lb

## 2021-04-15 DIAGNOSIS — E1165 Type 2 diabetes mellitus with hyperglycemia: Secondary | ICD-10-CM

## 2021-04-15 NOTE — Progress Notes (Signed)
Endocrinology Follow Up Note       04/15/2021, 1:56 PM   Subjective:    Patient ID: Grant Cardenas, male    DOB: Dec 25, 1955.  Grant Cardenas is being seen in follow up after being seen in consultation for management of currently uncontrolled symptomatic diabetes requested by  Lindell Spar, MD.   Past Medical History:  Diagnosis Date   CAD (coronary artery disease)    a. anterior ST elevation MI in 09/2015. Cardiac cath showed occluded prox LAD and 60% ostial RCA stenosis. There was also very distal disease affecting the LAD and ramus. He underwent successful angioplasty and drug-eluting stent placement to the LAD. Ejection fraction was 35-40% which improved subsequently an echo to 55-60%.   Chronic obstructive pulmonary disease, unspecified (HCC)    COPD (chronic obstructive pulmonary disease) ([place]) 03/2016   4th stage-Dr. Luan Pulling   Essential (primary) hypertension    Hyperlipidemia    Ischemic cardiomyopathy    a. EF by cath 09/13/2015: 35-40%; b. 09/14/2015: EF 55-60%, Probable akinesis of  the midanteroseptal myocardium. akinesis of the apicalanterior myocardium, GR1DD   MI (myocardial infarction) ([place])    Psoriasis    Psoriasis, unspecified    ST elevation (STEMI) myocardial infarction involving left anterior descending coronary artery ([place])  09/2015 09/13/2015   Tobacco use    Vitreous floaters of both eyes     Past Surgical History:  Procedure Laterality Date   CARDIAC CATHETERIZATION N/A 09/13/2015   Procedure: Left Heart Cath and Coronary Angiography;  Surgeon: Wellington Hampshire, MD;  Location: [facility];  Service: Cardiovascular;  Laterality: N/A;   CARDIAC CATHETERIZATION N/A 09/13/2015   Procedure: Coronary Stent Intervention;  Surgeon: Wellington Hampshire, MD;  Location: [facility];  Service: Cardiovascular;  Laterality: N/A;   CARDIAC CATHETERIZATION N/A 11/03/2015    Procedure: Left Heart Cath and Coronary Angiography;  Surgeon: Peter M Martinique, MD;  Location: [facility];  Service: Cardiovascular;  Laterality: N/A;   COLONOSCOPY N/A 01/29/2016   Procedure: COLONOSCOPY;  Surgeon: Danie Binder, MD;  [facility];  Service: Endoscopy;  Laterality: N/A;  1030-moved to 1215 office to notify    COLONOSCOPY N/A 02/28/2020   Procedure: COLONOSCOPY;  Surgeon: Daneil Dolin, MD;  Location: [facility];  Service: Endoscopy;  Laterality: N/A;  9:30   ESOPHAGOGASTRODUODENOSCOPY N/A 01/29/2016   Procedure: ESOPHAGOGASTRODUODENOSCOPY (EGD);  Surgeon: Danie Binder, MD;  [facility];  Service: Endoscopy;  Laterality: N/A;   POLYPECTOMY  02/28/2020   Procedure: POLYPECTOMY;  Surgeon: Daneil Dolin, MD;  [facility];  Service: Endoscopy;;    Social History   Socioeconomic History   Marital status: Married    Spouse name: Not on file   Number of children: Not on file   Years of education: Not on file   Highest education level: Not on file  Occupational History   Not on file  Tobacco Use   Smoking status: Former    Years: 40.00    Types: Cigarettes    Quit date: 09/14/2015    Years since quitting: 5.5   Smokeless tobacco: Never  Substance  and Sexual Activity   Alcohol use: No    Alcohol/week: 0.0 standard drinks    Comment: Previously heavy beer drinker; quit 20 years ago   Drug use: No   Sexual activity: Never  Other Topics Concern   Not on file  Social History Narrative   Not on file   Social Determinants of Health   Financial Resource Strain: Not on file  Food Insecurity: Not on file  Transportation Needs: Not on file  Physical Activity: Not on file  Stress: Not on file  Social Connections: Not on file    Family History  Problem Relation Age of Onset   Heart Problems Mother    Heart attack Father    Heart disease Father    Cancer Other    Colon cancer Maternal Aunt     Outpatient Encounter  Medications as of 04/15/2021  Medication Sig   Albuterol Sulfate 108 (90 Base) MCG/ACT AEPB Inhale 1-2 puffs into the lungs 4 (four) times daily as needed (wheezing/shortness of breath.).    aspirin EC 81 MG tablet Take 1 tablet (81 mg total) by mouth daily.   atorvastatin (LIPITOR) 80 MG tablet Take 40 mg by mouth daily with supper.    calcium carbonate (OS-CAL) 1250 (500 Ca) MG chewable tablet CHEW 1-2 TABLETS BY MOUTH AT BEDTIME AS NEEDED FOR INDIGESTION   Carboxymethylcellulose Sod PF 0.25 % SOLN Place 1 drop into both eyes 4 (four) times daily as needed (dry/irritated eyes.).    carvedilol (COREG) 3.125 MG tablet Take 1 tablet (3.125 mg total) by mouth 2 (two) times daily with a meal.   Cholecalciferol 25 MCG (1000 UT) tablet Take 1 tablet by mouth daily.   clobetasol ointment (TEMOVATE) 9.41 % Apply 1 application topically 2 (two) times daily as needed (psoriasis).    ezetimibe (ZETIA) 10 MG tablet Take 1 tablet by mouth daily.   fluticasone (CUTIVATE) 0.05 % cream Apply 1 application topically 2 (two) times daily as needed (psoriasis (face & groin areas)).    ketoconazole (NIZORAL) 2 % shampoo Apply 1 application topically 2 (two) times a week. Applied to facial hair (beard) & scalp. Leave on 10 minutes prior to washing out.   losartan (COZAAR) 50 MG tablet Take 50 mg by mouth 2 (two) times daily.   metFORMIN (GLUCOPHAGE) 1000 MG tablet Take 1 tablet by mouth 2 (two) times daily.   nitroGLYCERIN (NITROSTAT) 0.4 MG SL tablet Place 1 tablet (0.4 mg total) under the tongue every 5 (five) minutes as needed for chest pain.   umeclidinium-vilanterol (ANORO ELLIPTA) 62.5-25 MCG/INH AEPB Inhale 1 puff into the lungs daily.   Ustekinumab (STELARA [place]) Inject 1 Dose into the skin every 3 (three) months. Every 12 weeks   No facility-administered encounter medications on file as of 04/15/2021.    ALLERGIES: Allergies  Allergen Reactions   Sulfa Antibiotics Other (See Comments)    Sulfa drugs  (unaware of this reaction per patient spouse)      VACCINATION STATUS: Immunization History  Administered Date(s) Administered   Fluad Quad(high Dose 65+) 07/03/2020   Influenza Inj Mdck Quad Pf 08/11/2016   Influenza, High Dose Seasonal PF 09/05/2016   Influenza, Seasonal, Injecte, Preservative Fre 06/18/2019   Influenza,inj,Quad PF,6+ Mos 07/07/2017, 07/02/2018, 06/18/2019   Influenza-Unspecified 06/18/2019   Moderna Sars-Covid-2 Vaccination 11/07/2019, 12/05/2019, 08/04/2020, 01/12/2021   Pneumococcal Conjugate-13 06/07/2016   Pneumococcal Polysaccharide-23 07/07/2017   Td (Adult) 06/07/2016   Tdap 11/03/2004   Zoster Recombinat (Shingrix) 10/04/2019, 03/20/2020  Diabetes He presents for his follow-up diabetic visit. He has type 2 diabetes mellitus. Onset time: Diagnosed at approx age of 50. His disease course has been improving. There are no hypoglycemic associated symptoms. Pertinent negatives for diabetes include no fatigue and no polyuria. There are no hypoglycemic complications. Symptoms are improving. Diabetic complications include heart disease. Risk factors for coronary artery disease include diabetes mellitus, dyslipidemia, hypertension and male sex. Current diabetic treatment includes oral agent (monotherapy). He is compliant with treatment most of the time. His weight is stable. He is following a generally healthy diet. When asked about meal planning, he reported none. He has not had a previous visit with a dietitian. He participates in exercise intermittently. (He presents today, accompanied by his spouse, with no meter or logs (was not asked to routinely monitor due to monotherapy with Metformin only).  His previsit A1c was 6%, improving drastically from previous visit of 11.5%.  He continues to work on diet and exercise.  I general, he feels much better.  He denies any s/s of hypoglycemia.) An ACE inhibitor/angiotensin II receptor blocker is being taken. He does not see a  podiatrist.Eye exam is current.  Hyperlipidemia This is a chronic problem. The current episode started more than 1 year ago. The problem is controlled. Recent lipid tests were reviewed and are normal. Exacerbating diseases include diabetes. Factors aggravating his hyperlipidemia include beta blockers. Current antihyperlipidemic treatment includes statins. The current treatment provides significant improvement of lipids. There are no compliance problems.  Risk factors for coronary artery disease include diabetes mellitus, dyslipidemia, hypertension and male sex.  Hypertension This is a chronic problem. The current episode started more than 1 year ago. The problem has been resolved since onset. The problem is controlled. There are no associated agents to hypertension. Risk factors for coronary artery disease include diabetes mellitus, dyslipidemia and male gender. Past treatments include angiotensin blockers and beta blockers. The current treatment provides moderate improvement. There are no compliance problems.  Hypertensive end-organ damage includes CAD/MI.    Review of systems  Constitutional: + Minimally fluctuating body weight,  current Body mass index is 22.56 kg/m. , no fatigue, no subjective hyperthermia, no subjective hypothermia Eyes: no blurry vision, no xerophthalmia ENT: no sore throat, no nodules palpated in throat, no dysphagia/odynophagia, no hoarseness Cardiovascular: no chest pain, no shortness of breath, no palpitations, no leg swelling Respiratory: no cough, no shortness of breath Gastrointestinal: no nausea/vomiting/diarrhea Musculoskeletal: no muscle/joint aches Skin: no rashes, no hyperemia Neurological: no tremors, no numbness, no tingling, no dizziness Psychiatric: no depression, no anxiety  Objective:     BP 112/61   Pulse 70   Ht 6\' 1"  (1.854 m)   Wt 171 lb (77.6 kg)   BMI 22.56 kg/m   Wt Readings from Last 3 Encounters:  04/15/21 171 lb (77.6 kg)  02/18/21  172 lb 1.9 oz (78.1 kg)  01/28/21 172 lb 12.8 oz (78.4 kg)     BP Readings from Last 3 Encounters:  04/15/21 112/61  02/18/21 130/74  01/28/21 124/75      Physical Exam- Limited  Constitutional:  Body mass index is 22.56 kg/m. , not in acute distress, normal state of mind Eyes:  EOMI, no exophthalmos Neck: Supple Cardiovascular: RRR, no murmurs, rubs, or gallops, no edema Respiratory: Adequate breathing efforts, no crackles, rales, rhonchi, or wheezing Musculoskeletal: no gross deformities, strength intact in all four extremities, no gross restriction of joint movements Skin:  no rashes, no hyperemia Neurological: no tremor with outstretched hands  POCT ABI Results 04/15/21   Right ABI:  1.10      Left ABI:  1.09  Right leg systolic / diastolic: 119/14 mmHg Left leg systolic / diastolic: 782/95 mmHg  Arm systolic / diastolic: 621/30 mmHG  Detailed report will be scanned into patient chart.   CMP ( most recent) CMP     Component Value Date/Time   NA 130 (L) 11/28/2020 1328   NA 140 11/08/2018 0000   K 4.5 11/28/2020 1328   CL 99 11/28/2020 1328   CO2 19 (L) 11/28/2020 1328   GLUCOSE 448 (H) 11/28/2020 1328   BUN 21 11/28/2020 1328   BUN 14 11/08/2018 0000   CREATININE 1.3 12/03/2020 0000   CREATININE 1.14 11/28/2020 1328   CALCIUM 9.3 11/28/2020 1328   PROT 7.7 11/28/2020 1328   PROT 7.2 09/23/2015 1540   ALBUMIN 4.4 11/28/2020 1328   ALBUMIN 4.4 09/23/2015 1540   AST 24 12/03/2020 0000   ALT 69 (A) 12/03/2020 0000   ALKPHOS 101 11/28/2020 1328   BILITOT 2.7 (H) 11/28/2020 1328   BILITOT 0.9 09/23/2015 1540   GFRNONAA >60 11/28/2020 1328   GFRAA 82 11/08/2018 0000     Diabetic Labs (most recent): Lab Results  Component Value Date   HGBA1C 11.5 12/03/2020   HGBA1C 6.6 11/08/2018     Lipid Panel ( most recent) Lipid Panel     Component Value Date/Time   CHOL 86 12/03/2020 0000   CHOL 212 (H) 09/23/2015 1540   TRIG 115 12/03/2020 0000   HDL  43 11/08/2018 0000   HDL 41 09/23/2015 1540   CHOLHDL 3.7 11/26/2015 0815   VLDL 17 11/26/2015 0815   LDLCALC 26 12/03/2020 0000   LDLCALC 137 (H) 09/23/2015 1540   LABVLDL 34 09/23/2015 1540      Lab Results  Component Value Date   TSH 2.61 11/08/2018           Assessment & Plan:   1) Uncontrolled Type 2 Diabetes without complications  He presents today, accompanied by his spouse, with no meter or logs (was not asked to routinely monitor due to monotherapy with Metformin only).  His previsit A1c was 6%, improving drastically from previous visit of 11.5%.  He continues to work on diet and exercise.  I general, he feels much better.  He denies any s/s of hypoglycemia.  - Grant Cardenas has currently uncontrolled symptomatic type 2 DM since 65 years of age.   -Recent labs reviewed.  - I had a long discussion with him about the progressive nature of diabetes and the pathology behind its complications. -his diabetes is complicated by CAD (history of MI) and he remains at a high risk for more acute and chronic complications which include CVA, CKD, retinopathy, and neuropathy. These are all discussed in detail with him.  - Nutritional counseling repeated at each appointment due to patients tendency to fall back in to old habits.  - The patient admits there is a room for improvement in their diet and drink choices. -  Suggestion is made for the patient to avoid simple carbohydrates from their diet including Cakes, Sweet Desserts / Pastries, Ice Cream, Soda (diet and regular), Sweet Tea, Candies, Chips, Cookies, Sweet Pastries, Store Bought Juices, Alcohol in Excess of 1-2 drinks a day, Artificial Sweeteners, Coffee Creamer, and "Sugar-free" Products. This will help patient to have stable blood glucose profile and potentially avoid unintended weight gain.   - I encouraged the patient to switch to unprocessed  or minimally processed complex starch and increased protein intake (animal  or plant source), fruits, and vegetables.   - Patient is advised to stick to a routine mealtimes to eat 3 meals a day and avoid unnecessary snacks (to snack only to correct hypoglycemia).  - I have approached him with the following individualized plan to manage  his diabetes and patient agrees:   -Based on his improved glycemic profile, he is advised to lower his dose of Metformin to 500 mg po twice daily with meals (can take half of his current pills until he depletes his supply).    He can take a break from monitoring blood glucose at this time.  - he is not a good candidate for incretin therapy due to body habitus.  - Specific targets for  A1c;  LDL, HDL,  and Triglycerides were discussed with the patient.  2) Blood Pressure /Hypertension:  his blood pressure is controlled to target.   he is advised to continue his current medications including Carvedilol 3.125 mg po twice daily and Losartan 50 mg p.o. daily with breakfast.  3) Lipids/Hyperlipidemia:    Review of his recent lipid panel from 12/03/20 showed controlled  LDL at 26 .  he  is advised to continue Lipitor 80 mg po daily at bedtime and Zetia 10 mg daily at bedtime.  Side effects and precautions discussed with him.  4)  Weight/Diet:  his Body mass index is 22.56 kg/m.  -  he is not a candidate for weight loss. Exercise, and detailed carbohydrates information provided  -  detailed on discharge instructions.  5) Chronic Care/Health Maintenance: -he is on ACEI/ARB and Statin medications and is encouraged to initiate and continue to follow up with Ophthalmology, Dentist, Podiatrist at least yearly or according to recommendations, and advised to stay away from smoking. I have recommended yearly flu vaccine and pneumonia vaccine at least every 5 years; moderate intensity exercise for up to 150 minutes weekly; and sleep for at least 7 hours a day.  - he is advised to maintain close follow up with Lindell Spar, MD for primary care  needs, as well as his other providers for optimal and coordinated care.      I spent 25 minutes in the care of the patient today including review of labs from [facility], Lipids, Thyroid Function, Hematology (current and previous including abstractions from other facilities); face-to-face time discussing  his blood glucose readings/logs, discussing hypoglycemia and hyperglycemia episodes and symptoms, medications doses, his options of short and long term treatment based on the latest standards of care / guidelines;  discussion about incorporating lifestyle medicine;  and documenting the encounter.    Please refer to Patient Instructions for Blood Glucose Monitoring and Insulin/Medications Dosing Guide"  in media tab for additional information. Please  also refer to " Patient Self Inventory" in the Media  tab for reviewed elements of pertinent patient history.  Grant Cardenas participated in the discussions, expressed understanding, and voiced agreement with the above plans.  All questions were answered to his satisfaction. he is encouraged to contact clinic should he have any questions or concerns prior to his return visit.   Follow up plan: - No follow-ups on file.  Rayetta Pigg, [facility] [address] [place], [place] [address] Phone: [phone] Fax: [phone]  04/15/2021, 1:56 PM

## 2021-04-15 NOTE — Progress Notes (Signed)
Endocrinology Consult Note       04/15/2021, 2:13 PM   Subjective:    Patient ID: Grant Cardenas, male    DOB: 01/03/1956.  Grant Cardenas is being seen in consultation for management of currently uncontrolled symptomatic diabetes requested by  Lindell Spar, MD.   Past Medical History:  Diagnosis Date   CAD (coronary artery disease)    a. anterior ST elevation MI in 09/2015. Cardiac cath showed occluded prox LAD and 60% ostial RCA stenosis. There was also very distal disease affecting the LAD and ramus. He underwent successful angioplasty and drug-eluting stent placement to the LAD. Ejection fraction was 35-40% which improved subsequently an echo to 55-60%.   Chronic obstructive pulmonary disease, unspecified (HCC)    COPD (chronic obstructive pulmonary disease) ([place]) 03/2016   4th stage-Dr. Luan Pulling   Essential (primary) hypertension    Hyperlipidemia    Ischemic cardiomyopathy    a. EF by cath 09/13/2015: 35-40%; b. 09/14/2015: EF 55-60%, Probable akinesis of  the midanteroseptal myocardium. akinesis of the apicalanterior myocardium, GR1DD   MI (myocardial infarction) ([place])    Psoriasis    Psoriasis, unspecified    ST elevation (STEMI) myocardial infarction involving left anterior descending coronary artery ([place])  09/2015 09/13/2015   Tobacco use    Vitreous floaters of both eyes     Past Surgical History:  Procedure Laterality Date   CARDIAC CATHETERIZATION N/A 09/13/2015   Procedure: Left Heart Cath and Coronary Angiography;  Surgeon: Wellington Hampshire, MD;  Location: [facility];  Service: Cardiovascular;  Laterality: N/A;   CARDIAC CATHETERIZATION N/A 09/13/2015   Procedure: Coronary Stent Intervention;  Surgeon: Wellington Hampshire, MD;  Location: [facility];  Service: Cardiovascular;  Laterality: N/A;   CARDIAC CATHETERIZATION N/A 11/03/2015   Procedure: Left Heart Cath and Coronary  Angiography;  Surgeon: Peter M Martinique, MD;  Location: [facility];  Service: Cardiovascular;  Laterality: N/A;   COLONOSCOPY N/A 01/29/2016   Procedure: COLONOSCOPY;  Surgeon: Danie Binder, MD;  [facility];  Service: Endoscopy;  Laterality: N/A;  1030-moved to 1215 office to notify    COLONOSCOPY N/A 02/28/2020   Procedure: COLONOSCOPY;  Surgeon: Daneil Dolin, MD;  Location: [facility];  Service: Endoscopy;  Laterality: N/A;  9:30   ESOPHAGOGASTRODUODENOSCOPY N/A 01/29/2016   Procedure: ESOPHAGOGASTRODUODENOSCOPY (EGD);  Surgeon: Danie Binder, MD;  [facility];  Service: Endoscopy;  Laterality: N/A;   POLYPECTOMY  02/28/2020   Procedure: POLYPECTOMY;  Surgeon: Daneil Dolin, MD;  [facility];  Service: Endoscopy;;    Social History   Socioeconomic History   Marital status: Married    Spouse name: Not on file   Number of children: Not on file   Years of education: Not on file   Highest education level: Not on file  Occupational History   Not on file  Tobacco Use   Smoking status: Former    Years: 40.00    Types: Cigarettes    Quit date: 09/14/2015    Years since quitting: 5.5   Smokeless tobacco: Never  Substance and Sexual Activity   Alcohol use:  No    Alcohol/week: 0.0 standard drinks    Comment: Previously heavy beer drinker; quit 20 years ago   Drug use: No   Sexual activity: Never  Other Topics Concern   Not on file  Social History Narrative   Not on file   Social Determinants of Health   Financial Resource Strain: Not on file  Food Insecurity: Not on file  Transportation Needs: Not on file  Physical Activity: Not on file  Stress: Not on file  Social Connections: Not on file    Family History  Problem Relation Age of Onset   Heart Problems Mother    Heart attack Father    Heart disease Father    Cancer Other    Colon cancer Maternal Aunt     Outpatient Encounter Medications as of 04/15/2021  Medication  Sig   Albuterol Sulfate 108 (90 Base) MCG/ACT AEPB Inhale 1-2 puffs into the lungs 4 (four) times daily as needed (wheezing/shortness of breath.).    aspirin EC 81 MG tablet Take 1 tablet (81 mg total) by mouth daily.   atorvastatin (LIPITOR) 80 MG tablet Take 40 mg by mouth daily with supper.    calcium carbonate (OS-CAL) 1250 (500 Ca) MG chewable tablet CHEW 1-2 TABLETS BY MOUTH AT BEDTIME AS NEEDED FOR INDIGESTION   Carboxymethylcellulose Sod PF 0.25 % SOLN Place 1 drop into both eyes 4 (four) times daily as needed (dry/irritated eyes.).    carvedilol (COREG) 3.125 MG tablet Take 1 tablet (3.125 mg total) by mouth 2 (two) times daily with a meal.   Cholecalciferol 25 MCG (1000 UT) tablet Take 1 tablet by mouth daily.   clobetasol ointment (TEMOVATE) 4.81 % Apply 1 application topically 2 (two) times daily as needed (psoriasis).    ezetimibe (ZETIA) 10 MG tablet Take 1 tablet by mouth daily.   fluticasone (CUTIVATE) 0.05 % cream Apply 1 application topically 2 (two) times daily as needed (psoriasis (face & groin areas)).    ketoconazole (NIZORAL) 2 % shampoo Apply 1 application topically 2 (two) times a week. Applied to facial hair (beard) & scalp. Leave on 10 minutes prior to washing out.   losartan (COZAAR) 50 MG tablet Take 50 mg by mouth 2 (two) times daily.   metFORMIN (GLUCOPHAGE) 1000 MG tablet Take 500 mg by mouth 2 (two) times daily.   nitroGLYCERIN (NITROSTAT) 0.4 MG SL tablet Place 1 tablet (0.4 mg total) under the tongue every 5 (five) minutes as needed for chest pain.   umeclidinium-vilanterol (ANORO ELLIPTA) 62.5-25 MCG/INH AEPB Inhale 1 puff into the lungs daily.   Ustekinumab (STELARA [place]) Inject 1 Dose into the skin every 3 (three) months. Every 12 weeks   No facility-administered encounter medications on file as of 04/15/2021.    ALLERGIES: Allergies  Allergen Reactions   Sulfa Antibiotics Other (See Comments)    Sulfa drugs (unaware of this reaction per patient spouse)       VACCINATION STATUS: Immunization History  Administered Date(s) Administered   Fluad Quad(high Dose 65+) 07/03/2020   Influenza Inj Mdck Quad Pf 08/11/2016   Influenza, High Dose Seasonal PF 09/05/2016   Influenza, Seasonal, Injecte, Preservative Fre 06/18/2019   Influenza,inj,Quad PF,6+ Mos 07/07/2017, 07/02/2018, 06/18/2019   Influenza-Unspecified 06/18/2019   Moderna Sars-Covid-2 Vaccination 11/07/2019, 12/05/2019, 08/04/2020, 01/12/2021   Pneumococcal Conjugate-13 06/07/2016   Pneumococcal Polysaccharide-23 07/07/2017   Td (Adult) 06/07/2016   Tdap 11/03/2004   Zoster Recombinat (Shingrix) 10/04/2019, 03/20/2020    Diabetes He presents for his follow-up diabetic  visit. He has type 2 diabetes mellitus. Onset time: Diagnosed at approx age of 48. His disease course has been improving. There are no hypoglycemic associated symptoms. Pertinent negatives for diabetes include no fatigue and no polyuria. There are no hypoglycemic complications. Symptoms are improving. Diabetic complications include heart disease. Risk factors for coronary artery disease include diabetes mellitus, dyslipidemia, hypertension and male sex. Current diabetic treatment includes oral agent (monotherapy). He is compliant with treatment most of the time. His weight is decreasing steadily. He is following a generally healthy diet. When asked about meal planning, he reported none. He has not had a previous visit with a dietitian. He participates in exercise intermittently. His home blood glucose trend is decreasing steadily. His breakfast blood glucose range is generally 90-110 mg/dl. His overall blood glucose range is 90-110 mg/dl. (He presents today, accompanied by his wife, with his meter and logs showing at goal fasting glycemic profile.  He has worked hard on diet and exercise, and although he is very afraid of needles, he did monitor glucose as requested.) An ACE inhibitor/angiotensin II receptor blocker is being taken.  He does not see a podiatrist.Eye exam is current.  Hyperlipidemia This is a chronic problem. The current episode started more than 1 year ago. The problem is controlled. Recent lipid tests were reviewed and are normal. Exacerbating diseases include diabetes. Factors aggravating his hyperlipidemia include beta blockers. Current antihyperlipidemic treatment includes statins. The current treatment provides significant improvement of lipids. There are no compliance problems.  Risk factors for coronary artery disease include diabetes mellitus, dyslipidemia, hypertension and male sex.  Hypertension This is a chronic problem. The current episode started more than 1 year ago. The problem has been resolved since onset. The problem is controlled. There are no associated agents to hypertension. Risk factors for coronary artery disease include diabetes mellitus, dyslipidemia and male gender. Past treatments include angiotensin blockers and beta blockers. The current treatment provides moderate improvement. There are no compliance problems.  Hypertensive end-organ damage includes CAD/MI.    Review of systems  Constitutional: + Minimally fluctuating body weight,  current Body mass index is 22.56 kg/m. , no fatigue, no subjective hyperthermia, no subjective hypothermia Eyes: no blurry vision, no xerophthalmia ENT: no sore throat, no nodules palpated in throat, no dysphagia/odynophagia, no hoarseness Cardiovascular: no chest pain, no shortness of breath, no palpitations, no leg swelling Respiratory: no cough, no shortness of breath Gastrointestinal: no nausea/vomiting/diarrhea Musculoskeletal: no muscle/joint aches Skin: no rashes, no hyperemia Neurological: no tremors, no numbness, no tingling, no dizziness Psychiatric: no depression, no anxiety  Objective:     BP 112/61   Pulse 70   Ht 6\' 1"  (1.854 m)   Wt 171 lb (77.6 kg)   BMI 22.56 kg/m   Wt Readings from Last 3 Encounters:  04/15/21 171 lb (77.6  kg)  02/18/21 172 lb 1.9 oz (78.1 kg)  01/28/21 172 lb 12.8 oz (78.4 kg)     BP Readings from Last 3 Encounters:  04/15/21 112/61  02/18/21 130/74  01/28/21 124/75     Physical Exam- Limited  Constitutional:  Body mass index is 22.8 kg/m. , not in acute distress, slightly anxious state of mind Eyes:  EOMI, no exophthalmos Neck: Supple Cardiovascular: RRR, no murmers, rubs, or gallops, no edema Respiratory: Adequate breathing efforts, no crackles, rales, rhonchi, or wheezing Musculoskeletal: no gross deformities, strength intact in all four extremities, no gross restriction of joint movements Skin:  no rashes, no hyperemia Neurological: no tremor with outstretched hands  CMP ( most recent) CMP     Component Value Date/Time   NA 130 (L) 11/28/2020 1328   NA 140 11/08/2018 0000   K 4.5 11/28/2020 1328   CL 99 11/28/2020 1328   CO2 19 (L) 11/28/2020 1328   GLUCOSE 448 (H) 11/28/2020 1328   BUN 21 11/28/2020 1328   BUN 14 11/08/2018 0000   CREATININE 1.3 12/03/2020 0000   CREATININE 1.14 11/28/2020 1328   CALCIUM 9.3 11/28/2020 1328   PROT 7.7 11/28/2020 1328   PROT 7.2 09/23/2015 1540   ALBUMIN 4.4 11/28/2020 1328   ALBUMIN 4.4 09/23/2015 1540   AST 24 12/03/2020 0000   ALT 69 (A) 12/03/2020 0000   ALKPHOS 101 11/28/2020 1328   BILITOT 2.7 (H) 11/28/2020 1328   BILITOT 0.9 09/23/2015 1540   GFRNONAA >60 11/28/2020 1328   GFRAA 82 11/08/2018 0000     Diabetic Labs (most recent): Lab Results  Component Value Date   HGBA1C 11.5 12/03/2020   HGBA1C 6.6 11/08/2018     Lipid Panel ( most recent) Lipid Panel     Component Value Date/Time   CHOL 86 12/03/2020 0000   CHOL 212 (H) 09/23/2015 1540   TRIG 115 12/03/2020 0000   HDL 43 11/08/2018 0000   HDL 41 09/23/2015 1540   CHOLHDL 3.7 11/26/2015 0815   VLDL 17 11/26/2015 0815   LDLCALC 26 12/03/2020 0000   LDLCALC 137 (H) 09/23/2015 1540   LABVLDL 34 09/23/2015 1540      Lab Results  Component Value  Date   TSH 2.61 11/08/2018           Assessment & Plan:   1) Uncontrolled Type 2 Diabetes without complications  He presents today, accompanied by his wife, with his meter and logs showing at goal fasting glycemic profile.  He has worked hard on diet and exercise, and although he is very afraid of needles, he did monitor glucose as requested.  - Grant Cardenas has currently uncontrolled symptomatic type 2 DM since 65 years of age, with most recent A1c of 11.5 %.   -Recent labs reviewed.  - I had a long discussion with him about the progressive nature of diabetes and the pathology behind its complications. -his diabetes is complicated by CAD (history of MI) and he remains at a high risk for more acute and chronic complications which include CVA, CKD, retinopathy, and neuropathy. These are all discussed in detail with him.  - Nutritional counseling repeated at each appointment due to patients tendency to fall back in to old habits.  - The patient admits there is a room for improvement in their diet and drink choices. -  Suggestion is made for the patient to avoid simple carbohydrates from their diet including Cakes, Sweet Desserts / Pastries, Ice Cream, Soda (diet and regular), Sweet Tea, Candies, Chips, Cookies, Sweet Pastries,  Store Bought Juices, Alcohol in Excess of  1-2 drinks a day, Artificial Sweeteners, Coffee Creamer, and "Sugar-free" Products. This will help patient to have stable blood glucose profile and potentially avoid unintended weight gain.   - I encouraged the patient to switch to  unprocessed or minimally processed complex starch and increased protein intake (animal or plant source), fruits, and vegetables.   - Patient is advised to stick to a routine mealtimes to eat 3 meals  a day and avoid unnecessary snacks ( to snack only to correct hypoglycemia).  - I have approached him with the following individualized plan to manage  his  diabetes and patient agrees:    -Given his stable glycemic profile with at goal fasting profile, he is advised to continue Metformin 1000 mg po twice daily with meals, therapeutically suitable for patient.  He can take a break from monitoring blood glucose at this time.  - he will be considered for incretin therapy as appropriate next visit.  - Specific targets for  A1c;  LDL, HDL,  and Triglycerides were discussed with the patient.  2) Blood Pressure /Hypertension:  his blood pressure is controlled to target.   he is advised to continue his current medications including Carvedilol 3.125 mg po twice daily and Losartan 50 mg p.o. daily with breakfast.  3) Lipids/Hyperlipidemia:    Review of his recent lipid panel from 12/03/20 showed controlled  LDL at 26 .  he  is advised to continue Lipitor 80 mg po daily at bedtime and Zetia 10 mg daily at bedtime.  Side effects and precautions discussed with him.  4)  Weight/Diet:  his Body mass index is 22.56 kg/m.  -  he is not a candidate for weight loss. Exercise, and detailed carbohydrates information provided  -  detailed on discharge instructions.  5) Chronic Care/Health Maintenance: -he is on ACEI/ARB and Statin medications and is encouraged to initiate and continue to follow up with Ophthalmology, Dentist, Podiatrist at least yearly or according to recommendations, and advised to stay away from smoking. I have recommended yearly flu vaccine and pneumonia vaccine at least every 5 years; moderate intensity exercise for up to 150 minutes weekly; and sleep for at least 7 hours a day.  - he is advised to maintain close follow up with Lindell Spar, MD for primary care needs, as well as his other providers for optimal and coordinated care.     I spent 30 minutes in the care of the patient today including review of labs from [facility], Lipids, Thyroid Function, Hematology (current and previous including abstractions from other facilities); face-to-face time discussing  his blood glucose  readings/logs, discussing hypoglycemia and hyperglycemia episodes and symptoms, medications doses, his options of short and long term treatment based on the latest standards of care / guidelines;  discussion about incorporating lifestyle medicine;  and documenting the encounter.    Please refer to Patient Instructions for Blood Glucose Monitoring and Insulin/Medications Dosing Guide"  in media tab for additional information. Please  also refer to " Patient Self Inventory" in the Media  tab for reviewed elements of pertinent patient history.  Grant Cardenas participated in the discussions, expressed understanding, and voiced agreement with the above plans.  All questions were answered to his satisfaction. he is encouraged to contact clinic should he have any questions or concerns prior to his return visit.   Follow up plan: - Return in about 4 months (around 08/16/2021) for Diabetes F/U- A1c and UM in office, No previsit labs.  Rayetta Pigg, [facility] [address] [place], [place] [address] Phone: [phone] Fax: [phone]  04/15/2021, 2:13 PM

## 2021-04-15 NOTE — Patient Instructions (Signed)

## 2021-04-29 ENCOUNTER — Ambulatory Visit: Payer: No Typology Code available for payment source | Admitting: Nurse Practitioner

## 2021-05-03 ENCOUNTER — Ambulatory Visit: Payer: No Typology Code available for payment source | Admitting: Nurse Practitioner

## 2021-05-03 ENCOUNTER — Ambulatory Visit (INDEPENDENT_AMBULATORY_CARE_PROVIDER_SITE_OTHER): Payer: Medicare HMO

## 2021-05-03 ENCOUNTER — Other Ambulatory Visit: Payer: Self-pay

## 2021-05-03 VITALS — BP 97/66 | HR 63 | Temp 98.2°F | Resp 20 | Ht 73.0 in | Wt 172.0 lb

## 2021-05-03 DIAGNOSIS — Z0000 Encounter for general adult medical examination without abnormal findings: Secondary | ICD-10-CM | POA: Diagnosis not present

## 2021-05-03 NOTE — Patient Instructions (Signed)
Grant Cardenas , Thank you for taking time to come for your Medicare Wellness Visit. I appreciate your ongoing commitment to your health goals. Please review the following plan we discussed and let me know if I can assist you in the future.   Screening recommendations/referrals: Colonoscopy: Up to date, due 2031   Recommended yearly ophthalmology/optometry visit for glaucoma screening and checkup Recommended yearly dental visit for hygiene and checkup  Vaccinations: Influenza vaccine: Due Fall 2022  Pneumococcal vaccine: Completed  Tdap vaccine: Due  Shingles vaccine: Complete    Advanced directives: Living Will   Conditions/risks identified: None   Next appointment: 08/20/2021 @ 10:40 am with Dr. Posey Pronto  Preventive Care 65 Years and Older, Male Preventive care refers to lifestyle choices and visits with your health care provider that can promote health and wellness. What does preventive care include? A yearly physical exam. This is also called an annual well check. Dental exams once or twice a year. Routine eye exams. Ask your health care provider how often you should have your eyes checked. Personal lifestyle choices, including: Daily care of your teeth and gums. Regular physical activity. Eating a healthy diet. Avoiding tobacco and drug use. Limiting alcohol use. Practicing safe sex. Taking low doses of aspirin every day. Taking vitamin and mineral supplements as recommended by your health care provider. What happens during an annual well check? The services and screenings done by your health care provider during your annual well check will depend on your age, overall health, lifestyle risk factors, and family history of disease. Counseling  Your health care provider may ask you questions about your: Alcohol use. Tobacco use. Drug use. Emotional well-being. Home and relationship well-being. Sexual activity. Eating habits. History of falls. Memory and ability to  understand (cognition). Work and work Statistician. Screening  You may have the following tests or measurements: Height, weight, and BMI. Blood pressure. Lipid and cholesterol levels. These may be checked every 5 years, or more frequently if you are over 20 years old. Skin check. Lung cancer screening. You may have this screening every year starting at age 7 if you have a 30-pack-year history of smoking and currently smoke or have quit within the past 15 years. Fecal occult blood test (FOBT) of the stool. You may have this test every year starting at age 71. Flexible sigmoidoscopy or colonoscopy. You may have a sigmoidoscopy every 5 years or a colonoscopy every 10 years starting at age 61. Prostate cancer screening. Recommendations will vary depending on your family history and other risks. Hepatitis C blood test. Hepatitis B blood test. Sexually transmitted disease (STD) testing. Diabetes screening. This is done by checking your blood sugar (glucose) after you have not eaten for a while (fasting). You may have this done every 1-3 years. Abdominal aortic aneurysm (AAA) screening. You may need this if you are a current or former smoker. Osteoporosis. You may be screened starting at age 28 if you are at high risk. Talk with your health care provider about your test results, treatment options, and if necessary, the need for more tests. Vaccines  Your health care provider may recommend certain vaccines, such as: Influenza vaccine. This is recommended every year. Tetanus, diphtheria, and acellular pertussis (Tdap, Td) vaccine. You may need a Td booster every 10 years. Zoster vaccine. You may need this after age 44. Pneumococcal 13-valent conjugate (PCV13) vaccine. One dose is recommended after age 68. Pneumococcal polysaccharide (PPSV23) vaccine. One dose is recommended after age 75. Talk to your health  care provider about which screenings and vaccines you need and how often you need them. This  information is not intended to replace advice given to you by your health care provider. Make sure you discuss any questions you have with your health care provider. Document Released: 10/16/2015 Document Revised: 06/08/2016 Document Reviewed: 07/21/2015 Elsevier Interactive Patient Education  2017 [place] Prevention in the Home Falls can cause injuries. They can happen to people of all ages. There are many things you can do to make your home safe and to help prevent falls. What can I do on the outside of my home? Regularly fix the edges of walkways and driveways and fix any cracks. Remove anything that might make you trip as you walk through a door, such as a raised step or threshold. Trim any bushes or trees on the path to your home. Use bright outdoor lighting. Clear any walking paths of anything that might make someone trip, such as rocks or tools. Regularly check to see if handrails are loose or broken. Make sure that both sides of any steps have handrails. Any raised decks and porches should have guardrails on the edges. Have any leaves, snow, or ice cleared regularly. Use sand or salt on walking paths during winter. Clean up any spills in your garage right away. This includes oil or grease spills. What can I do in the bathroom? Use night lights. Install grab bars by the toilet and in the tub and shower. Do not use towel bars as grab bars. Use non-skid mats or decals in the tub or shower. If you need to sit down in the shower, use a plastic, non-slip stool. Keep the floor dry. Clean up any water that spills on the floor as soon as it happens. Remove soap buildup in the tub or shower regularly. Attach bath mats securely with double-sided non-slip rug tape. Do not have throw rugs and other things on the floor that can make you trip. What can I do in the bedroom? Use night lights. Make sure that you have a light by your bed that is easy to reach. Do not use any sheets or  blankets that are too big for your bed. They should not hang down onto the floor. Have a firm chair that has side arms. You can use this for support while you get dressed. Do not have throw rugs and other things on the floor that can make you trip. What can I do in the kitchen? Clean up any spills right away. Avoid walking on wet floors. Keep items that you use a lot in easy-to-reach places. If you need to reach something above you, use a strong step stool that has a grab bar. Keep electrical cords out of the way. Do not use floor polish or wax that makes floors slippery. If you must use wax, use non-skid floor wax. Do not have throw rugs and other things on the floor that can make you trip. What can I do with my stairs? Do not leave any items on the stairs. Make sure that there are handrails on both sides of the stairs and use them. Fix handrails that are broken or loose. Make sure that handrails are as long as the stairways. Check any carpeting to make sure that it is firmly attached to the stairs. Fix any carpet that is loose or worn. Avoid having throw rugs at the top or bottom of the stairs. If you do have throw rugs, attach them to  the floor with carpet tape. Make sure that you have a light switch at the top of the stairs and the bottom of the stairs. If you do not have them, ask someone to add them for you. What else can I do to help prevent falls? Wear shoes that: Do not have high heels. Have rubber bottoms. Are comfortable and fit you well. Are closed at the toe. Do not wear sandals. If you use a stepladder: Make sure that it is fully opened. Do not climb a closed stepladder. Make sure that both sides of the stepladder are locked into place. Ask someone to hold it for you, if possible. Clearly mark and make sure that you can see: Any grab bars or handrails. First and last steps. Where the edge of each step is. Use tools that help you move around (mobility aids) if they are  needed. These include: Canes. Walkers. Scooters. Crutches. Turn on the lights when you go into a dark area. Replace any light bulbs as soon as they burn out. Set up your furniture so you have a clear path. Avoid moving your furniture around. If any of your floors are uneven, fix them. If there are any pets around you, be aware of where they are. Review your medicines with your doctor. Some medicines can make you feel dizzy. This can increase your chance of falling. Ask your doctor what other things that you can do to help prevent falls. This information is not intended to replace advice given to you by your health care provider. Make sure you discuss any questions you have with your health care provider. Document Released: 07/16/2009 Document Revised: 02/25/2016 Document Reviewed: 10/24/2014 Elsevier Interactive Patient Education  2017 [employer].

## 2021-05-03 NOTE — Progress Notes (Signed)
Subjective:   Grant Cardenas is a 65 y.o. male who presents for Medicare Annual/Subsequent preventive examination.         Objective:    There were no vitals filed for this visit. There is no height or weight on file to calculate BMI.  Advanced Directives 11/28/2020 02/28/2020 01/29/2016 12/22/2015 11/02/2015 11/02/2015 09/13/2015  Does Patient Have a Medical Advance Directive? No No Yes Yes Yes Yes No  Type of Advance Directive - Public librarian;Living will Living will Healthcare Power of [place] -  Does patient want to make changes to medical advance directive? - - - No - Patient declined No - Patient declined - -  Copy of [place] in Chart? - - No - copy requested No - copy requested - - -  Would patient like information on creating a medical advance directive? No - Patient declined No - Patient declined - - - - -    Current Medications (verified) Outpatient Encounter Medications as of 05/03/2021  Medication Sig   Albuterol Sulfate 108 (90 Base) MCG/ACT AEPB Inhale 1-2 puffs into the lungs 4 (four) times daily as needed (wheezing/shortness of breath.).    aspirin EC 81 MG tablet Take 1 tablet (81 mg total) by mouth daily.   atorvastatin (LIPITOR) 80 MG tablet Take 40 mg by mouth daily with supper.    calcium carbonate (OS-CAL) 1250 (500 Ca) MG chewable tablet CHEW 1-2 TABLETS BY MOUTH AT BEDTIME AS NEEDED FOR INDIGESTION   Carboxymethylcellulose Sod PF 0.25 % SOLN Place 1 drop into both eyes 4 (four) times daily as needed (dry/irritated eyes.).    carvedilol (COREG) 3.125 MG tablet Take 1 tablet (3.125 mg total) by mouth 2 (two) times daily with a meal.   Cholecalciferol 25 MCG (1000 UT) tablet Take 1 tablet by mouth daily.   clobetasol ointment (TEMOVATE) [id number] % Apply 1 application topically 2 (two) times daily as needed (psoriasis).    ezetimibe (ZETIA) 10 MG tablet Take 1 tablet by mouth daily.   fluticasone  (CUTIVATE) 0.05 % cream Apply 1 application topically 2 (two) times daily as needed (psoriasis (face & groin areas)).    ketoconazole (NIZORAL) 2 % shampoo Apply 1 application topically 2 (two) times a week. Applied to facial hair (beard) & scalp. Leave on 10 minutes prior to washing out.   losartan (COZAAR) 50 MG tablet Take 50 mg by mouth 2 (two) times daily.   metFORMIN (GLUCOPHAGE) 1000 MG tablet Take 500 mg by mouth 2 (two) times daily.   nitroGLYCERIN (NITROSTAT) 0.4 MG SL tablet Place 1 tablet (0.4 mg total) under the tongue every 5 (five) minutes as needed for chest pain.   umeclidinium-vilanterol (ANORO ELLIPTA) 62.5-25 MCG/INH AEPB Inhale 1 puff into the lungs daily.   Ustekinumab (STELARA [place]) Inject 1 Dose into the skin every 3 (three) months. Every 12 weeks   No facility-administered encounter medications on file as of 05/03/2021.    Allergies (verified) Sulfa antibiotics   History: Past Medical History:  Diagnosis Date   CAD (coronary artery disease)    a. anterior ST elevation MI in 09/2015. Cardiac cath showed occluded prox LAD and 60% ostial RCA stenosis. There was also very distal disease affecting the LAD and ramus. He underwent successful angioplasty and drug-eluting stent placement to the LAD. Ejection fraction was 35-40% which improved subsequently an echo to 55-60%.   Chronic obstructive pulmonary disease, unspecified (HCC)    COPD (chronic obstructive  pulmonary disease) ([place]) 03/2016   4th stage-Dr. Luan Pulling   Essential (primary) hypertension    Hyperlipidemia    Ischemic cardiomyopathy    a. EF by cath 09/13/2015: 35-40%; b. 09/14/2015: EF 55-60%, Probable akinesis of  the midanteroseptal myocardium. akinesis of the apicalanterior myocardium, GR1DD   MI (myocardial infarction) ([place])    Psoriasis    Psoriasis, unspecified    ST elevation (STEMI) myocardial infarction involving left anterior descending coronary artery ([place])  09/2015 09/13/2015   Tobacco use     Vitreous floaters of both eyes    Past Surgical History:  Procedure Laterality Date   CARDIAC CATHETERIZATION N/A 09/13/2015   Procedure: Left Heart Cath and Coronary Angiography;  Surgeon: Wellington Hampshire, MD;  Location: [place] [facility];  Service: Cardiovascular;  Laterality: N/A;   CARDIAC CATHETERIZATION N/A 09/13/2015   Procedure: Coronary Stent Intervention;  Surgeon: Wellington Hampshire, MD;  Location: [place] [facility];  Service: Cardiovascular;  Laterality: N/A;   CARDIAC CATHETERIZATION N/A 11/03/2015   Procedure: Left Heart Cath and Coronary Angiography;  Surgeon: Peter M Martinique, MD;  Location: [place] [facility];  Service: Cardiovascular;  Laterality: N/A;   COLONOSCOPY N/A 01/29/2016   Procedure: COLONOSCOPY;  Surgeon: Danie Binder, MD;  [facility];  Service: Endoscopy;  Laterality: N/A;  1030-moved to 1215 office to notify    COLONOSCOPY N/A 02/28/2020   Procedure: COLONOSCOPY;  Surgeon: Daneil Dolin, MD;  [facility];  Service: Endoscopy;  Laterality: N/A;  9:30   ESOPHAGOGASTRODUODENOSCOPY N/A 01/29/2016   Procedure: ESOPHAGOGASTRODUODENOSCOPY (EGD);  Surgeon: Danie Binder, MD;  [facility];  Service: Endoscopy;  Laterality: N/A;   POLYPECTOMY  02/28/2020   Procedure: POLYPECTOMY;  Surgeon: Daneil Dolin, MD;  [facility];  Service: Endoscopy;;   Family History  Problem Relation Age of Onset   Heart Problems Mother    Heart attack Father    Heart disease Father    Cancer Other    Colon cancer Maternal Aunt    Social History   Socioeconomic History   Marital status: Married    Spouse name: Not on file   Number of children: Not on file   Years of education: Not on file   Highest education level: Not on file  Occupational History   Not on file  Tobacco Use   Smoking status: Former    Years: 40.00    Types: Cigarettes    Quit date: 09/14/2015    Years since quitting: 5.6   Smokeless tobacco: Never   Substance and Sexual Activity   Alcohol use: No    Alcohol/week: 0.0 standard drinks    Comment: Previously heavy beer drinker; quit 20 years ago   Drug use: No   Sexual activity: Never  Other Topics Concern   Not on file  Social History Narrative   Not on file   Social Determinants of Health   Financial Resource Strain: Not on file  Food Insecurity: Not on file  Transportation Needs: Not on file  Physical Activity: Not on file  Stress: Not on file  Social Connections: Not on file    Tobacco Counseling Counseling given: Not Answered   Clinical Intake:                 Diabetic? Yes         Activities of Daily Living In your present state of health, do you have any difficulty performing the following  activities: 02/18/2021  Hearing? N  Vision? N  Difficulty concentrating or making decisions? N  Walking or climbing stairs? Y  Dressing or bathing? N  Doing errands, shopping? N  Some recent data might be hidden    Patient Care Team: Lindell Spar, MD as PCP - General (Internal Medicine) Danie Binder, MD (Inactive) as Consulting Physician (Gastroenterology) Lenise Arena, FNP (Family Medicine)  Indicate any recent Medical Services you may have received from other than [facility] providers in the past year (date may be approximate).     Assessment:   This is a routine wellness examination for [place].  Hearing/Vision screen No results found.  Dietary issues and exercise activities discussed:     Goals Addressed   None   Depression Screen PHQ 2/9 Scores 02/18/2021 04/25/2016 12/22/2015  PHQ - 2 Score 0 0 0  PHQ- 9 Score - 0 0    Fall Risk Fall Risk  02/18/2021 12/22/2015  Falls in the past year? 0 No  Number falls in past yr: 0 -  Injury with Fall? 0 -  Risk for fall due to : No Fall Risks -  Follow up Falls evaluation completed -    FALL RISK PREVENTION PERTAINING TO THE HOME:  Any stairs in or around the home? Yes  If so, are there any  without handrails? Yes  Home free of loose throw rugs in walkways, pet beds, electrical cords, etc? Yes  Adequate lighting in your home to reduce risk of falls? Yes   ASSISTIVE DEVICES UTILIZED TO PREVENT FALLS:  Life alert? No  Use of a cane, walker or w/c? No  Grab bars in the bathroom? Yes  Shower chair or bench in shower? Yes  Elevated toilet seat or a handicapped toilet? Yes   TIMED UP AND GO:  Was the test performed? Yes .  Length of time to ambulate 10 feet: 20 sec.   Gait steady and fast without use of assistive device  Cognitive Function:        Immunizations Immunization History  Administered Date(s) Administered   Fluad Quad(high Dose 65+) 07/03/2020   Influenza Inj Mdck Quad Pf 08/11/2016   Influenza, High Dose Seasonal PF 09/05/2016   Influenza, Seasonal, Injecte, Preservative Fre 06/18/2019   Influenza,inj,Quad PF,6+ Mos 07/07/2017, 07/02/2018, 06/18/2019   Influenza-Unspecified 06/18/2019   Moderna Sars-Covid-2 Vaccination 11/07/2019, 12/05/2019, 08/04/2020, 01/12/2021   Pneumococcal Conjugate-13 06/07/2016   Pneumococcal Polysaccharide-23 07/07/2017   Td (Adult) 06/07/2016   Tdap 11/03/2004   Zoster Recombinat (Shingrix) 10/04/2019, 03/20/2020    TDAP status: Due, Education has been provided regarding the importance of this vaccine. Advised may receive this vaccine at local pharmacy or Health Dept. Aware to provide a copy of the vaccination record if obtained from local pharmacy or Health Dept. Verbalized acceptance and understanding.  Flu Vaccine status: Due, Education has been provided regarding the importance of this vaccine. Advised may receive this vaccine at local pharmacy or Health Dept. Aware to provide a copy of the vaccination record if obtained from local pharmacy or Health Dept. Verbalized acceptance and understanding.  Pneumococcal vaccine status: Up to date  Covid-19 vaccine status: Completed vaccines  Qualifies for Shingles Vaccine? Yes    Zostavax completed Yes   Shingrix Completed?: Yes  Screening Tests Health Maintenance  Topic Date Due   OPHTHALMOLOGY EXAM  Never done   Hepatitis C Screening  Never done   TETANUS/TDAP  11/03/2014   INFLUENZA VACCINE  05/03/2021   HEMOGLOBIN A1C  06/05/2021  FOOT EXAM  02/18/2022   PNA vac Low Risk Adult (2 of 2 - PPSV23) 07/07/2022   COLONOSCOPY (Pts 45-35yr Insurance coverage will need to be confirmed)  02/27/2030   COVID-19 Vaccine  Completed   Zoster Vaccines- Shingrix  Completed   HPV VACCINES  Aged Out    Health Maintenance  Health Maintenance Due  Topic Date Due   OPHTHALMOLOGY EXAM  Never done   Hepatitis C Screening  Never done   TETANUS/TDAP  11/03/2014   INFLUENZA VACCINE  05/03/2021    Colorectal cancer screening: Type of screening: Colonoscopy. Completed normal. Repeat every 10 years  Lung Cancer Screening: (Low Dose CT Chest recommended if Age 67-80years, 30 pack-year currently smoking OR have quit w/in 15years.) does not qualify.    Additional Screening:  Hepatitis C Screening: does qualify.   Vision Screening: Recommended annual ophthalmology exams for early detection of glaucoma and other disorders of the eye. Is the patient up to date with their annual eye exam?  Yes  Who is the provider or what is the name of the office in which the patient attends annual eye exams? [facility] If pt is not established with a provider, would they like to be referred to a provider to establish care? No .   Dental Screening: Recommended annual dental exams for proper oral hygiene  Community Resource Referral / Chronic Care Management: CRR required this visit?  No   CCM required this visit?  No      Plan:     I have personally reviewed and noted the following in the patient's chart:   Medical and social history Use of alcohol, tobacco or illicit drugs  Current medications and supplements including opioid prescriptions. Patient is not  currently taking opioid prescriptions. Functional ability and status Nutritional status Physical activity Advanced directives List of other physicians Hospitalizations, surgeries, and ER visits in previous 12 months Vitals Screenings to include cognitive, depression, and falls Referrals and appointments  In addition, I have reviewed and discussed with patient certain preventive protocols, quality metrics, and best practice recommendations. A written personalized care plan for preventive services as well as general preventive health recommendations were provided to patient.     ALonn Georgia LPN   [id number]  Nurse Notes: AWV conducted in office face to face with provider on site, with pt consent to annual wellness visit. This visit took approx 20 min.

## 2021-05-05 ENCOUNTER — Ambulatory Visit: Payer: Medicare HMO

## 2021-06-24 LAB — HEPATIC FUNCTION PANEL
ALT: 151 — AB (ref 10–40)
AST: 59 — AB (ref 14–40)

## 2021-06-24 LAB — BASIC METABOLIC PANEL
BUN: 17 (ref 4–21)
Creatinine: 1 (ref 0.6–1.3)
Glucose: 87

## 2021-06-24 LAB — TSH: TSH: 2.69 (ref 0.41–5.90)

## 2021-06-24 LAB — MICROALBUMIN, URINE: Microalb, Ur: 13.5

## 2021-06-24 LAB — LIPID PANEL
LDL Cholesterol: 43
Triglycerides: 54 (ref 40–160)

## 2021-06-24 LAB — COMPREHENSIVE METABOLIC PANEL
Calcium: 9.4 (ref 8.7–10.7)
GFR calc Af Amer: 81

## 2021-06-24 LAB — HEMOGLOBIN A1C: Hemoglobin A1C: 6.3

## 2021-07-16 ENCOUNTER — Other Ambulatory Visit: Payer: Self-pay

## 2021-07-19 ENCOUNTER — Ambulatory Visit
Admission: EM | Admit: 2021-07-19 | Discharge: 2021-07-19 | Disposition: A | Discharge status: Home / Self Care | Payer: Medicare HMO | Attending: Student | Admitting: Student

## 2021-07-19 ENCOUNTER — Ambulatory Visit: Payer: Medicare HMO

## 2021-07-19 ENCOUNTER — Encounter: Payer: Self-pay | Admitting: Emergency Medicine

## 2021-07-19 DIAGNOSIS — J42 Unspecified chronic bronchitis: Secondary | ICD-10-CM

## 2021-07-19 DIAGNOSIS — J209 Acute bronchitis, unspecified: Secondary | ICD-10-CM

## 2021-07-19 MED ORDER — PROMETHAZINE-DM 6.25-15 MG/5ML PO SYRP
5.0000 mL | ORAL_SOLUTION | Freq: Four times a day (QID) | ORAL | 0 refills | Status: DC | PRN
Start: 2021-07-19 — End: 2021-08-16

## 2021-07-19 MED ORDER — PREDNISONE 10 MG (21) PO TBPK
ORAL_TABLET | Freq: Every day | ORAL | 0 refills | Status: DC
Start: 2021-07-19 — End: 2021-08-16

## 2021-07-19 NOTE — ED Provider Notes (Signed)
[facility]    CSN: [id number] Arrival date & time: 07/19/21  6440      History   Chief Complaint No chief complaint on file.   HPI Grant Cardenas is a 65 y.o. male presenting with almost 2 weeks of cough.  Medical history COPD, CAD, STEMI, diabetes. Using Anoro Ellipta and albuterol inhalers. Here today with wife who helps provide history.  Describes cough as hacking and worse at night, occasionally productive of dark yellow sputum.  Albuterol inhaler helps somewhat.  Took some of his wife's cough syrup with codeine which helped a lot.  Denies fever/chills, shortness of breath. Initially with sore throat but no longer.  HPI  Past Medical History:  Diagnosis Date   CAD (coronary artery disease)    a. anterior ST elevation MI in 09/2015. Cardiac cath showed occluded prox LAD and 60% ostial RCA stenosis. There was also very distal disease affecting the LAD and ramus. He underwent successful angioplasty and drug-eluting stent placement to the LAD. Ejection fraction was 35-40% which improved subsequently an echo to 55-60%.   Chronic obstructive pulmonary disease, unspecified (HCC)    COPD (chronic obstructive pulmonary disease) ([place]) 03/2016   4th stage-Dr. Luan Pulling   Essential (primary) hypertension    Hyperlipidemia    Ischemic cardiomyopathy    a. EF by cath 09/13/2015: 35-40%; b. 09/14/2015: EF 55-60%, Probable akinesis of  the midanteroseptal myocardium. akinesis of the apicalanterior myocardium, GR1DD   MI (myocardial infarction) ([place])    Psoriasis    Psoriasis, unspecified    ST elevation (STEMI) myocardial infarction involving left anterior descending coronary artery ([place])  09/2015 09/13/2015   Tobacco use    Vitreous floaters of both eyes     Patient Active Problem List   Diagnosis Date Noted   Age-related nuclear cataract, bilateral 02/18/2021   Anxiety 02/18/2021   Atherosclerotic heart disease of native coronary artery without angina pectoris  02/18/2021   Chronic periodontitis, generalized, unspecified severity 02/18/2021   Solitary pulmonary nodule 02/18/2021   Pleural thickening 02/18/2021   Type 2 diabetes mellitus without complications ([place]) 34/74/2595   Essential (primary) hypertension    Chronic obstructive lung disease ([place])    Coronary artery disease involving native coronary artery with angina pectoris ([place]) 09/14/2016   Hyperlipidemia 09/14/2016   Tubular adenoma of colon 01/29/2016   GERD (gastroesophageal reflux disease) 12/16/2015   Pain in the chest non cardiac, may be GI or Muscular skeletal.     Cardiomyopathy, ischemic 09/23/2015   Psoriasis 08/18/2015    Past Surgical History:  Procedure Laterality Date   CARDIAC CATHETERIZATION N/A 09/13/2015   Procedure: Left Heart Cath and Coronary Angiography;  Surgeon: Wellington Hampshire, MD;  Location: [facility];  Service: Cardiovascular;  Laterality: N/A;   CARDIAC CATHETERIZATION N/A 09/13/2015   Procedure: Coronary Stent Intervention;  Surgeon: Wellington Hampshire, MD;  Location: [facility];  Service: Cardiovascular;  Laterality: N/A;   CARDIAC CATHETERIZATION N/A 11/03/2015   Procedure: Left Heart Cath and Coronary Angiography;  Surgeon: Peter M Martinique, MD;  Location: [facility];  Service: Cardiovascular;  Laterality: N/A;   COLONOSCOPY N/A 01/29/2016   Procedure: COLONOSCOPY;  Surgeon: Danie Binder, MD;  [facility];  Service: Endoscopy;  Laterality: N/A;  1030-moved to 1215 office to notify    COLONOSCOPY N/A 02/28/2020   Procedure: COLONOSCOPY;  Surgeon: Daneil Dolin, MD;  [facility];  Service: Endoscopy;  Laterality: N/A;  9:30  ESOPHAGOGASTRODUODENOSCOPY N/A 01/29/2016   Procedure: ESOPHAGOGASTRODUODENOSCOPY (EGD);  Surgeon: Danie Binder, MD;  [facility];  Service: Endoscopy;  Laterality: N/A;   POLYPECTOMY  02/28/2020   Procedure: POLYPECTOMY;  Surgeon: Daneil Dolin, MD;  [facility];  Service: Endoscopy;;       Home Medications    Prior to Admission medications   Medication Sig Start Date End Date Taking? Authorizing Provider  predniSONE (STERAPRED UNI-PAK 21 TAB) 10 MG (21) TBPK tablet Take by mouth daily. Take 6 tabs by mouth daily  for 2 days, then 5 tabs for 2 days, then 4 tabs for 2 days, then 3 tabs for 2 days, 2 tabs for 2 days, then 1 tab by mouth daily for 2 days 07/19/21  Yes Hazel Sams, PA-C  promethazine-dextromethorphan (PROMETHAZINE-DM) 6.25-15 MG/5ML syrup Take 5 mLs by mouth 4 (four) times daily as needed for cough. 07/19/21  Yes Hazel Sams, PA-C  Albuterol Sulfate 108 (90 Base) MCG/ACT AEPB Inhale 1-2 puffs into the lungs 4 (four) times daily as needed (wheezing/shortness of breath.).     [provider]  aspirin EC 81 MG tablet Take 1 tablet (81 mg total) by mouth daily. 09/23/15   Wellington Hampshire, MD  atorvastatin (LIPITOR) 80 MG tablet Take 40 mg by mouth daily with supper.  01/10/20   [provider]  calcium carbonate (OS-CAL) 1250 (500 Ca) MG chewable tablet CHEW 1-2 TABLETS BY MOUTH AT BEDTIME AS NEEDED FOR INDIGESTION 04/14/20   [provider]  Carboxymethylcellulose Sod PF 0.25 % SOLN Place 1 drop into both eyes 4 (four) times daily as needed (dry/irritated eyes.).     [provider]  carvedilol (COREG) 3.125 MG tablet Take 1 tablet (3.125 mg total) by mouth 2 (two) times daily with a meal. 09/08/16   Wellington Hampshire, MD  Cholecalciferol 25 MCG (1000 UT) tablet Take 1 tablet by mouth daily. 07/06/20   [provider]  clobetasol ointment (TEMOVATE) 5.62 % Apply 1 application topically 2 (two) times daily as needed (psoriasis).  10/25/15   [provider]  ezetimibe (ZETIA) 10 MG tablet Take 1 tablet by mouth daily. 07/03/20   [provider]  fluticasone (CUTIVATE) 0.05 % cream Apply 1 application topically 2 (two) times daily as needed (psoriasis (face & groin areas)).   05/07/19   [provider]  ketoconazole (NIZORAL) 2 % shampoo Apply 1 application topically 2 (two) times a week. Applied to facial hair (beard) & scalp. Leave on 10 minutes prior to washing out.    [provider]  losartan (COZAAR) 50 MG tablet Take 50 mg by mouth 2 (two) times daily. 04/17/20   [provider]  metFORMIN (GLUCOPHAGE) 1000 MG tablet Take 500 mg by mouth 2 (two) times daily. 12/03/20   [provider]  nitroGLYCERIN (NITROSTAT) 0.4 MG SL tablet Place 1 tablet (0.4 mg total) under the tongue every 5 (five) minutes as needed for chest pain. 09/16/15   Bhagat, Crista Luria, PA  umeclidinium-vilanterol (ANORO ELLIPTA) 62.5-25 MCG/INH AEPB Inhale 1 puff into the lungs daily.    [provider]  Ustekinumab (STELARA [place]) Inject 1 Dose into the skin every 3 (three) months. Every 12 weeks    [provider]    Family History Family History  Problem Relation Age of Onset   Heart Problems Mother    Heart attack Father    Heart disease Father    Cancer Other  Colon cancer Maternal Aunt     Social History Social History   Tobacco Use   Smoking status: Former    Years: 40.00    Types: Cigarettes    Quit date: 09/14/2015    Years since quitting: 5.8   Smokeless tobacco: Never  Substance Use Topics   Alcohol use: No    Alcohol/week: 0.0 standard drinks    Comment: Previously heavy beer drinker; quit 20 years ago   Drug use: No     Allergies   Sulfa antibiotics   Review of Systems Review of Systems  Constitutional:  Negative for appetite change, chills and fever.  HENT:  Positive for congestion. Negative for ear pain, rhinorrhea, sinus pressure, sinus pain and sore throat.   Eyes:  Negative for redness and visual disturbance.  Respiratory:  Positive for cough. Negative for chest tightness, shortness of breath and wheezing.   Cardiovascular:  Negative for chest pain and palpitations.  Gastrointestinal:  Negative for  abdominal pain, constipation, diarrhea, nausea and vomiting.  Genitourinary:  Negative for dysuria, frequency and urgency.  Musculoskeletal:  Negative for myalgias.  Neurological:  Negative for dizziness, weakness and headaches.  Psychiatric/Behavioral:  Negative for confusion.   All other systems reviewed and are negative.   Physical Exam Triage Vital Signs ED Triage Vitals  Enc Vitals Group     BP 07/19/21 0903 98/64     Pulse Rate 07/19/21 0903 68     Resp 07/19/21 0903 18     Temp 07/19/21 0903 98.2 F (36.8 C)     Temp Source 07/19/21 0903 Oral     SpO2 07/19/21 0903 97 %     Weight --      Height --      Head Circumference --      Peak Flow --      Pain Score 07/19/21 0904 0     Pain Loc --      Pain Edu? --      Excl. in [place]? --    No data found.  Updated Vital Signs BP 98/64 (BP Location: Right Arm)   Pulse 68   Temp 98.2 F (36.8 C) (Oral)   Resp 18   SpO2 97%   Visual Acuity Right Eye Distance:   Left Eye Distance:   Bilateral Distance:    Right Eye Near:   Left Eye Near:    Bilateral Near:     Physical Exam Vitals reviewed.  Constitutional:      General: He is not in acute distress.    Appearance: Normal appearance. He is not ill-appearing.  HENT:     Head: Normocephalic and atraumatic.     Right Ear: Tympanic membrane, ear canal and external ear normal. No tenderness. No middle ear effusion. There is no impacted cerumen. Tympanic membrane is not perforated, erythematous, retracted or bulging.     Left Ear: Tympanic membrane, ear canal and external ear normal. No tenderness.  No middle ear effusion. There is no impacted cerumen. Tympanic membrane is not perforated, erythematous, retracted or bulging.     Nose: Nose normal. No congestion.     Mouth/Throat:     Mouth: Mucous membranes are moist.     Pharynx: Uvula midline. No oropharyngeal exudate or posterior oropharyngeal erythema.  Eyes:     Extraocular Movements: Extraocular movements intact.      Pupils: Pupils are equal, round, and reactive to light.  Cardiovascular:     Rate and Rhythm: Normal rate and regular rhythm.  Heart sounds: Normal heart sounds.  Pulmonary:     Effort: Pulmonary effort is normal.     Breath sounds: Normal breath sounds. No decreased breath sounds, wheezing, rhonchi or rales.     Comments: Occ cough Abdominal:     Palpations: Abdomen is soft.     Tenderness: There is no abdominal tenderness. There is no guarding or rebound.  Neurological:     General: No focal deficit present.     Mental Status: He is alert and oriented to person, place, and time.  Psychiatric:        Mood and Affect: Mood normal.        Behavior: Behavior normal.        Thought Content: Thought content normal.        Judgment: Judgment normal.     UC Treatments / Results  Labs (all labs ordered are listed, but only abnormal results are displayed) Labs Reviewed - No data to display  EKG   Radiology No results found.  Procedures Procedures (including critical care time)  Medications Ordered in UC Medications - No data to display  Initial Impression / Assessment and Plan / UC Course  I have reviewed the triage vital signs and the nursing notes.  Pertinent labs & imaging results that were available during my care of the patient were reviewed by me and considered in my medical decision making (see chart for details).     This patient is a very pleasant 65 y.o. year old male presenting with acute bronchitis following viral URI. Today this pt is afebrile nontachycardic nontachypneic, oxygenating well on room air, no wheezes rhonchi or rales. History COPD, continue albuterol and steroid inhalers.  Given duration of symptoms did not send covid test. Prednisone taper, promethazine DM.  ED return precautions discussed. Patient and wife verbalizes understanding and agreement.     Final Clinical Impressions(s) / UC Diagnoses   Final diagnoses:  Acute bronchitis,  unspecified organism  Chronic bronchitis, unspecified chronic bronchitis type ([place])     Discharge Instructions      -Prednisone, 2 pills taken at the same time for 5 days in a row.  Try taking this earlier in the day as it can give you energy. Avoid NSAIDs like ibuprofen and alleve while taking this medication as they can increase your risk of stomach upset and even GI bleeding when in combination with a steroid. You can continue tylenol (acetaminophen) up to 1000mg  3x daily. -Promethazine DM cough syrup for congestion/cough. This could make you drowsy, so take at night before bed. -Come back if symptoms getting worse instead of better     ED Prescriptions     Medication Sig Dispense Auth. Provider   predniSONE (STERAPRED UNI-PAK 21 TAB) 10 MG (21) TBPK tablet Take by mouth daily. Take 6 tabs by mouth daily  for 2 days, then 5 tabs for 2 days, then 4 tabs for 2 days, then 3 tabs for 2 days, 2 tabs for 2 days, then 1 tab by mouth daily for 2 days 42 tablet Hazel Sams, PA-C   promethazine-dextromethorphan (PROMETHAZINE-DM) 6.25-15 MG/5ML syrup Take 5 mLs by mouth 4 (four) times daily as needed for cough. 118 mL Hazel Sams, PA-C      PDMP not reviewed this encounter.   Hazel Sams, PA-C 07/19/21 [phone]

## 2021-07-19 NOTE — Discharge Instructions (Addendum)
-  Prednisone, 2 pills taken at the same time for 5 days in a row.  Try taking this earlier in the day as it can give you energy. Avoid NSAIDs like ibuprofen and alleve while taking this medication as they can increase your risk of stomach upset and even GI bleeding when in combination with a steroid. You can continue tylenol (acetaminophen) up to 1000mg  3x daily. -Promethazine DM cough syrup for congestion/cough. This could make you drowsy, so take at night before bed. -Come back if symptoms getting worse instead of better

## 2021-07-19 NOTE — ED Triage Notes (Signed)
10/6 had sore throat and chills.  Has Productive cough with chills off and on.

## 2021-08-16 ENCOUNTER — Ambulatory Visit (INDEPENDENT_AMBULATORY_CARE_PROVIDER_SITE_OTHER): Payer: Medicare HMO | Admitting: Nurse Practitioner

## 2021-08-16 ENCOUNTER — Other Ambulatory Visit: Payer: Self-pay

## 2021-08-16 ENCOUNTER — Encounter: Payer: Self-pay | Admitting: Nurse Practitioner

## 2021-08-16 VITALS — BP 128/67 | HR 60 | Ht 73.0 in | Wt 177.4 lb

## 2021-08-16 DIAGNOSIS — E1165 Type 2 diabetes mellitus with hyperglycemia: Secondary | ICD-10-CM | POA: Diagnosis not present

## 2021-08-16 LAB — POCT GLYCOSYLATED HEMOGLOBIN (HGB A1C): HbA1c, POC (controlled diabetic range): 6.2 % (ref 0.0–7.0)

## 2021-08-16 LAB — POCT UA - MICROALBUMIN
Albumin/Creatinine Ratio, Urine, POC: <30
Creatinine, POC: 300 mg/dL
Microalbumin Ur, POC: 10 mg/L

## 2021-08-16 NOTE — Progress Notes (Signed)
Endocrinology Follow Up Note       08/16/2021, 11:40 AM   Subjective:    Patient ID: Grant Cardenas, male    DOB: [birth year].  Grant Cardenas is being seen in follow up after being seen in consultation for management of currently uncontrolled symptomatic diabetes requested by  Lindell Spar, MD.   Past Medical History:  Diagnosis Date   CAD (coronary artery disease)    a. anterior ST elevation MI in 09/2015. Cardiac cath showed occluded prox LAD and 60% ostial RCA stenosis. There was also very distal disease affecting the LAD and ramus. He underwent successful angioplasty and drug-eluting stent placement to the LAD. Ejection fraction was 35-40% which improved subsequently an echo to 55-60%.   Chronic obstructive pulmonary disease, unspecified (HCC)    COPD (chronic obstructive pulmonary disease) ([place]) 03/2016   4th stage-Dr. Luan Pulling   Essential (primary) hypertension    Hyperlipidemia    Ischemic cardiomyopathy    a. EF by cath 09/13/2015: 35-40%; b. 09/14/2015: EF 55-60%, Probable akinesis of  the midanteroseptal myocardium. akinesis of the apicalanterior myocardium, [id number]   MI (myocardial infarction) ([place])    Psoriasis    Psoriasis, unspecified    ST elevation (STEMI) myocardial infarction involving left anterior descending coronary artery ([place])  09/2015 09/13/2015   Tobacco use    Vitreous floaters of both eyes     Past Surgical History:  Procedure Laterality Date   CARDIAC CATHETERIZATION N/A 09/13/2015   Procedure: Left Heart Cath and Coronary Angiography;  Surgeon: Wellington Hampshire, MD;  Location: [facility];  Service: Cardiovascular;  Laterality: N/A;   CARDIAC CATHETERIZATION N/A 09/13/2015   Procedure: Coronary Stent Intervention;  Surgeon: Wellington Hampshire, MD;  Location: [facility];  Service: Cardiovascular;  Laterality: N/A;   CARDIAC CATHETERIZATION N/A 11/03/2015    Procedure: Left Heart Cath and Coronary Angiography;  Surgeon: Peter M Martinique, MD;  Location: [facility];  Service: Cardiovascular;  Laterality: N/A;   COLONOSCOPY N/A 01/29/2016   Procedure: COLONOSCOPY;  Surgeon: Danie Binder, MD;  [facility];  Service: Endoscopy;  Laterality: N/A;  1030-moved to 1215 office to notify    COLONOSCOPY N/A 02/28/2020   Procedure: COLONOSCOPY;  Surgeon: Daneil Dolin, MD;  [facility];  Service: Endoscopy;  Laterality: N/A;  9:30   ESOPHAGOGASTRODUODENOSCOPY N/A 01/29/2016   Procedure: ESOPHAGOGASTRODUODENOSCOPY (EGD);  Surgeon: Danie Binder, MD;  [facility];  Service: Endoscopy;  Laterality: N/A;   POLYPECTOMY  02/28/2020   Procedure: POLYPECTOMY;  Surgeon: Daneil Dolin, MD;  [facility];  Service: Endoscopy;;    Social History   Socioeconomic History   Marital status: Married    Spouse name: Not on file   Number of children: Not on file   Years of education: Not on file   Highest education level: Not on file  Occupational History   Not on file  Tobacco Use   Smoking status: Former    Years: 40.00    Types: Cigarettes    Quit date: 09/14/2015    Years since quitting: 5.9   Smokeless tobacco: Never  Vaping  Use   Vaping Use: Former  Substance and Sexual Activity   Alcohol use: No    Alcohol/week: 0.0 standard drinks    Comment: Previously heavy beer drinker; quit 20 years ago   Drug use: No   Sexual activity: Never  Other Topics Concern   Not on file  Social History Narrative   Not on file   Social Determinants of Health   Financial Resource Strain: Low Risk    Difficulty of Paying Living Expenses: Not very hard  Food Insecurity: No Food Insecurity   Worried About Charity fundraiser in the Last Year: Never true   Ran Out of Food in the Last Year: Never true  Transportation Needs: No Transportation Needs   Lack of Transportation (Medical): No   Lack of Transportation  (Non-Medical): No  Physical Activity: Sufficiently Active   Days of Exercise per Week: 5 days   Minutes of Exercise per Session: 60 min  Stress: No Stress Concern Present   Feeling of Stress : Not at all  Social Connections: Moderately Isolated   Frequency of Communication with Friends and Family: Never   Frequency of Social Gatherings with Friends and Family: Never   Attends Religious Services: More than 4 times per year   Active Member of [employer] or Organizations: No   Attends Archivist Meetings: Never   Marital Status: Married    Family History  Problem Relation Age of Onset   Heart Problems Mother    Heart attack Father    Heart disease Father    Cancer Other    Colon cancer Maternal Aunt     Outpatient Encounter Medications as of 08/16/2021  Medication Sig   Albuterol Sulfate 108 (90 Base) MCG/ACT AEPB Inhale 1-2 puffs into the lungs 4 (four) times daily as needed (wheezing/shortness of breath.).    aspirin EC 81 MG tablet Take 1 tablet (81 mg total) by mouth daily.   atorvastatin (LIPITOR) 40 MG tablet TAKE ONE TABLET BY MOUTH AT BEDTIME FOR CHOLESTEROL   calcium carbonate (OS-CAL) 1250 (500 Ca) MG chewable tablet CHEW 1-2 TABLETS BY MOUTH AT BEDTIME AS NEEDED FOR INDIGESTION   Carboxymethylcellulose Sod PF 0.25 % SOLN Place 1 drop into both eyes 4 (four) times daily as needed (dry/irritated eyes.).    carvedilol (COREG) 6.25 MG tablet TAKE ONE-HALF TABLET BY MOUTH TWICE A DAY *NOTE CHANGE IN STRENGTH AND DIRECTIONS*   Cholecalciferol 25 MCG (1000 UT) tablet Take 1 tablet by mouth daily.   clobetasol ointment (TEMOVATE) 8.41 % Apply 1 application topically 2 (two) times daily as needed (psoriasis).    ezetimibe (ZETIA) 10 MG tablet Take 1 tablet by mouth daily.   fluticasone (CUTIVATE) 0.05 % cream Apply 1 application topically 2 (two) times daily as needed (psoriasis (face & groin areas)).    ketoconazole (NIZORAL) 2 % shampoo Apply 1 application topically 2 (two)  times a week. Applied to facial hair (beard) & scalp. Leave on 10 minutes prior to washing out.   losartan (COZAAR) 50 MG tablet Take 50 mg by mouth 2 (two) times daily.   Menthol-Methyl Salicylate (THERA-GESIC) 0.5-15 % CREA APPLY MODERATE AMOUNT TO AFFECTED AREA TWICE A DAY   metFORMIN (GLUCOPHAGE) 500 MG tablet TAKE ONE TABLET BY MOUTH TWICE A DAY FOR DIABETES (ANNUAL KIDNEY FUNCTION TESTING IS NEEDED)   nitroGLYCERIN (NITROSTAT) 0.4 MG SL tablet Place 1 tablet (0.4 mg total) under the tongue every 5 (five) minutes as needed for chest pain.   umeclidinium-vilanterol [facility]  ELLIPTA) 62.5-25 MCG/INH AEPB Inhale 1 puff into the lungs daily.   Ustekinumab (STELARA [place]) Inject 1 Dose into the skin every 3 (three) months. Every 12 weeks   [DISCONTINUED] atorvastatin (LIPITOR) 80 MG tablet Take 40 mg by mouth daily with supper.  (Patient not taking: Reported on 08/16/2021)   [DISCONTINUED] carvedilol (COREG) 3.125 MG tablet Take 1 tablet (3.125 mg total) by mouth 2 (two) times daily with a meal. (Patient not taking: Reported on 08/16/2021)   [DISCONTINUED] metFORMIN (GLUCOPHAGE) 1000 MG tablet Take 500 mg by mouth 2 (two) times daily. (Patient not taking: Reported on 08/16/2021)   [DISCONTINUED] predniSONE (STERAPRED UNI-PAK 21 TAB) 10 MG (21) TBPK tablet Take by mouth daily. Take 6 tabs by mouth daily  for 2 days, then 5 tabs for 2 days, then 4 tabs for 2 days, then 3 tabs for 2 days, 2 tabs for 2 days, then 1 tab by mouth daily for 2 days (Patient not taking: Reported on 08/16/2021)   [DISCONTINUED] promethazine-dextromethorphan (PROMETHAZINE-DM) 6.25-15 MG/5ML syrup Take 5 mLs by mouth 4 (four) times daily as needed for cough. (Patient not taking: Reported on 08/16/2021)   No facility-administered encounter medications on file as of 08/16/2021.    ALLERGIES: Allergies  Allergen Reactions   Sulfa Antibiotics Other (See Comments)    Sulfa drugs (unaware of this reaction per patient spouse)       VACCINATION STATUS: Immunization History  Administered Date(s) Administered   Fluad Quad(high Dose 65+) 07/03/2020   Influenza Inj Mdck Quad Pf 08/11/2016   Influenza, High Dose Seasonal PF 09/05/2016   Influenza, Seasonal, Injecte, Preservative Fre 06/18/2019   Influenza,inj,Quad PF,6+ Mos 07/07/2017, 07/02/2018, 06/18/2019   Influenza-Unspecified 06/18/2019   Moderna Sars-Covid-2 Vaccination 11/07/2019, 12/05/2019, 08/04/2020, 01/12/2021   Pneumococcal Conjugate-13 06/07/2016   Pneumococcal Polysaccharide-23 07/07/2017   Td (Adult) 06/07/2016   Tdap 11/03/2004   Zoster Recombinat (Shingrix) 10/04/2019, 03/20/2020    Diabetes He presents for his follow-up diabetic visit. He has type 2 diabetes mellitus. Onset time: Diagnosed at approx age of 66. His disease course has been stable. There are no hypoglycemic associated symptoms. Pertinent negatives for diabetes include no fatigue and no polyuria. There are no hypoglycemic complications. Symptoms are stable. Diabetic complications include heart disease. Risk factors for coronary artery disease include diabetes mellitus, dyslipidemia, hypertension and male sex. Current diabetic treatment includes oral agent (monotherapy). He is compliant with treatment most of the time. His weight is stable. He is following a generally healthy diet. When asked about meal planning, he reported none. He has not had a previous visit with a dietitian. He participates in exercise intermittently. (He presents today with no meter or logs to review (does not have to routinely monitor due to monotherapy with Metformin alone).  His POCT A1c today is 6.2%, increasing slightly from last visit of 6%.  He denies any hypoglycemia.  He continues to stick to his diet and exercise regimen.) An ACE inhibitor/angiotensin II receptor blocker is being taken. He does not see a podiatrist.Eye exam is current.  Hyperlipidemia This is a chronic problem. The current episode started more  than 1 year ago. The problem is controlled. Recent lipid tests were reviewed and are normal. Exacerbating diseases include diabetes. Factors aggravating his hyperlipidemia include beta blockers. Current antihyperlipidemic treatment includes statins. The current treatment provides significant improvement of lipids. There are no compliance problems.  Risk factors for coronary artery disease include diabetes mellitus, dyslipidemia, hypertension and male sex.  Hypertension This is a chronic problem.  The current episode started more than 1 year ago. The problem has been resolved since onset. The problem is controlled. There are no associated agents to hypertension. Risk factors for coronary artery disease include diabetes mellitus, dyslipidemia and male gender. Past treatments include angiotensin blockers and beta blockers. The current treatment provides moderate improvement. There are no compliance problems.  Hypertensive end-organ damage includes CAD/MI.   Review of systems  Constitutional: + Minimally fluctuating body weight,  current Body mass index is 23.41 kg/m. , no fatigue, no subjective hyperthermia, no subjective hypothermia Eyes: no blurry vision, no xerophthalmia ENT: no sore throat, no nodules palpated in throat, no dysphagia/odynophagia, no hoarseness Cardiovascular: no chest pain, no shortness of breath, no palpitations, no leg swelling Respiratory: no cough, no shortness of breath Gastrointestinal: no nausea/vomiting/diarrhea Musculoskeletal: no muscle/joint aches Skin: no rashes, no hyperemia Neurological: no tremors, no numbness, no tingling, no dizziness Psychiatric: no depression, no anxiety  Objective:     BP 128/67   Pulse 60   Ht 6\' 1"  (1.854 m)   Wt 177 lb 6.4 oz (80.5 kg)   BMI 23.41 kg/m   Wt Readings from Last 3 Encounters:  08/16/21 177 lb 6.4 oz (80.5 kg)  05/03/21 172 lb (78 kg)  04/15/21 171 lb (77.6 kg)     BP Readings from Last 3 Encounters:  08/16/21  128/67  07/19/21 98/64  05/03/21 97/66     Physical Exam- Limited  Constitutional:  Body mass index is 23.41 kg/m. , not in acute distress, normal state of mind Eyes:  EOMI, no exophthalmos Neck: Supple Cardiovascular: RRR, no murmurs, rubs, or gallops, no edema Respiratory: Adequate breathing efforts, no crackles, rales, rhonchi, or wheezing Musculoskeletal: no gross deformities, strength intact in all four extremities, no gross restriction of joint movements Skin:  no rashes, no hyperemia Neurological: no tremor with outstretched hands    CMP ( most recent) CMP     Component Value Date/Time   NA 130 (L) 11/28/2020 1328   NA 140 11/08/2018 0000   K 4.5 11/28/2020 1328   CL 99 11/28/2020 1328   CO2 19 (L) 11/28/2020 1328   GLUCOSE 448 (H) 11/28/2020 1328   BUN 17 06/24/2021 0000   CREATININE 1.0 06/24/2021 0000   CREATININE 1.14 11/28/2020 1328   CALCIUM 9.4 06/24/2021 0000   PROT 7.7 11/28/2020 1328   PROT 7.2 09/23/2015 1540   ALBUMIN 4.4 11/28/2020 1328   ALBUMIN 4.4 09/23/2015 1540   AST 59 (A) 06/24/2021 0000   ALT 151 (A) 06/24/2021 0000   ALKPHOS 101 11/28/2020 1328   BILITOT 2.7 (H) 11/28/2020 1328   BILITOT 0.9 09/23/2015 1540   GFRNONAA >60 11/28/2020 1328   GFRAA 81 06/24/2021 0000     Diabetic Labs (most recent): Lab Results  Component Value Date   HGBA1C 6.2 08/16/2021   HGBA1C 6.3 06/24/2021   HGBA1C 11.5 12/03/2020     Lipid Panel ( most recent) Lipid Panel     Component Value Date/Time   CHOL 86 12/03/2020 0000   CHOL 212 (H) 09/23/2015 1540   TRIG 54 06/24/2021 0000   HDL 43 11/08/2018 0000   HDL 41 09/23/2015 1540   CHOLHDL 3.7 11/26/2015 0815   VLDL 17 11/26/2015 0815   LDLCALC 43 06/24/2021 0000   LDLCALC 137 (H) 09/23/2015 1540   LABVLDL 34 09/23/2015 1540      Lab Results  Component Value Date   TSH 2.69 06/24/2021   TSH 2.61 11/08/2018  Assessment & Plan:   1) Uncontrolled Type 2 Diabetes without  complications  He presents today with no meter or logs to review (does not have to routinely monitor due to monotherapy with Metformin alone).  His POCT A1c today is 6.2%, increasing slightly from last visit of 6%.  He denies any hypoglycemia.  He continues to stick to his diet and exercise regimen.  - Grant Cardenas has currently uncontrolled symptomatic type 2 DM since 65 years of age.   -Recent labs reviewed.  - I had a long discussion with him about the progressive nature of diabetes and the pathology behind its complications. -his diabetes is complicated by CAD (history of MI) and he remains at a high risk for more acute and chronic complications which include CVA, CKD, retinopathy, and neuropathy. These are all discussed in detail with him.  - Nutritional counseling repeated at each appointment due to patients tendency to fall back in to old habits.  - The patient admits there is a room for improvement in their diet and drink choices. -  Suggestion is made for the patient to avoid simple carbohydrates from their diet including Cakes, Sweet Desserts / Pastries, Ice Cream, Soda (diet and regular), Sweet Tea, Candies, Chips, Cookies, Sweet Pastries, Store Bought Juices, Alcohol in Excess of 1-2 drinks a day, Artificial Sweeteners, Coffee Creamer, and "Sugar-free" Products. This will help patient to have stable blood glucose profile and potentially avoid unintended weight gain.   - I encouraged the patient to switch to unprocessed or minimally processed complex starch and increased protein intake (animal or plant source), fruits, and vegetables.   - Patient is advised to stick to a routine mealtimes to eat 3 meals a day and avoid unnecessary snacks (to snack only to correct hypoglycemia).  - I have approached him with the following individualized plan to manage  his diabetes and patient agrees:   -Based on his stable glycemic profile, no changes will be made to his medications today.  He  can continue Metformin 500 mg po twice daily with meals.  He does not need to routinely monitor glucose due to monotherapy with Metformin alone.  - he is not a good candidate for incretin therapy due to body habitus.  - Specific targets for  A1c;  LDL, HDL,  and Triglycerides were discussed with the patient.  2) Blood Pressure /Hypertension:  his blood pressure is controlled to target.   he is advised to continue his current medications including Carvedilol 3.125 mg po twice daily and Losartan 50 mg p.o. daily with breakfast.  3) Lipids/Hyperlipidemia:    Review of his recent lipid panel from 06/24/21 showed controlled  LDL at 43 .  he  is advised to continue Lipitor 40 mg po daily at bedtime and Zetia 10 mg daily at bedtime.  Side effects and precautions discussed with him.  4)  Weight/Diet:  his Body mass index is 23.41 kg/m.  -  he is not a candidate for weight loss. Exercise, and detailed carbohydrates information provided  -  detailed on discharge instructions.  5) Chronic Care/Health Maintenance: -he is on ACEI/ARB and Statin medications and is encouraged to initiate and continue to follow up with Ophthalmology, Dentist, Podiatrist at least yearly or according to recommendations, and advised to stay away from smoking. I have recommended yearly flu vaccine and pneumonia vaccine at least every 5 years; moderate intensity exercise for up to 150 minutes weekly; and sleep for at least 7 hours a day.  -  he is advised to maintain close follow up with Lindell Spar, MD for primary care needs, as well as his other providers for optimal and coordinated care.     I spent 40 minutes in the care of the patient today including review of labs from [place], Lipids, Thyroid Function, Hematology (current and previous including abstractions from other facilities); face-to-face time discussing  his blood glucose readings/logs, discussing hypoglycemia and hyperglycemia episodes and symptoms, medications  doses, his options of short and long term treatment based on the latest standards of care / guidelines;  discussion about incorporating lifestyle medicine;  and documenting the encounter.    Please refer to Patient Instructions for Blood Glucose Monitoring and Insulin/Medications Dosing Guide"  in media tab for additional information. Please  also refer to " Patient Self Inventory" in the Media  tab for reviewed elements of pertinent patient history.  Grant Cardenas participated in the discussions, expressed understanding, and voiced agreement with the above plans.  All questions were answered to his satisfaction. he is encouraged to contact clinic should he have any questions or concerns prior to his return visit.   Follow up plan: - Return in about 6 months (around 02/13/2022) for Diabetes F/U with A1c in office, No previsit labs.  Rayetta Pigg, [facility] [address] [place], [place] [address] Phone: [phone] Fax: [phone]  08/16/2021, 11:40 AM

## 2021-08-16 NOTE — Patient Instructions (Signed)

## 2021-08-20 ENCOUNTER — Ambulatory Visit: Payer: Medicare HMO | Admitting: Internal Medicine

## 2021-09-28 ENCOUNTER — Ambulatory Visit: Payer: Medicare HMO | Admitting: Internal Medicine

## 2021-10-04 ENCOUNTER — Ambulatory Visit
Admission: EM | Admit: 2021-10-04 | Discharge: 2021-10-04 | Disposition: A | Discharge status: Home / Self Care | Payer: No Typology Code available for payment source | Attending: Urgent Care | Admitting: Urgent Care

## 2021-10-04 ENCOUNTER — Other Ambulatory Visit: Payer: Self-pay

## 2021-10-04 DIAGNOSIS — R052 Subacute cough: Secondary | ICD-10-CM | POA: Diagnosis not present

## 2021-10-04 DIAGNOSIS — R0981 Nasal congestion: Secondary | ICD-10-CM

## 2021-10-04 DIAGNOSIS — J069 Acute upper respiratory infection, unspecified: Secondary | ICD-10-CM | POA: Diagnosis not present

## 2021-10-04 DIAGNOSIS — Z1152 Encounter for screening for COVID-19: Secondary | ICD-10-CM

## 2021-10-04 DIAGNOSIS — E119 Type 2 diabetes mellitus without complications: Secondary | ICD-10-CM | POA: Diagnosis not present

## 2021-10-04 MED ORDER — BENZONATATE 100 MG PO CAPS: 100.0000 mg | ORAL_CAPSULE | Freq: Three times a day (TID) | ORAL | 0 refills | Status: DC | PRN

## 2021-10-04 MED ORDER — PROMETHAZINE-DM 6.25-15 MG/5ML PO SYRP: 5.0000 mL | ORAL_SOLUTION | Freq: Every evening | ORAL | 0 refills | Status: DC | PRN

## 2021-10-04 MED ORDER — CETIRIZINE HCL 10 MG PO TABS: 10.0000 mg | ORAL_TABLET | Freq: Every day | ORAL | 0 refills | Status: DC

## 2021-10-04 MED ORDER — IPRATROPIUM BROMIDE 0.03 % NA SOLN: 2.0000 | Freq: Two times a day (BID) | NASAL | 0 refills | Status: DC

## 2021-10-04 MED ORDER — PREDNISONE 10 MG PO TABS: 30.0000 mg | ORAL_TABLET | Freq: Every day | ORAL | 0 refills | Status: DC

## 2021-10-04 NOTE — ED Triage Notes (Signed)
Pt reports cough yellow, chills, congestion x 4 days.

## 2021-10-04 NOTE — Discharge Instructions (Signed)
We will notify you of your test results as they arrive and may take between 48-72 hours.  I encourage you to sign up for MyChart if you have not already done so as this can be the easiest way for [place] to communicate results to you online or through a phone app.  Generally, we only contact you if it is a positive test result.  In the meantime, if you develop worsening symptoms including fever, chest pain, shortness of breath despite our current treatment plan then please report to the emergency room as this may be a sign of worsening status from possible viral infection.  Otherwise, we will manage this as a viral syndrome. For sore throat or cough try using a honey-based tea. Use 3 teaspoons of honey with juice squeezed from half lemon. Place shaved pieces of ginger into 1/2-1 cup of water and warm over stove top. Then mix the ingredients and repeat every 4 hours as needed. Please take Tylenol 500mg -650mg  every 6 hours for aches and pains, fevers. Hydrate very well with at least 2 liters of water. Eat light meals such as soups to replenish electrolytes and soft fruits, veggies. Start an antihistamine like Zyrtec for postnasal drainage, sinus congestion.  You can take this together with prednisone (a steroid) in the context of your COPD.

## 2021-10-04 NOTE — ED Provider Notes (Signed)
[place]   MRN: [id number] DOB: September 17, 1956  Subjective:   Grant Cardenas is a 66 y.o. male presenting for 4-day history of acute onset sinus congestion, chills, productive cough.  No body aches, chest pain, shortness of breath or wheezing.  Patient does have a history of COPD but no longer smokes.  He is a type II diabetic and is well controlled, treated without insulin.  Has a history of heart disease as well, a prior MI and ischemic cardiomyopathy.  No current facility-administered medications for this encounter.  Current Outpatient Medications:    Albuterol Sulfate 108 (90 Base) MCG/ACT AEPB, Inhale 1-2 puffs into the lungs 4 (four) times daily as needed (wheezing/shortness of breath.). , Disp: , Rfl:    aspirin EC 81 MG tablet, Take 1 tablet (81 mg total) by mouth daily., Disp: 90 tablet, Rfl: 3   atorvastatin (LIPITOR) 40 MG tablet, TAKE ONE TABLET BY MOUTH AT BEDTIME FOR CHOLESTEROL, Disp: , Rfl:    calcium carbonate (OS-CAL) 1250 (500 Ca) MG chewable tablet, CHEW 1-2 TABLETS BY MOUTH AT BEDTIME AS NEEDED FOR INDIGESTION, Disp: , Rfl:    Carboxymethylcellulose Sod PF 0.25 % SOLN, Place 1 drop into both eyes 4 (four) times daily as needed (dry/irritated eyes.). , Disp: , Rfl:    carvedilol (COREG) 6.25 MG tablet, TAKE ONE-HALF TABLET BY MOUTH TWICE A DAY *NOTE CHANGE IN STRENGTH AND DIRECTIONS*, Disp: , Rfl:    Cholecalciferol 25 MCG (1000 UT) tablet, Take 1 tablet by mouth daily., Disp: , Rfl:    clobetasol ointment (TEMOVATE) 4.09 %, Apply 1 application topically 2 (two) times daily as needed (psoriasis). , Disp: , Rfl: 1   ezetimibe (ZETIA) 10 MG tablet, Take 1 tablet by mouth daily., Disp: , Rfl:    fluticasone (CUTIVATE) 0.05 % cream, Apply 1 application topically 2 (two) times daily as needed (psoriasis (face & groin areas)). , Disp: , Rfl:    ketoconazole (NIZORAL) 2 % shampoo, Apply 1 application topically 2 (two) times a week. Applied to facial hair  (beard) & scalp. Leave on 10 minutes prior to washing out., Disp: , Rfl:    losartan (COZAAR) 50 MG tablet, Take 50 mg by mouth 2 (two) times daily., Disp: , Rfl:    Menthol-Methyl Salicylate (THERA-GESIC) 0.5-15 % CREA, APPLY MODERATE AMOUNT TO AFFECTED AREA TWICE A DAY, Disp: , Rfl:    metFORMIN (GLUCOPHAGE) 500 MG tablet, TAKE ONE TABLET BY MOUTH TWICE A DAY FOR DIABETES (ANNUAL KIDNEY FUNCTION TESTING IS NEEDED), Disp: , Rfl:    nitroGLYCERIN (NITROSTAT) 0.4 MG SL tablet, Place 1 tablet (0.4 mg total) under the tongue every 5 (five) minutes as needed for chest pain., Disp: 25 tablet, Rfl: 12   umeclidinium-vilanterol (ANORO ELLIPTA) 62.5-25 MCG/INH AEPB, Inhale 1 puff into the lungs daily., Disp: , Rfl:    Ustekinumab (STELARA [place]), Inject 1 Dose into the skin every 3 (three) months. Every 12 weeks, Disp: , Rfl:    Allergies  Allergen Reactions   Sulfa Antibiotics Other (See Comments)    Sulfa drugs (unaware of this reaction per patient spouse)      Past Medical History:  Diagnosis Date   CAD (coronary artery disease)    a. anterior ST elevation MI in 09/2015. Cardiac cath showed occluded prox LAD and 60% ostial RCA stenosis. There was also very distal disease affecting the LAD and ramus. He underwent successful angioplasty and drug-eluting stent placement to the LAD. Ejection fraction was 35-40% which improved subsequently an  echo to 55-60%.   Chronic obstructive pulmonary disease, unspecified (HCC)    COPD (chronic obstructive pulmonary disease) ([place]) 03/2016   4th stage-Dr. Luan Pulling   Essential (primary) hypertension    Hyperlipidemia    Ischemic cardiomyopathy    a. EF by cath 09/13/2015: 35-40%; b. 09/14/2015: EF 55-60%, Probable akinesis of  the midanteroseptal myocardium. akinesis of the apicalanterior myocardium, GR1DD   MI (myocardial infarction) ([place])    Psoriasis    Psoriasis, unspecified    ST elevation (STEMI) myocardial infarction involving left anterior descending  coronary artery ([place])  09/2015 09/13/2015   Tobacco use    Vitreous floaters of both eyes      Past Surgical History:  Procedure Laterality Date   CARDIAC CATHETERIZATION N/A 09/13/2015   Procedure: Left Heart Cath and Coronary Angiography;  Surgeon: Wellington Hampshire, MD;  Location: [place] [facility];  Service: Cardiovascular;  Laterality: N/A;   CARDIAC CATHETERIZATION N/A 09/13/2015   Procedure: Coronary Stent Intervention;  Surgeon: Wellington Hampshire, MD;  Location: [place] [facility];  Service: Cardiovascular;  Laterality: N/A;   CARDIAC CATHETERIZATION N/A 11/03/2015   Procedure: Left Heart Cath and Coronary Angiography;  Surgeon: Peter M Martinique, MD;  Location: [facility] [facility];  Service: Cardiovascular;  Laterality: N/A;   COLONOSCOPY N/A 01/29/2016   Procedure: COLONOSCOPY;  Surgeon: Danie Binder, MD;  [facility];  Service: Endoscopy;  Laterality: N/A;  1030-moved to 1215 office to notify    COLONOSCOPY N/A 02/28/2020   Procedure: COLONOSCOPY;  Surgeon: Daneil Dolin, MD;  [facility];  Service: Endoscopy;  Laterality: N/A;  9:30   ESOPHAGOGASTRODUODENOSCOPY N/A 01/29/2016   Procedure: ESOPHAGOGASTRODUODENOSCOPY (EGD);  Surgeon: Danie Binder, MD;  [facility];  Service: Endoscopy;  Laterality: N/A;   POLYPECTOMY  02/28/2020   Procedure: POLYPECTOMY;  Surgeon: Daneil Dolin, MD;  [facility];  Service: Endoscopy;;    Family History  Problem Relation Age of Onset   Heart Problems Mother    Heart attack Father    Heart disease Father    Cancer Other    Colon cancer Maternal Aunt     Social History   Tobacco Use   Smoking status: Former    Years: 40.00    Types: Cigarettes    Quit date: 09/14/2015    Years since quitting: 6.0   Smokeless tobacco: Never  Vaping Use   Vaping Use: Former  Substance Use Topics   Alcohol use: No    Alcohol/week: 0.0 standard drinks    Comment: Previously heavy beer drinker; quit 20  years ago   Drug use: No    ROS   Objective:   Vitals: BP 113/71 (BP Location: Right Arm)    Pulse 70    Temp 98.7 F (37.1 C) (Oral)    Resp 19    SpO2 97%   Physical Exam Constitutional:      General: He is not in acute distress.    Appearance: Normal appearance. He is well-developed and normal weight. He is not ill-appearing, toxic-appearing or diaphoretic.  HENT:     Head: Normocephalic and atraumatic.     Right Ear: External ear normal.     Left Ear: External ear normal.     Nose: Congestion present. No rhinorrhea.     Mouth/Throat:     Mouth: Mucous membranes are moist.     Pharynx: No oropharyngeal exudate or posterior oropharyngeal erythema.  Eyes:  General: No scleral icterus.       Right eye: No discharge.        Left eye: No discharge.     Extraocular Movements: Extraocular movements intact.     Conjunctiva/sclera: Conjunctivae normal.  Cardiovascular:     Rate and Rhythm: Normal rate and regular rhythm.     Heart sounds: Normal heart sounds. No murmur heard.   No friction rub. No gallop.  Pulmonary:     Effort: Pulmonary effort is normal. No respiratory distress.     Breath sounds: Normal breath sounds. No stridor. No wheezing, rhonchi or rales.  Musculoskeletal:     Cervical back: Normal range of motion.  Neurological:     Mental Status: He is alert and oriented to person, place, and time.  Psychiatric:        Mood and Affect: Mood normal.        Behavior: Behavior normal.        Thought Content: Thought content normal.        Judgment: Judgment normal.    Assessment and Plan :   PDMP not reviewed this encounter.  1. Viral upper respiratory illness   2. Encounter for screening for COVID-19   3. Subacute cough   4. Sinus congestion   5. Type 2 diabetes mellitus treated without insulin ([place])    Deferred imaging given clear cardiopulmonary exam, hemodynamically stable vital signs.  In light of his COPD, recommended that he continue his regular  COPD medications but also offered him a steroid which he was agreeable to.  Use supportive care otherwise. COVID and flu test pending.  We will otherwise manage for viral upper respiratory infection.  Physical exam findings reassuring and vital signs stable for discharge. Advised supportive care, offered symptomatic relief. Counseled patient on potential for adverse effects with medications prescribed/recommended today, ER and return-to-clinic precautions discussed, patient verbalized understanding.      Jaynee Eagles, PA-C 10/04/21 1155

## 2021-10-06 LAB — COVID-19, FLU A+B NAA
Influenza A, NAA: NOT DETECTED
Influenza B, NAA: NOT DETECTED
SARS-CoV-2, NAA: NOT DETECTED

## 2021-10-22 ENCOUNTER — Ambulatory Visit: Payer: Medicare HMO | Admitting: Cardiovascular Disease

## 2021-12-23 LAB — HEMOGLOBIN A1C: Hemoglobin A1C: 6.1

## 2021-12-23 LAB — TSH: TSH: 2.6 (ref 0.41–5.90)

## 2021-12-23 LAB — MICROALBUMIN, URINE: Microalb, Ur: 30

## 2021-12-23 LAB — BASIC METABOLIC PANEL
BUN: 12 (ref 4–21)
Creatinine: 1 (ref 0.6–1.3)

## 2021-12-23 LAB — HEPATIC FUNCTION PANEL
ALT: 159 U/L — AB (ref 10–40)
AST: 45 — AB (ref 14–40)

## 2021-12-23 LAB — LIPID PANEL
LDL Cholesterol: 40
Triglycerides: 48 (ref 40–160)

## 2022-01-04 ENCOUNTER — Ambulatory Visit (INDEPENDENT_AMBULATORY_CARE_PROVIDER_SITE_OTHER): Payer: Medicare HMO | Admitting: Internal Medicine

## 2022-01-04 ENCOUNTER — Encounter: Payer: Self-pay | Admitting: *Deleted

## 2022-01-04 ENCOUNTER — Encounter: Payer: Self-pay | Admitting: Internal Medicine

## 2022-01-04 VITALS — BP 124/82 | HR 75 | Resp 18 | Ht 73.0 in | Wt 180.2 lb

## 2022-01-04 DIAGNOSIS — Z6825 Body mass index (BMI) 25.0-25.9, adult: Secondary | ICD-10-CM | POA: Insufficient documentation

## 2022-01-04 DIAGNOSIS — M25542 Pain in joints of left hand: Secondary | ICD-10-CM | POA: Insufficient documentation

## 2022-01-04 DIAGNOSIS — Z7689 Persons encountering health services in other specified circumstances: Secondary | ICD-10-CM | POA: Insufficient documentation

## 2022-01-04 DIAGNOSIS — F330 Major depressive disorder, recurrent, mild: Secondary | ICD-10-CM | POA: Insufficient documentation

## 2022-01-04 DIAGNOSIS — E119 Type 2 diabetes mellitus without complications: Secondary | ICD-10-CM | POA: Diagnosis not present

## 2022-01-04 DIAGNOSIS — J449 Chronic obstructive pulmonary disease, unspecified: Secondary | ICD-10-CM | POA: Diagnosis not present

## 2022-01-04 DIAGNOSIS — I10 Essential (primary) hypertension: Secondary | ICD-10-CM

## 2022-01-04 DIAGNOSIS — I25119 Atherosclerotic heart disease of native coronary artery with unspecified angina pectoris: Secondary | ICD-10-CM | POA: Diagnosis not present

## 2022-01-04 DIAGNOSIS — R7401 Elevation of levels of liver transaminase levels: Secondary | ICD-10-CM | POA: Insufficient documentation

## 2022-01-04 DIAGNOSIS — Z0271 Encounter for disability determination: Secondary | ICD-10-CM | POA: Insufficient documentation

## 2022-01-04 DIAGNOSIS — D849 Immunodeficiency, unspecified: Secondary | ICD-10-CM | POA: Insufficient documentation

## 2022-01-04 DIAGNOSIS — H527 Unspecified disorder of refraction: Secondary | ICD-10-CM | POA: Insufficient documentation

## 2022-01-04 NOTE — Assessment & Plan Note (Signed)
Lab Results  ?Component Value Date  ? HGBA1C 6.1 12/23/2021  ? ?On Metformin, follows up with Endocrinology ?Advised to follow diabetic diet ?On statin and ARB ?F/u CMP and lipid panel ?Diabetic eye exam: Advised to follow up with Ophthalmology for diabetic eye exam ?

## 2022-01-04 NOTE — Assessment & Plan Note (Signed)
Well-controlled currently with Anoro Ellipta and PRN Albuterol ?Quit smoking in 2016 ?Referred to pulmonology as per patient request ?

## 2022-01-04 NOTE — Assessment & Plan Note (Addendum)
S/p stent placement for STEMI in 2016 ?On Aspirin, Coreg and statin ?Followed by Cardiology ?

## 2022-01-04 NOTE — Patient Instructions (Addendum)
Please continue taking medications as prescribed. Please check if you take Losartan once or twice daily. ?  ?Please continue to follow DASH diet and ambulate as tolerated. ?  ?Please continue to follow up with Cardiologist and Endocrinologist as scheduled. ?  ?Please ask your [facility] Physician about Ultrasound of abdomen for aortic aneurysm screening. ?

## 2022-01-04 NOTE — Progress Notes (Signed)
? ?Established Patient Office Visit ? ?Subjective:  ?Patient ID: Grant Cardenas, male    DOB: March 22, 1956  Age: 66 y.o. MRN: [id number] ? ?CC:  ?Chief Complaint  ?Patient presents with  ? Follow-up  ?  Follow up sees the [place] just wants to keep pcp   ? ? ?HPI ?Grant Cardenas is a 66 y.o. male with past medical history of CAD s/p stent placement in 2016, HTN, COPD, type 2 DM, psoriasis and HLD presents for f/u of his chronic medical conditions. He also sees [place] clinic. ? ?He follows up with Cardiologist in [place] for his h/o CAD. Denies any chest pain or dyspnea currently. ?  ?BP is well-controlled. Takes medications regularly. Patient denies headache, dizziness, chest pain, dyspnea or palpitations. ? ?He still takes metformin 500 mg BID for type II DM.  His last HbA1c was 6.1.  Denies any fatigue, polyuria or polydipsia currently. ?  ?He uses Anoro inhaler regularly and COPD is well-controlled. Quit smoking in 2016.  He has had episodes of COPD exacerbation recently and requests local pulmonology referral. ?  ?He is on Stelara for Psoriasis. He follows up with Dermatology. ? ?He has brought blood test reports from [place] clinic, which have been reviewed and discussed with the patient in detail. ? ? ? ? ?Past Medical History:  ?Diagnosis Date  ? CAD (coronary artery disease)   ? a. anterior ST elevation MI in 09/2015. Cardiac cath showed occluded prox LAD and 60% ostial RCA stenosis. There was also very distal disease affecting the LAD and ramus. He underwent successful angioplasty and drug-eluting stent placement to the LAD. Ejection fraction was 35-40% which improved subsequently an echo to 55-60%.  ? Chronic obstructive pulmonary disease, unspecified ([place])   ? COPD (chronic obstructive pulmonary disease) ([place]) 03/2016  ? 4th stage-Dr. Luan Pulling  ? Essential (primary) hypertension   ? Hyperlipidemia   ? Ischemic cardiomyopathy   ? a. EF by cath 09/13/2015: 35-40%; b. 09/14/2015: EF 55-60%, Probable akinesis of   the midanteroseptal myocardium. akinesis of the apicalanterior myocardium, GR1DD  ? MI (myocardial infarction) ([place])   ? Psoriasis   ? Psoriasis, unspecified   ? ST elevation (STEMI) myocardial infarction involving left anterior descending coronary artery [facility])  09/2015 09/13/2015  ? Tobacco use   ? Vitreous floaters of both eyes   ? ? ?Past Surgical History:  ?Procedure Laterality Date  ? CARDIAC CATHETERIZATION N/A 09/13/2015  ? Procedure: Left Heart Cath and Coronary Angiography;  Surgeon: Wellington Hampshire, MD;  Location: [place] [facility];  Service: Cardiovascular;  Laterality: N/A;  ? CARDIAC CATHETERIZATION N/A 09/13/2015  ? Procedure: Coronary Stent Intervention;  Surgeon: Wellington Hampshire, MD;  Location: [place] [facility];  Service: Cardiovascular;  Laterality: N/A;  ? CARDIAC CATHETERIZATION N/A 11/03/2015  ? Procedure: Left Heart Cath and Coronary Angiography;  Surgeon: Peter M Martinique, MD;  Location: [place] [facility];  Service: Cardiovascular;  Laterality: N/A;  ? COLONOSCOPY N/A 01/29/2016  ? Procedure: COLONOSCOPY;  Surgeon: Danie Binder, MD;  [facility];  Service: Endoscopy;  Laterality: N/A;  1030-moved to 1215 office to notify ?  ? COLONOSCOPY N/A 02/28/2020  ? Procedure: COLONOSCOPY;  Surgeon: Daneil Dolin, MD;  [facility];  Service: Endoscopy;  Laterality: N/A;  9:30  ? ESOPHAGOGASTRODUODENOSCOPY N/A 01/29/2016  ? Procedure: ESOPHAGOGASTRODUODENOSCOPY (EGD);  Surgeon: Danie Binder, MD;  [facility];  Service: Endoscopy;  Laterality: N/A;  ? POLYPECTOMY  02/28/2020  ?  Procedure: POLYPECTOMY;  Surgeon: Daneil Dolin, MD;  [facility];  Service: Endoscopy;;  ? ? ?Family History  ?Problem Relation Age of Onset  ? Heart Problems Mother   ? Heart attack Father   ? Heart disease Father   ? Cancer Other   ? Colon cancer Maternal Aunt   ? ? ?Social History  ? ?Socioeconomic History  ? Marital status: Married  ?  Spouse name: Not on file  ? Number of  children: Not on file  ? Years of education: Not on file  ? Highest education level: Not on file  ?Occupational History  ? Not on file  ?Tobacco Use  ? Smoking status: Former  ?  Years: 40.00  ?  Types: Cigarettes  ?  Quit date: 09/14/2015  ?  Years since quitting: 6.3  ? Smokeless tobacco: Never  ?Vaping Use  ? Vaping Use: Former  ?Substance and Sexual Activity  ? Alcohol use: No  ?  Alcohol/week: 0.0 standard drinks  ?  Comment: Previously heavy beer drinker; quit 20 years ago  ? Drug use: No  ? Sexual activity: Never  ?Other Topics Concern  ? Not on file  ?Social History Narrative  ? Not on file  ? ?Social Determinants of Health  ? ?Financial Resource Strain: Low Risk   ? Difficulty of Paying Living Expenses: Not very hard  ?Food Insecurity: No Food Insecurity  ? Worried About Charity fundraiser in the Last Year: Never true  ? Ran Out of Food in the Last Year: Never true  ?Transportation Needs: No Transportation Needs  ? Lack of Transportation (Medical): No  ? Lack of Transportation (Non-Medical): No  ?Physical Activity: Sufficiently Active  ? Days of Exercise per Week: 5 days  ? Minutes of Exercise per Session: 60 min  ?Stress: No Stress Concern Present  ? Feeling of Stress : Not at all  ?Social Connections: Moderately Isolated  ? Frequency of Communication with Friends and Family: Never  ? Frequency of Social Gatherings with Friends and Family: Never  ? Attends Religious Services: More than 4 times per year  ? Active Member of Clubs or Organizations: No  ? Attends Archivist Meetings: Never  ? Marital Status: Married  ?Intimate Partner Violence: Not At Risk  ? Fear of Current or Ex-Partner: No  ? Emotionally Abused: No  ? Physically Abused: No  ? Sexually Abused: No  ? ? ?Outpatient Medications Prior to Visit  ?Medication Sig Dispense Refill  ? Albuterol Sulfate 108 (90 Base) MCG/ACT AEPB Inhale 1-2 puffs into the lungs 4 (four) times daily as needed (wheezing/shortness of breath.).     ? aspirin EC  81 MG tablet Take 1 tablet (81 mg total) by mouth daily. 90 tablet 3  ? atorvastatin (LIPITOR) 40 MG tablet TAKE ONE TABLET BY MOUTH AT BEDTIME FOR CHOLESTEROL    ? calcium carbonate (OS-CAL) 1250 (500 Ca) MG chewable tablet CHEW 1-2 TABLETS BY MOUTH AT BEDTIME AS NEEDED FOR INDIGESTION    ? Carboxymethylcellulose Sod PF 0.25 % SOLN Place 1 drop into both eyes 4 (four) times daily as needed (dry/irritated eyes.).     ? carvedilol (COREG) 6.25 MG tablet TAKE ONE-HALF TABLET BY MOUTH TWICE A DAY *NOTE CHANGE IN STRENGTH AND DIRECTIONS*    ? Cholecalciferol 25 MCG (1000 UT) tablet Take 1 tablet by mouth daily.    ? clobetasol ointment (TEMOVATE) 1.61 % Apply 1 application topically 2 (two) times daily as needed (psoriasis).  1  ? ezetimibe (ZETIA) 10 MG tablet Take 1 tablet by mouth daily.    ? fluticasone (CUTIVATE) 0.05 % cream Apply 1 application topically 2 (two) times daily as needed (psoriasis (face & groin areas)).     ? ipratropium (ATROVENT) 0.03 % nasal spray Place 2 sprays into both nostrils 2 (two) times daily. 30 mL 0  ? ketoconazole (NIZORAL) 2 % shampoo Apply 1 application topically 2 (two) times a week. Applied to facial hair (beard) & scalp. Leave on 10 minutes prior to washing out.    ? losartan (COZAAR) 50 MG tablet Take 50 mg by mouth 2 (two) times daily.    ? Menthol-Methyl Salicylate (THERA-GESIC) 0.5-15 % CREA APPLY MODERATE AMOUNT TO AFFECTED AREA TWICE A DAY    ? metFORMIN (GLUCOPHAGE) 500 MG tablet TAKE ONE TABLET BY MOUTH TWICE A DAY FOR DIABETES (ANNUAL KIDNEY FUNCTION TESTING IS NEEDED)    ? nitroGLYCERIN (NITROSTAT) 0.4 MG SL tablet Place 1 tablet (0.4 mg total) under the tongue every 5 (five) minutes as needed for chest pain. 25 tablet 12  ? promethazine-dextromethorphan (PROMETHAZINE-DM) 6.25-15 MG/5ML syrup Take 5 mLs by mouth at bedtime as needed for cough. 100 mL 0  ? umeclidinium-vilanterol (ANORO ELLIPTA) 62.5-25 MCG/INH AEPB Inhale 1 puff into the lungs daily.    ? Ustekinumab  (STELARA [place]) Inject 1 Dose into the skin every 3 (three) months. Every 12 weeks    ? cetirizine (ZYRTEC ALLERGY) 10 MG tablet Take 1 tablet (10 mg total) by mouth daily. 30 tablet 0  ? benzonatate (TESSALON

## 2022-01-04 NOTE — Assessment & Plan Note (Signed)
BP Readings from Last 1 Encounters:  ?01/04/22 124/82  ? ?Well-controlled with Coreg and Losartan ?Counseled for compliance with the medications ?Advised DASH diet and moderate exercise/walking, at least 150 mins/week ?

## 2022-01-06 ENCOUNTER — Telehealth: Payer: Self-pay

## 2022-01-06 NOTE — Telephone Encounter (Signed)
Per Grant Cardenas Patients wife called and said they needed to send this referral for [facility] approval.  Grant Cardenas is waiting for approval to schedule.  ?

## 2022-01-11 NOTE — Telephone Encounter (Signed)
Referral faxed to [facility]  ?

## 2022-02-14 ENCOUNTER — Encounter: Payer: Self-pay | Admitting: Nurse Practitioner

## 2022-02-14 ENCOUNTER — Ambulatory Visit (INDEPENDENT_AMBULATORY_CARE_PROVIDER_SITE_OTHER): Payer: Medicare HMO | Admitting: Nurse Practitioner

## 2022-02-14 VITALS — BP 119/75 | HR 60 | Ht 73.0 in | Wt 177.0 lb

## 2022-02-14 DIAGNOSIS — E1165 Type 2 diabetes mellitus with hyperglycemia: Secondary | ICD-10-CM

## 2022-02-14 NOTE — Patient Instructions (Signed)
Diabetes Mellitus and Foot Care Foot care is an important part of your health, especially when you have diabetes. Diabetes may cause you to have problems because of poor blood flow (circulation) to your feet and legs, which can cause your skin to: Become thinner and drier. Break more easily. Heal more slowly. Peel and crack. You may also have nerve damage (neuropathy) in your legs and feet, causing decreased feeling in them. This means that you may not notice minor injuries to your feet that could lead to more serious problems. Noticing and addressing any potential problems early is the best way to prevent future foot problems. How to care for your feet Foot hygiene  Wash your feet daily with warm water and mild soap. Do not use hot water. Then, pat your feet and the areas between your toes until they are completely dry. Do not soak your feet as this can dry your skin. Trim your toenails straight across. Do not dig under them or around the cuticle. File the edges of your nails with an emery board or nail file. Apply a moisturizing lotion or petroleum jelly to the skin on your feet and to dry, brittle toenails. Use lotion that does not contain alcohol and is unscented. Do not apply lotion between your toes. Shoes and socks Wear clean socks or stockings every day. Make sure they are not too tight. Do not wear knee-high stockings since they may decrease blood flow to your legs. Wear shoes that fit properly and have enough cushioning. Always look in your shoes before you put them on to be sure there are no objects inside. To break in new shoes, wear them for just a few hours a day. This prevents injuries on your feet. Wounds, scrapes, corns, and calluses  Check your feet daily for blisters, cuts, bruises, sores, and redness. If you cannot see the bottom of your feet, use a mirror or ask someone for help. Do not cut corns or calluses or try to remove them with medicine. If you find a minor scrape,  cut, or break in the skin on your feet, keep it and the skin around it clean and dry. You may clean these areas with mild soap and water. Do not clean the area with peroxide, alcohol, or iodine. If you have a wound, scrape, corn, or callus on your foot, look at it several times a day to make sure it is healing and not infected. Check for: Redness, swelling, or pain. Fluid or blood. Warmth. Pus or a bad smell. General tips Do not cross your legs. This may decrease blood flow to your feet. Do not use heating pads or hot water bottles on your feet. They may burn your skin. If you have lost feeling in your feet or legs, you may not know this is happening until it is too late. Protect your feet from hot and cold by wearing shoes, such as at the beach or on hot pavement. Schedule a complete foot exam at least once a year (annually) or more often if you have foot problems. Report any cuts, sores, or bruises to your health care provider immediately. Where to find more information American Diabetes Association: www.diabetes.org Association of Diabetes Care & Education Specialists: www.diabeteseducator.org Contact a health care provider if: You have a medical condition that increases your risk of infection and you have any cuts, sores, or bruises on your feet. You have an injury that is not healing. You have redness on your legs or feet. You   feel burning or tingling in your legs or feet. You have pain or cramps in your legs and feet. Your legs or feet are numb. Your feet always feel cold. You have pain around any toenails. Get help right away if: You have a wound, scrape, corn, or callus on your foot and: You have pain, swelling, or redness that gets worse. You have fluid or blood coming from the wound, scrape, corn, or callus. Your wound, scrape, corn, or callus feels warm to the touch. You have pus or a bad smell coming from the wound, scrape, corn, or callus. You have a fever. You have a red  line going up your leg. Summary Check your feet every day for blisters, cuts, bruises, sores, and redness. Apply a moisturizing lotion or petroleum jelly to the skin on your feet and to dry, brittle toenails. Wear shoes that fit properly and have enough cushioning. If you have foot problems, report any cuts, sores, or bruises to your health care provider immediately. Schedule a complete foot exam at least once a year (annually) or more often if you have foot problems. This information is not intended to replace advice given to you by your health care provider. Make sure you discuss any questions you have with your health care provider. Document Revised: 04/09/2020 Document Reviewed: 04/09/2020 Elsevier Patient Education  2023 [employer]  

## 2022-02-14 NOTE — Progress Notes (Signed)
? ?                                                    Endocrinology Follow Up Note  ?     02/14/2022, 10:25 AM ? ? ?Subjective:  ? ? Patient ID: Grant Cardenas, male    DOB: 14-Apr-1956.  ?Grant Cardenas is being seen in follow up after being seen in consultation for management of currently uncontrolled symptomatic diabetes requested by  Lindell Spar, MD. ? ? ?Past Medical History:  ?Diagnosis Date  ? CAD (coronary artery disease)   ? a. anterior ST elevation MI in 09/2015. Cardiac cath showed occluded prox LAD and 60% ostial RCA stenosis. There was also very distal disease affecting the LAD and ramus. He underwent successful angioplasty and drug-eluting stent placement to the LAD. Ejection fraction was 35-40% which improved subsequently an echo to 55-60%.  ? Chronic obstructive pulmonary disease, unspecified ([place])   ? COPD (chronic obstructive pulmonary disease) ([place]) 03/2016  ? 4th stage-Dr. Luan Pulling  ? Essential (primary) hypertension   ? Hyperlipidemia   ? Ischemic cardiomyopathy   ? a. EF by cath 09/13/2015: 35-40%; b. 09/14/2015: EF 55-60%, Probable akinesis of  the midanteroseptal myocardium. akinesis of the apicalanterior myocardium, GR1DD  ? MI (myocardial infarction) ([place])   ? Psoriasis   ? Psoriasis, unspecified   ? ST elevation (STEMI) myocardial infarction involving left anterior descending coronary artery [facility])  09/2015 09/13/2015  ? Tobacco use   ? Vitreous floaters of both eyes   ? ? ?Past Surgical History:  ?Procedure Laterality Date  ? CARDIAC CATHETERIZATION N/A 09/13/2015  ? Procedure: Left Heart Cath and Coronary Angiography;  Surgeon: Wellington Hampshire, MD;  Location: [place] [facility];  Service: Cardiovascular;  Laterality: N/A;  ? CARDIAC CATHETERIZATION N/A 09/13/2015  ? Procedure: Coronary Stent Intervention;  Surgeon: Wellington Hampshire, MD;  Location: [place] [facility];  Service: Cardiovascular;  Laterality: N/A;  ? CARDIAC CATHETERIZATION N/A 11/03/2015  ?  Procedure: Left Heart Cath and Coronary Angiography;  Surgeon: Peter M Martinique, MD;  Location: [place] [facility];  Service: Cardiovascular;  Laterality: N/A;  ? COLONOSCOPY N/A 01/29/2016  ? Procedure: COLONOSCOPY;  Surgeon: Danie Binder, MD;  [facility];  Service: Endoscopy;  Laterality: N/A;  1030-moved to 1215 office to notify ?  ? COLONOSCOPY N/A 02/28/2020  ? Procedure: COLONOSCOPY;  Surgeon: Daneil Dolin, MD;  [facility];  Service: Endoscopy;  Laterality: N/A;  9:30  ? ESOPHAGOGASTRODUODENOSCOPY N/A 01/29/2016  ? Procedure: ESOPHAGOGASTRODUODENOSCOPY (EGD);  Surgeon: Danie Binder, MD;  [facility];  Service: Endoscopy;  Laterality: N/A;  ? POLYPECTOMY  02/28/2020  ? Procedure: POLYPECTOMY;  Surgeon: Daneil Dolin, MD;  [facility];  Service: Endoscopy;;  ? ? ?Social History  ? ?Socioeconomic History  ? Marital status: Married  ?  Spouse name: Not on file  ? Number of children: Not on file  ? Years of education: Not on file  ? Highest education level: Not on file  ?Occupational History  ? Not on file  ?Tobacco Use  ? Smoking status: Former  ?  Years: 40.00  ?  Types: Cigarettes  ?  Quit date: 09/14/2015  ?  Years since quitting: 6.4  ? Smokeless tobacco: Never  ?Vaping  Use  ? Vaping Use: Former  ?Substance and Sexual Activity  ? Alcohol use: No  ?  Alcohol/week: 0.0 standard drinks  ?  Comment: Previously heavy beer drinker; quit 20 years ago  ? Drug use: No  ? Sexual activity: Never  ?Other Topics Concern  ? Not on file  ?Social History Narrative  ? Not on file  ? ?Social Determinants of Health  ? ?Financial Resource Strain: Low Risk   ? Difficulty of Paying Living Expenses: Not very hard  ?Food Insecurity: No Food Insecurity  ? Worried About Charity fundraiser in the Last Year: Never true  ? Ran Out of Food in the Last Year: Never true  ?Transportation Needs: No Transportation Needs  ? Lack of Transportation (Medical): No  ? Lack of Transportation  (Non-Medical): No  ?Physical Activity: Sufficiently Active  ? Days of Exercise per Week: 5 days  ? Minutes of Exercise per Session: 60 min  ?Stress: No Stress Concern Present  ? Feeling of Stress : Not at all  ?Social Connections: Moderately Isolated  ? Frequency of Communication with Friends and Family: Never  ? Frequency of Social Gatherings with Friends and Family: Never  ? Attends Religious Services: More than 4 times per year  ? Active Member of Clubs or Organizations: No  ? Attends Archivist Meetings: Never  ? Marital Status: Married  ? ? ?Family History  ?Problem Relation Age of Onset  ? Heart Problems Mother   ? Heart attack Father   ? Heart disease Father   ? Cancer Other   ? Colon cancer Maternal Aunt   ? ? ?Outpatient Encounter Medications as of 02/14/2022  ?Medication Sig  ? Albuterol Sulfate 108 (90 Base) MCG/ACT AEPB Inhale 1-2 puffs into the lungs 4 (four) times daily as needed (wheezing/shortness of breath.).   ? aspirin EC 81 MG tablet Take 1 tablet (81 mg total) by mouth daily.  ? atorvastatin (LIPITOR) 40 MG tablet TAKE ONE TABLET BY MOUTH AT BEDTIME FOR CHOLESTEROL  ? calcium carbonate (OS-CAL) 1250 (500 Ca) MG chewable tablet CHEW 1-2 TABLETS BY MOUTH AT BEDTIME AS NEEDED FOR INDIGESTION  ? Carboxymethylcellulose Sod PF 0.25 % SOLN Place 1 drop into both eyes 4 (four) times daily as needed (dry/irritated eyes.).   ? carvedilol (COREG) 6.25 MG tablet TAKE ONE-HALF TABLET BY MOUTH TWICE A DAY *NOTE CHANGE IN STRENGTH AND DIRECTIONS*  ? Cholecalciferol 25 MCG (1000 UT) tablet Take 1 tablet by mouth daily.  ? clobetasol ointment (TEMOVATE) 9.21 % Apply 1 application topically 2 (two) times daily as needed (psoriasis).   ? ezetimibe (ZETIA) 10 MG tablet Take 1 tablet by mouth daily.  ? fluticasone (CUTIVATE) 0.05 % cream Apply 1 application topically 2 (two) times daily as needed (psoriasis (face & groin areas)).   ? ketoconazole (NIZORAL) 2 % shampoo Apply 1 application topically 2 (two)  times a week. Applied to facial hair (beard) & scalp. Leave on 10 minutes prior to washing out.  ? losartan (COZAAR) 50 MG tablet Take 50 mg by mouth 2 (two) times daily.  ? Menthol-Methyl Salicylate (THERA-GESIC) 0.5-15 % CREA APPLY MODERATE AMOUNT TO AFFECTED AREA TWICE A DAY  ? nitroGLYCERIN (NITROSTAT) 0.4 MG SL tablet Place 1 tablet (0.4 mg total) under the tongue every 5 (five) minutes as needed for chest pain.  ? umeclidinium-vilanterol (ANORO ELLIPTA) 62.5-25 MCG/INH AEPB Inhale 1 puff into the lungs daily.  ? Ustekinumab (STELARA ) Inject 1 Dose into the skin every  3 (three) months. Every 12 weeks  ? [DISCONTINUED] ipratropium (ATROVENT) 0.03 % nasal spray Place 2 sprays into both nostrils 2 (two) times daily.  ? [DISCONTINUED] metFORMIN (GLUCOPHAGE) 500 MG tablet 250 mg 2 (two) times daily with a meal.  ? [DISCONTINUED] promethazine-dextromethorphan (PROMETHAZINE-DM) 6.25-15 MG/5ML syrup Take 5 mLs by mouth at bedtime as needed for cough.  ? ?No facility-administered encounter medications on file as of 02/14/2022.  ? ? ?ALLERGIES: ?Allergies  ?Allergen Reactions  ? Sulfa Antibiotics Other (See Comments)  ?  Sulfa drugs (unaware of this reaction per patient spouse) ?   ? ? ?VACCINATION STATUS: ?Immunization History  ?Administered Date(s) Administered  ? Fluad Quad(high Dose 65+) 07/03/2020  ? Influenza Inj Mdck Quad Pf 08/11/2016  ? Influenza, High Dose Seasonal PF 09/05/2016  ? Influenza, Seasonal, Injecte, Preservative Fre 06/18/2019  ? Influenza,inj,Quad PF,6+ Mos 07/07/2017, 07/02/2018, 06/18/2019  ? Influenza-Unspecified 06/18/2019  ? Moderna Sars-Covid-2 Vaccination 11/07/2019, 12/05/2019, 08/04/2020, 01/12/2021  ? Pneumococcal Conjugate-13 06/07/2016  ? Pneumococcal Polysaccharide-23 07/07/2017  ? Td (Adult) 06/07/2016  ? Tdap 11/03/2004  ? Zoster Recombinat (Shingrix) 10/04/2019, 03/20/2020  ? ? ?Diabetes ?He presents for his follow-up diabetic visit. He has type 2 diabetes mellitus. Onset time:  Diagnosed at approx age of 26. His disease course has been stable. There are no hypoglycemic associated symptoms. Pertinent negatives for diabetes include no fatigue and no polyuria. There are no hypoglycemic compli

## 2022-03-23 ENCOUNTER — Encounter: Payer: Self-pay | Admitting: Cardiovascular Disease

## 2022-03-23 ENCOUNTER — Ambulatory Visit: Payer: Medicare HMO | Admitting: Cardiovascular Disease

## 2022-03-23 VITALS — BP 100/70 | HR 56 | Ht 73.0 in | Wt 178.1 lb

## 2022-03-23 DIAGNOSIS — R0989 Other specified symptoms and signs involving the circulatory and respiratory systems: Secondary | ICD-10-CM

## 2022-03-23 DIAGNOSIS — I255 Ischemic cardiomyopathy: Secondary | ICD-10-CM | POA: Diagnosis not present

## 2022-03-23 DIAGNOSIS — I2510 Atherosclerotic heart disease of native coronary artery without angina pectoris: Secondary | ICD-10-CM | POA: Diagnosis not present

## 2022-03-23 DIAGNOSIS — E785 Hyperlipidemia, unspecified: Secondary | ICD-10-CM

## 2022-03-23 MED ORDER — CARVEDILOL 3.125 MG PO TABS: 3.1250 mg | ORAL_TABLET | Freq: Two times a day (BID) | ORAL | 3 refills | Status: DC

## 2022-03-23 NOTE — Progress Notes (Signed)
Cardiology Office Note   Date:  03/23/2022   ID:  Grant Cardenas, DOB 1956-05-09, MRN [id number]  PCP:  Grant Spar, MD  Cardiologist:   Kathlyn Sacramento, MD   Chief Complaint  Patient presents with   Other    OD 12 month f/u no complaints today. Meds reviewed verbally with pt.      History of Present Illness: [name] is a 66 y.o. male who presents for  a follow-up visit regarding coronary artery disease and ischemic cardiomyopathy. He presented in December 2016 with anterior ST elevation myocardial infarction. Cardiac catheterization showed occluded proximal LAD and 60% ostial RCA stenosis. There was also very distal disease affecting the LAD and ramus. He underwent successful angioplasty and drug-eluting stent placement to the LAD. Ejection fraction was 35-40% which improved subsequently to 50-55%. His other medical problems include psoriasis, Previous tobacco use, COPD and hyperlipidemia.  He is known to have right carotid bruit but previous carotid Doppler showed no obstructive disease.   He had an echocardiogram done at the [place] in May of 2021 which showed an EF of 50 to 55% with mild anterior, anteroseptal and apical hypokinesis.  He has been doing well overall with minimal chest pain and no shortness of breath.  He takes his medications regularly.  He is noted to be bradycardic today.  Past Medical History:  Diagnosis Date   CAD (coronary artery disease)    a. anterior ST elevation MI in 09/2015. Cardiac cath showed occluded prox LAD and 60% ostial RCA stenosis. There was also very distal disease affecting the LAD and ramus. He underwent successful angioplasty and drug-eluting stent placement to the LAD. Ejection fraction was 35-40% which improved subsequently an echo to 55-60%.   Chronic obstructive pulmonary disease, unspecified (H[facility])    COPD (chronic obstructive pulmonary disease) ([place]) 03/2016   4th stage-Dr. Luan Pulling   Essential (primary) hypertension     Hyperlipidemia    Ischemic cardiomyopathy    a. EF by cath 09/13/2015: 35-40%; b. 09/14/2015: EF 55-60%, Probable akinesis of  the midanteroseptal myocardium. akinesis of the apicalanterior myocardium, GR1DD   MI (myocardial infarction) ([place])    Psoriasis    Psoriasis, unspecified    ST elevation (STEMI) myocardial infarction involving left anterior descending coronary artery ([place])  09/2015 09/13/2015   Tobacco use    Vitreous floaters of both eyes     Past Surgical History:  Procedure Laterality Date   CARDIAC CATHETERIZATION N/A 09/13/2015   Procedure: Left Heart Cath and Coronary Angiography;  Surgeon: Wellington Hampshire, MD;  Location: [place] [facility] [facility];  Service: Cardiovascular;  Laterality: N/A;   CARDIAC CATHETERIZATION N/A 09/13/2015   Procedure: Coronary Stent Intervention;  Surgeon: Wellington Hampshire, MD;  Location: [facility];  Service: Cardiovascular;  Laterality: N/A;   CARDIAC CATHETERIZATION N/A 11/03/2015   Procedure: Left Heart Cath and Coronary Angiography;  Surgeon: Peter M Martinique, MD;  Location: [facility];  Service: Cardiovascular;  Laterality: N/A;   COLONOSCOPY N/A 01/29/2016   Procedure: COLONOSCOPY;  Surgeon: Danie Binder, MD;  [facility];  Service: Endoscopy;  Laterality: N/A;  1030-moved to 1215 office to notify    COLONOSCOPY N/A 02/28/2020   Procedure: COLONOSCOPY;  Surgeon: Daneil Dolin, MD;  [facility];  Service: Endoscopy;  Laterality: N/A;  9:30   ESOPHAGOGASTRODUODENOSCOPY N/A 01/29/2016   Procedure: ESOPHAGOGASTRODUODENOSCOPY (EGD);  Surgeon: Danie Binder, MD;  [facility];  Service: Endoscopy;  Laterality: N/A;   POLYPECTOMY  02/28/2020   Procedure: POLYPECTOMY;  Surgeon: Daneil Dolin, MD;  [facility];  Service: Endoscopy;;     Current Outpatient Medications  Medication Sig Dispense Refill   Albuterol Sulfate 108 (90 Base) MCG/ACT AEPB Inhale 1-2 puffs into the lungs 4 (four)  times daily as needed (wheezing/shortness of breath.).      aspirin EC 81 MG tablet Take 1 tablet (81 mg total) by mouth daily. 90 tablet 3   atorvastatin (LIPITOR) 40 MG tablet TAKE ONE TABLET BY MOUTH AT BEDTIME FOR CHOLESTEROL     calcium carbonate (OS-CAL) 1250 (500 Ca) MG chewable tablet CHEW 1-2 TABLETS BY MOUTH AT BEDTIME AS NEEDED FOR INDIGESTION     Carboxymethylcellulose Sod PF 0.25 % SOLN Place 1 drop into both eyes 4 (four) times daily as needed (dry/irritated eyes.).      carvedilol (COREG) 6.25 MG tablet TAKE ONE-HALF TABLET BY MOUTH TWICE A DAY *NOTE CHANGE IN STRENGTH AND DIRECTIONS*     Cholecalciferol 25 MCG (1000 UT) tablet Take 1 tablet by mouth daily.     clobetasol ointment (TEMOVATE) 4.25 % Apply 1 application topically 2 (two) times daily as needed (psoriasis).   1   ezetimibe (ZETIA) 10 MG tablet Take 1 tablet by mouth daily.     fluticasone (CUTIVATE) 0.05 % cream Apply 1 application topically 2 (two) times daily as needed (psoriasis (face & groin areas)).      ketoconazole (NIZORAL) 2 % shampoo Apply 1 application topically 2 (two) times a week. Applied to facial hair (beard) & scalp. Leave on 10 minutes prior to washing out.     losartan (COZAAR) 50 MG tablet Take 50 mg by mouth 2 (two) times daily.     Menthol-Methyl Salicylate (THERA-GESIC) 0.5-15 % CREA APPLY MODERATE AMOUNT TO AFFECTED AREA TWICE A DAY     nitroGLYCERIN (NITROSTAT) 0.4 MG SL tablet Place 1 tablet (0.4 mg total) under the tongue every 5 (five) minutes as needed for chest pain. 25 tablet 12   umeclidinium-vilanterol (ANORO ELLIPTA) 62.5-25 MCG/INH AEPB Inhale 1 puff into the lungs daily.     Ustekinumab (STELARA [place]) Inject 1 Dose into the skin every 3 (three) months. Every 12 weeks     No current facility-administered medications for this visit.    Allergies:   Sulfa antibiotics    Social History:  The patient  reports that he quit smoking about 6 years ago. His smoking use included cigarettes. He  has never used smokeless tobacco. He reports that he does not drink alcohol and does not use drugs.   Family History:  The patient's family history includes Cancer in an other family member; Colon cancer in his maternal aunt; Heart Problems in his mother; Heart attack in his father; Heart disease in his father.      PHYSICAL EXAM: VS:  BP 100/70 (BP Location: Left Arm, Patient Position: Sitting, Cuff Size: Normal)   Pulse (!) 56   Ht '6\' 1"'$  (1.854 m)   Wt 178 lb 2 oz (80.8 kg)   SpO2 98%   BMI 23.50 kg/m  , BMI Body mass index is 23.5 kg/m. GEN: Well nourished, well developed, in no acute distress  HEENT: normal  Neck: no JVD or masses. Right carotid bruit Cardiac: RRR; no murmurs, rubs, or gallops,no edema  Respiratory:  clear to auscultation bilaterally, normal work of breathing GI: soft, nontender, nondistended, + BS MS: no deformity or atrophy  Skin:  warm and dry, no rash Neuro:  Strength and sensation are intact Psych: euthymic mood, full affect   EKG:  EKG  ordered today. EKG showed sinus bradycardia with poor R wave progression in the anterior leads.   Recent Labs: 12/23/2021: ALT 159; BUN 12; Creatinine 1.0; TSH 2.60    Lipid Panel    Component Value Date/Time   CHOL 86 12/03/2020 0000   CHOL 212 (H) 09/23/2015 1540   TRIG 48 12/23/2021 0000   HDL 43 11/08/2018 0000   HDL 41 09/23/2015 1540   CHOLHDL 3.7 11/26/2015 0815   VLDL 17 11/26/2015 0815   LDLCALC 40 12/23/2021 0000   LDLCALC 137 (H) 09/23/2015 1540      Wt Readings from Last 3 Encounters:  03/23/22 178 lb 2 oz (80.8 kg)  02/14/22 177 lb (80.3 kg)  01/04/22 180 lb 3.2 oz (81.7 kg)        ASSESSMENT AND PLAN:  1.  Coronary artery disease involving native coronary arteries of native heart without angina :  He is doing well overall. Continue aspirin indefinitely.  2.  Ischemic cardiomyopathy: Most recent echocardiogram showed an EF of 50 to 55%.  He does not require diuretics and he appears  to be euvolemic.  Given relatively low blood pressure and heart rate, I elected to decrease carvedilol to 3.125 mg twice daily.  Continue losartan.  3. Hyperlipidemia : I reviewed most recent labs done in March which showed an LDL of 40.  Continue atorvastatin and Zetia.  He continues to have abnormal liver enzymes.  He does follow with GI.  It is reported that his LFTs increased after starting treatment for psoriasis.  4. Right carotid bruit: Carotid Doppler in 2018 showed mild nonobstructive disease.     Disposition:   FU with me in 12 months  Signed,  Kathlyn Sacramento, MD  03/23/2022 8:31 AM    [place]

## 2022-03-23 NOTE — Patient Instructions (Signed)
Medication Instructions:  Your physician has recommended you make the following change in your medication:   DECREASE Carvedilol to 3.125 mg twice a day. An Rx has been sent to your pharmacy.  *If you need a refill on your cardiac medications before your next appointment, please call your pharmacy*   Lab Work: None ordered  If you have labs (blood work) drawn today and your tests are completely normal, you will receive your results only by: [place] (if you have MyChart) OR A paper copy in the mail If you have any lab test that is abnormal or we need to change your treatment, we will call you to review the results.   Testing/Procedures: None ordered   Follow-Up: At [facility], you and your health needs are our priority.  As part of our continuing mission to provide you with exceptional heart care, we have created designated Provider Care Teams.  These Care Teams include your primary Cardiologist (physician) and Advanced Practice Providers (APPs -  Physician Assistants and Nurse Practitioners) who all work together to provide you with the care you need, when you need it.  We recommend signing up for the patient portal called "MyChart".  Sign up information is provided on this After Visit Summary.  MyChart is used to connect with patients for Virtual Visits (Telemedicine).  Patients are able to view lab/test results, encounter notes, upcoming appointments, etc.  Non-urgent messages can be sent to your provider as well.   To learn more about what you can do with MyChart, go to [email or web address].    Your next appointment:   Your physician wants you to follow-up in: 1 year You will receive a reminder letter in the mail two months in advance. If you don't receive a letter, please call our office to schedule the follow-up appointment.   The format for your next appointment:   In Person  Provider:   You may see Kathlyn Sacramento, MD or one of the following Advanced Practice  Providers on your designated Care Team:   Murray Hodgkins, NP Christell Faith, PA-C Cadence Kathlen Mody, [place]   Other Instructions N/A  Important Information About Sugar

## 2022-06-20 ENCOUNTER — Ambulatory Visit: Payer: No Typology Code available for payment source | Admitting: Nurse Practitioner

## 2022-06-24 ENCOUNTER — Other Ambulatory Visit: Payer: Self-pay | Admitting: Physician Assistant

## 2022-06-24 ENCOUNTER — Other Ambulatory Visit (HOSPITAL_COMMUNITY): Payer: Self-pay | Admitting: Physician Assistant

## 2022-06-24 DIAGNOSIS — Z136 Encounter for screening for cardiovascular disorders: Secondary | ICD-10-CM

## 2022-06-24 DIAGNOSIS — Z87891 Personal history of nicotine dependence: Secondary | ICD-10-CM

## 2022-06-28 ENCOUNTER — Telehealth: Payer: Self-pay | Admitting: Nurse Practitioner

## 2022-06-28 ENCOUNTER — Ambulatory Visit (INDEPENDENT_AMBULATORY_CARE_PROVIDER_SITE_OTHER): Payer: No Typology Code available for payment source | Admitting: Nurse Practitioner

## 2022-06-28 ENCOUNTER — Encounter: Payer: Self-pay | Admitting: Nurse Practitioner

## 2022-06-28 VITALS — BP 112/76 | HR 92 | Ht 73.0 in | Wt 167.4 lb

## 2022-06-28 DIAGNOSIS — E119 Type 2 diabetes mellitus without complications: Secondary | ICD-10-CM | POA: Diagnosis not present

## 2022-06-28 LAB — POCT GLYCOSYLATED HEMOGLOBIN (HGB A1C): Hemoglobin A1C: 11.1 % — AB (ref 4.0–5.6)

## 2022-06-28 MED ORDER — GLIPIZIDE ER 2.5 MG PO TB24: 2.5000 mg | ORAL_TABLET | Freq: Every day | ORAL | 0 refills | Status: DC

## 2022-06-28 NOTE — Telephone Encounter (Signed)
Sent for new [facility] Authorization on 06/28/22- currently waiting the response. Next appt 08/29/22

## 2022-06-28 NOTE — Progress Notes (Signed)
Endocrinology Follow Up Note       06/28/2022, 12:51 PM   Subjective:    Patient ID: Grant Cardenas, male    DOB: Feb 28, 1956.  Grant Cardenas is being seen in follow up after being seen in consultation for management of currently uncontrolled symptomatic diabetes requested by  Lindell Spar, MD.   Past Medical History:  Diagnosis Date   CAD (coronary artery disease)    a. anterior ST elevation MI in 09/2015. Cardiac cath showed occluded prox LAD and 60% ostial RCA stenosis. There was also very distal disease affecting the LAD and ramus. He underwent successful angioplasty and drug-eluting stent placement to the LAD. Ejection fraction was 35-40% which improved subsequently an echo to 55-60%.   Chronic obstructive pulmonary disease, unspecified (HCC)    COPD (chronic obstructive pulmonary disease) ([place]) 03/2016   4th stage-Dr. Luan Pulling   Essential (primary) hypertension    Hyperlipidemia    Ischemic cardiomyopathy    a. EF by cath 09/13/2015: 35-40%; b. 09/14/2015: EF 55-60%, Probable akinesis of  the midanteroseptal myocardium. akinesis of the apicalanterior myocardium, GR1DD   MI (myocardial infarction) ([place])    Psoriasis    Psoriasis, unspecified    ST elevation (STEMI) myocardial infarction involving left anterior descending coronary artery ([place])  09/2015 09/13/2015   Tobacco use    Vitreous floaters of both eyes     Past Surgical History:  Procedure Laterality Date   CARDIAC CATHETERIZATION N/A 09/13/2015   Procedure: Left Heart Cath and Coronary Angiography;  Surgeon: Wellington Hampshire, MD;  Location: [facility];  Service: Cardiovascular;  Laterality: N/A;   CARDIAC CATHETERIZATION N/A 09/13/2015   Procedure: Coronary Stent Intervention;  Surgeon: Wellington Hampshire, MD;  Location: [facility];  Service: Cardiovascular;  Laterality: N/A;   CARDIAC CATHETERIZATION N/A 11/03/2015    Procedure: Left Heart Cath and Coronary Angiography;  Surgeon: Peter M Martinique, MD;  Location: [facility];  Service: Cardiovascular;  Laterality: N/A;   COLONOSCOPY N/A 01/29/2016   Procedure: COLONOSCOPY;  Surgeon: Danie Binder, MD;  [facility];  Service: Endoscopy;  Laterality: N/A;  1030-moved to 1215 office to notify    COLONOSCOPY N/A 02/28/2020   Procedure: COLONOSCOPY;  Surgeon: Daneil Dolin, MD;  [facility];  Service: Endoscopy;  Laterality: N/A;  9:30   ESOPHAGOGASTRODUODENOSCOPY N/A 01/29/2016   Procedure: ESOPHAGOGASTRODUODENOSCOPY (EGD);  Surgeon: Danie Binder, MD;  [facility];  Service: Endoscopy;  Laterality: N/A;   POLYPECTOMY  02/28/2020   Procedure: POLYPECTOMY;  Surgeon: Daneil Dolin, MD;  [facility];  Service: Endoscopy;;    Social History   Socioeconomic History   Marital status: Married    Spouse name: Not on file   Number of children: Not on file   Years of education: Not on file   Highest education level: Not on file  Occupational History   Not on file  Tobacco Use   Smoking status: Former    Years: 40.00    Types: Cigarettes    Quit date: 09/14/2015    Years since quitting: 6.7   Smokeless tobacco: Never  Vaping  Use   Vaping Use: Former  Substance and Sexual Activity   Alcohol use: No    Alcohol/week: 0.0 standard drinks of alcohol    Comment: Previously heavy beer drinker; quit 20 years ago   Drug use: No   Sexual activity: Never  Other Topics Concern   Not on file  Social History Narrative   Not on file   Social Determinants of Health   Financial Resource Strain: Low Risk  (05/03/2021)   Overall Financial Resource Strain (CARDIA)    Difficulty of Paying Living Expenses: Not very hard  Food Insecurity: No Food Insecurity (05/03/2021)   Hunger Vital Sign    Worried About Running Out of Food in the Last Year: Never true    [place] in the Last Year: Never true  Transportation  Needs: No Transportation Needs (05/03/2021)   PRAPARE - Hydrologist (Medical): No    Lack of Transportation (Non-Medical): No  Physical Activity: Sufficiently Active (05/03/2021)   Exercise Vital Sign    Days of Exercise per Week: 5 days    Minutes of Exercise per Session: 60 min  Stress: No Stress Concern Present (05/03/2021)   [place]    Feeling of Stress : Not at all  Social Connections: Moderately Isolated (05/03/2021)   Social Connection and Isolation Panel [NHANES]    Frequency of Communication with Friends and Family: Never    Frequency of Social Gatherings with Friends and Family: Never    Attends Religious Services: More than 4 times per year    Active Member of [employer] or Organizations: No    Attends Archivist Meetings: Never    Marital Status: Married    Family History  Problem Relation Age of Onset   Heart Problems Mother    Heart attack Father    Heart disease Father    Cancer Other    Colon cancer Maternal Aunt     Outpatient Encounter Medications as of 06/28/2022  Medication Sig   Albuterol Sulfate 108 (90 Base) MCG/ACT AEPB Inhale 1-2 puffs into the lungs 4 (four) times daily as needed (wheezing/shortness of breath.).    aspirin EC 81 MG tablet Take 1 tablet (81 mg total) by mouth daily.   atorvastatin (LIPITOR) 40 MG tablet TAKE ONE TABLET BY MOUTH AT BEDTIME FOR CHOLESTEROL   calcium carbonate (OS-CAL) 1250 (500 Ca) MG chewable tablet CHEW 1-2 TABLETS BY MOUTH AT BEDTIME AS NEEDED FOR INDIGESTION   Carboxymethylcellulose Sod PF 0.25 % SOLN Place 1 drop into both eyes 4 (four) times daily as needed (dry/irritated eyes.).    carvedilol (COREG) 3.125 MG tablet Take 1 tablet (3.125 mg total) by mouth 2 (two) times daily with a meal.   Cholecalciferol 25 MCG (1000 UT) tablet Take 1 tablet by mouth daily.   clobetasol ointment (TEMOVATE) 5.70 % Apply 1 application  topically 2 (two) times daily as needed (psoriasis).    empagliflozin (JARDIANCE) 25 MG TABS tablet TAKE ONE-HALF TABLET BY MOUTH EVERY MORNING FOR DIABETES AND KIDNEY   ezetimibe (ZETIA) 10 MG tablet Take 1 tablet by mouth daily.   fluticasone (CUTIVATE) 0.05 % cream Apply 1 application topically 2 (two) times daily as needed (psoriasis (face & groin areas)).    glipiZIDE (GLUCOTROL XL) 2.5 MG 24 hr tablet Take 1 tablet (2.5 mg total) by mouth daily with breakfast.   ketoconazole (NIZORAL) 2 % shampoo Apply 1 application topically 2 (two)  times a week. Applied to facial hair (beard) & scalp. Leave on 10 minutes prior to washing out.   losartan (COZAAR) 50 MG tablet Take 50 mg by mouth 2 (two) times daily.   Menthol-Methyl Salicylate (THERA-GESIC) 0.5-15 % CREA APPLY MODERATE AMOUNT TO AFFECTED AREA TWICE A DAY   metFORMIN (GLUCOPHAGE) 500 MG tablet Take 500 mg by mouth 2 (two) times daily with a meal.   nitroGLYCERIN (NITROSTAT) 0.4 MG SL tablet Place 1 tablet (0.4 mg total) under the tongue every 5 (five) minutes as needed for chest pain.   pantoprazole (PROTONIX) 40 MG tablet Take 40 mg by mouth daily.   predniSONE (DELTASONE) 5 MG tablet TAKE THREE AND ONE-HALF TABLETS BY MOUTH DAILY FOR 21 DAYS, THEN TAKE THREE TABLETS DAILY FOR 21 DAYS, THEN TAKE TWO AND ONE-HALF TABLETS DAILY FOR 21 DAYS, THEN TAKE TWO TABLETS DAILY FOR 21 DAYS   umeclidinium-vilanterol (ANORO ELLIPTA) 62.5-25 MCG/INH AEPB Inhale 1 puff into the lungs daily.   Ustekinumab (STELARA [place]) Inject 1 Dose into the skin every 3 (three) months. Every 12 weeks   No facility-administered encounter medications on file as of 06/28/2022.    ALLERGIES: Allergies  Allergen Reactions   Sulfa Antibiotics Other (See Comments)    Sulfa drugs (unaware of this reaction per patient spouse)      VACCINATION STATUS: Immunization History  Administered Date(s) Administered   Fluad Quad(high Dose 65+) 07/03/2020   Influenza Inj Mdck Quad Pf  08/11/2016   Influenza, High Dose Seasonal PF 09/05/2016   Influenza, Seasonal, Injecte, Preservative Fre 06/18/2019   Influenza,inj,Quad PF,6+ Mos 07/07/2017, 07/02/2018, 06/18/2019   Influenza-Unspecified 06/18/2019   Moderna Sars-Covid-2 Vaccination 11/07/2019, 12/05/2019, 08/04/2020, 01/12/2021   Pneumococcal Conjugate-13 06/07/2016   Pneumococcal Polysaccharide-23 07/07/2017   Td (Adult) 06/07/2016   Tdap 11/03/2004   Zoster Recombinat (Shingrix) 10/04/2019, 03/20/2020    Diabetes He presents for his follow-up diabetic visit. He has type 2 diabetes mellitus. Onset time: Diagnosed at approx age of 57. His disease course has been worsening. There are no hypoglycemic associated symptoms. Associated symptoms include blurred vision, polydipsia, polyuria and weight loss. Pertinent negatives for diabetes include no fatigue. There are no hypoglycemic complications. Symptoms are worsening. Diabetic complications include heart disease and nephropathy. Risk factors for coronary artery disease include diabetes mellitus, dyslipidemia, hypertension and male sex. Current diabetic treatment includes diet. He is compliant with treatment most of the time. His weight is decreasing steadily. He is following a generally healthy diet. When asked about meal planning, he reported none. He has not had a previous visit with a dietitian. He participates in exercise intermittently. His home blood glucose trend is increasing rapidly. His overall blood glucose range is >200 mg/dl. (He presents today, accompanied by his wife, with no meter or logs (was not asked to check glucose routinely due to being off medication).  He has been treated with long term oral steroids for polymyalgias from the rheumatologist, is now in the process of tapering off, and has had high glucose as a result.  He notes he did restart his Metformin 500 mg twice daily to combat the high readings.  His POCT A1c today is 11.1%, increasing drastically from  last visit of 6.1%.  His PCP did start him on Jardiance since last visit too as a result of the high glucose.) An ACE inhibitor/angiotensin II receptor blocker is being taken. He does not see a podiatrist.Eye exam is current.  Hyperlipidemia This is a chronic problem. The current episode started more  than 1 year ago. The problem is controlled. Recent lipid tests were reviewed and are normal. Exacerbating diseases include diabetes. Factors aggravating his hyperlipidemia include beta blockers. Current antihyperlipidemic treatment includes statins. The current treatment provides significant improvement of lipids. There are no compliance problems.  Risk factors for coronary artery disease include diabetes mellitus, dyslipidemia, hypertension and male sex.  Hypertension This is a chronic problem. The current episode started more than 1 year ago. The problem has been resolved since onset. The problem is controlled. Associated symptoms include blurred vision. There are no associated agents to hypertension. Risk factors for coronary artery disease include diabetes mellitus, dyslipidemia and male gender. Past treatments include angiotensin blockers and beta blockers. The current treatment provides moderate improvement. There are no compliance problems.  Hypertensive end-organ damage includes CAD/MI.    Review of systems  Constitutional: + rapidly decreasing body weight,  current Body mass index is 22.09 kg/m. , + fatigue, no subjective hyperthermia, no subjective hypothermia Eyes: + blurry vision, no xerophthalmia ENT: no sore throat, no nodules palpated in throat, no dysphagia/odynophagia, no hoarseness Cardiovascular: no chest pain, no shortness of breath, no palpitations, no leg swelling Respiratory: no cough, no shortness of breath Gastrointestinal: no nausea/vomiting/diarrhea Genitourinary: + polyuria Musculoskeletal: no muscle/joint aches Skin: no rashes, no hyperemia Neurological: no tremors, no  numbness, no tingling, no dizziness Psychiatric: no depression, no anxiety  Objective:     BP 112/76 (BP Location: Left Arm, Patient Position: Sitting, Cuff Size: Large)   Pulse 92   Ht '6\' 1"'$  (1.854 m)   Wt 167 lb 6.4 oz (75.9 kg)   BMI 22.09 kg/m   Wt Readings from Last 3 Encounters:  06/28/22 167 lb 6.4 oz (75.9 kg)  03/23/22 178 lb 2 oz (80.8 kg)  02/14/22 177 lb (80.3 kg)     BP Readings from Last 3 Encounters:  06/28/22 112/76  03/23/22 100/70  02/14/22 119/75      Physical Exam- Limited  Constitutional:  Body mass index is 22.09 kg/m. , not in acute distress, normal state of mind Eyes:  EOMI, no exophthalmos Neck: Supple Cardiovascular: RRR, no murmurs, rubs, or gallops, no edema Respiratory: Adequate breathing efforts, no crackles, rales, rhonchi, or wheezing Musculoskeletal: no gross deformities, strength intact in all four extremities, no gross restriction of joint movements Skin:  no rashes, no hyperemia Neurological: no tremor with outstretched hands   Diabetic Foot Exam - Simple   Simple Foot Form Diabetic Foot exam was performed with the following findings: Yes 06/28/2022 11:52 AM  Visual Inspection No deformities, no ulcerations, no other skin breakdown bilaterally: Yes Sensation Testing Intact to touch and monofilament testing bilaterally: Yes Pulse Check Posterior Tibialis and Dorsalis pulse intact bilaterally: Yes Comments     CMP ( most recent) CMP     Component Value Date/Time   NA 130 (L) 11/28/2020 1328   NA 140 11/08/2018 0000   K 4.5 11/28/2020 1328   CL 99 11/28/2020 1328   CO2 19 (L) 11/28/2020 1328   GLUCOSE 448 (H) 11/28/2020 1328   BUN 12 12/23/2021 0000   CREATININE 1.0 12/23/2021 0000   CREATININE 1.14 11/28/2020 1328   CALCIUM 9.4 06/24/2021 0000   PROT 7.7 11/28/2020 1328   PROT 7.2 09/23/2015 1540   ALBUMIN 4.4 11/28/2020 1328   ALBUMIN 4.4 09/23/2015 1540   AST 45 (A) 12/23/2021 0000   ALT 159 (A) 12/23/2021 0000    ALKPHOS 101 11/28/2020 1328   BILITOT 2.7 (H) 11/28/2020 1328  BILITOT 0.9 09/23/2015 1540   GFRNONAA >60 11/28/2020 1328   GFRAA 81 06/24/2021 0000     Diabetic Labs (most recent): Lab Results  Component Value Date   HGBA1C 11.1 (A) 06/28/2022   HGBA1C 6.1 12/23/2021   HGBA1C 6.2 08/16/2021   MICROALBUR 30 12/23/2021   MICROALBUR 10 08/16/2021   MICROALBUR 13.5 06/24/2021     Lipid Panel ( most recent) Lipid Panel     Component Value Date/Time   CHOL 86 12/03/2020 0000   CHOL 212 (H) 09/23/2015 1540   TRIG 48 12/23/2021 0000   HDL 43 11/08/2018 0000   HDL 41 09/23/2015 1540   CHOLHDL 3.7 11/26/2015 0815   VLDL 17 11/26/2015 0815   LDLCALC 40 12/23/2021 0000   LDLCALC 137 (H) 09/23/2015 1540   LABVLDL 34 09/23/2015 1540      Lab Results  Component Value Date   TSH 2.60 12/23/2021   TSH 2.69 06/24/2021   TSH 2.61 11/08/2018           Assessment & Plan:   1) Uncontrolled Type 2 Diabetes without complications  He presents today, accompanied by his wife, with no meter or logs (was not asked to check glucose routinely due to being off medication).  He has been treated with long term oral steroids for polymyalgias from the rheumatologist, is now in the process of tapering off, and has had high glucose as a result.  He notes he did restart his Metformin 500 mg twice daily to combat the high readings.  His POCT A1c today is 11.1%, increasing drastically from last visit of 6.1%.  His PCP did start him on Jardiance since last visit too as a result of the high glucose.  - Grant Cardenas has currently uncontrolled symptomatic type 2 DM since 66 years of age.   -Recent labs reviewed.  - I had a long discussion with him about the progressive nature of diabetes and the pathology behind its complications. -his diabetes is complicated by CAD (history of MI) and he remains at a high risk for more acute and chronic complications which include CVA, CKD, retinopathy, and  neuropathy. These are all discussed in detail with him.  - Nutritional counseling repeated at each appointment due to patients tendency to fall back in to old habits.  - The patient admits there is a room for improvement in their diet and drink choices. -  Suggestion is made for the patient to avoid simple carbohydrates from their diet including Cakes, Sweet Desserts / Pastries, Ice Cream, Soda (diet and regular), Sweet Tea, Candies, Chips, Cookies, Sweet Pastries, Store Bought Juices, Alcohol in Excess of 1-2 drinks a day, Artificial Sweeteners, Coffee Creamer, and "Sugar-free" Products. This will help patient to have stable blood glucose profile and potentially avoid unintended weight gain.   - I encouraged the patient to switch to unprocessed or minimally processed complex starch and increased protein intake (animal or plant source), fruits, and vegetables.   - Patient is advised to stick to a routine mealtimes to eat 3 meals a day and avoid unnecessary snacks (to snack only to correct hypoglycemia).  - I have approached him with the following individualized plan to manage  his diabetes and patient agrees:   -Due to his recent oral steroid use, he has fallen off track with his glucose.  He is advised to continue Metformin 500 mg po twice daily with meals and start Glipizide 2.5 mg XL daily with breakfast (has questionable allergy to sulfa meds-  is unaware what the side effects are).  I advised him to stop the Jardiance for now due to the side effect profile.  He is advised to restart monitoring blood glucose once daily, before breakfast and to call the clinic if he has readings less than 70 or above 200 for 3 tests in a row.   - he is not a good candidate for incretin therapy due to body habitus.  - Specific targets for  A1c;  LDL, HDL,  and Triglycerides were discussed with the patient.  2) Blood Pressure /Hypertension:  his blood pressure is controlled to target.   he is advised to  continue his current medications including Carvedilol 3.125 mg po twice daily and Losartan 50 mg p.o. daily with breakfast.  3) Lipids/Hyperlipidemia:    Review of his recent lipid panel from 12/23/21 showed controlled  LDL at 40 .  he  is advised to continue Lipitor 40 mg po daily at bedtime and Zetia 10 mg daily at bedtime.  Side effects and precautions discussed with him.  He does have elevated LFTs, which started soon after treatment for his psoriasis, he does follow with GI.  He does NOT drink alcohol.  4)  Weight/Diet:  his Body mass index is 22.09 kg/m.  -  he is not a candidate for weight loss. Exercise, and detailed carbohydrates information provided  -  detailed on discharge instructions.  5) Chronic Care/Health Maintenance: -he is on ACEI/ARB and Statin medications and is encouraged to initiate and continue to follow up with Ophthalmology, Dentist, Podiatrist at least yearly or according to recommendations, and advised to stay away from smoking. I have recommended yearly flu vaccine and pneumonia vaccine at least every 5 years; moderate intensity exercise for up to 150 minutes weekly; and sleep for at least 7 hours a day.  - he is advised to maintain close follow up with Lindell Spar, MD for primary care needs, as well as his other providers for optimal and coordinated care.      I spent 30 minutes in the care of the patient today including review of labs from [facility], Lipids, Thyroid Function, Hematology (current and previous including abstractions from other facilities); face-to-face time discussing  his blood glucose readings/logs, discussing hypoglycemia and hyperglycemia episodes and symptoms, medications doses, his options of short and long term treatment based on the latest standards of care / guidelines;  discussion about incorporating lifestyle medicine;  and documenting the encounter. Risk reduction counseling performed per USPSTF guidelines to reduce obesity and cardiovascular  risk factors.     Please refer to Patient Instructions for Blood Glucose Monitoring and Insulin/Medications Dosing Guide"  in media tab for additional information. Please  also refer to " Patient Self Inventory" in the Media  tab for reviewed elements of pertinent patient history.  Grant Cardenas participated in the discussions, expressed understanding, and voiced agreement with the above plans.  All questions were answered to his satisfaction. he is encouraged to contact clinic should he have any questions or concerns prior to his return visit.   Follow up plan: - Return in about 2 months (around 08/28/2022) for Diabetes F/U, Previsit labs, Bring meter and logs.  Rayetta Pigg, [facility] [address] [place], [place] [address] Phone: [phone] Fax: [phone]  06/28/2022, 12:51 PM

## 2022-07-04 ENCOUNTER — Encounter: Payer: Self-pay | Admitting: Internal Medicine

## 2022-07-05 ENCOUNTER — Ambulatory Visit (INDEPENDENT_AMBULATORY_CARE_PROVIDER_SITE_OTHER): Payer: Medicare HMO

## 2022-07-05 DIAGNOSIS — Z0000 Encounter for general adult medical examination without abnormal findings: Secondary | ICD-10-CM | POA: Diagnosis not present

## 2022-07-05 NOTE — Progress Notes (Signed)
I connected with  [name] on 07/05/22 by a audio enabled telemedicine application and verified that I am speaking with the correct person using two identifiers.  Patient Location: Home  Provider Location: Home Office  I discussed the limitations of evaluation and management by telemedicine. The patient expressed understanding and agreed to proceed.  Subjective:   Grant Cardenas is a 66 y.o. male who presents for Medicare Annual/Subsequent preventive examination.  Review of Systems     Cardiac Risk Factors include: diabetes mellitus;dyslipidemia;hypertension;male gender;family history of premature cardiovascular disease;smoking/ tobacco exposure     Objective:    There were no vitals filed for this visit. There is no height or weight on file to calculate BMI.     07/05/2022    9:48 AM 05/03/2021   11:08 AM 11/28/2020   12:25 PM 02/28/2020    8:25 AM 01/29/2016   11:10 AM 12/22/2015    3:29 PM 11/02/2015    8:00 PM  Advanced Directives  Does Patient Have a Medical Advance Directive? No Yes No No Yes Yes Yes  Type of Advance Directive  Living will   [place];Living will Living will [place]  Does patient want to make changes to medical advance directive?  No - Patient declined    No - Patient declined No - Patient declined  Copy of [place] in Chart?     No - copy requested No - copy requested   Would patient like information on creating a medical advance directive? Yes (ED - Information included in AVS)  No - Patient declined No - Patient declined       Current Medications (verified) Outpatient Encounter Medications as of 07/05/2022  Medication Sig   Albuterol Sulfate 108 (90 Base) MCG/ACT AEPB Inhale 1-2 puffs into the lungs 4 (four) times daily as needed (wheezing/shortness of breath.).    aspirin EC 81 MG tablet Take 1 tablet (81 mg total) by mouth daily.   atorvastatin (LIPITOR) 40 MG tablet TAKE ONE  TABLET BY MOUTH AT BEDTIME FOR CHOLESTEROL   calcium carbonate (OS-CAL) 1250 (500 Ca) MG chewable tablet CHEW 1-2 TABLETS BY MOUTH AT BEDTIME AS NEEDED FOR INDIGESTION   Carboxymethylcellulose Sod PF 0.25 % SOLN Place 1 drop into both eyes 4 (four) times daily as needed (dry/irritated eyes.).    carvedilol (COREG) 3.125 MG tablet Take 1 tablet (3.125 mg total) by mouth 2 (two) times daily with a meal.   Cholecalciferol 25 MCG (1000 UT) tablet Take 1 tablet by mouth daily.   clobetasol ointment (TEMOVATE) 1.27 % Apply 1 application topically 2 (two) times daily as needed (psoriasis).    empagliflozin (JARDIANCE) 25 MG TABS tablet TAKE ONE-HALF TABLET BY MOUTH EVERY MORNING FOR DIABETES AND KIDNEY   ezetimibe (ZETIA) 10 MG tablet Take 1 tablet by mouth daily.   fluticasone (CUTIVATE) 0.05 % cream Apply 1 application topically 2 (two) times daily as needed (psoriasis (face & groin areas)).    glipiZIDE (GLUCOTROL XL) 2.5 MG 24 hr tablet Take 1 tablet (2.5 mg total) by mouth daily with breakfast.   ketoconazole (NIZORAL) 2 % shampoo Apply 1 application topically 2 (two) times a week. Applied to facial hair (beard) & scalp. Leave on 10 minutes prior to washing out.   losartan (COZAAR) 50 MG tablet Take 50 mg by mouth 2 (two) times daily.   Menthol-Methyl Salicylate (THERA-GESIC) 0.5-15 % CREA APPLY MODERATE AMOUNT TO AFFECTED AREA TWICE A DAY  metFORMIN (GLUCOPHAGE) 500 MG tablet Take 500 mg by mouth 2 (two) times daily with a meal.   nitroGLYCERIN (NITROSTAT) 0.4 MG SL tablet Place 1 tablet (0.4 mg total) under the tongue every 5 (five) minutes as needed for chest pain.   pantoprazole (PROTONIX) 40 MG tablet Take 40 mg by mouth daily.   predniSONE (DELTASONE) 5 MG tablet TAKE THREE AND ONE-HALF TABLETS BY MOUTH DAILY FOR 21 DAYS, THEN TAKE THREE TABLETS DAILY FOR 21 DAYS, THEN TAKE TWO AND ONE-HALF TABLETS DAILY FOR 21 DAYS, THEN TAKE TWO TABLETS DAILY FOR 21 DAYS   umeclidinium-vilanterol (ANORO  ELLIPTA) 62.5-25 MCG/INH AEPB Inhale 1 puff into the lungs daily.   Ustekinumab (STELARA [place]) Inject 1 Dose into the skin every 3 (three) months. Every 12 weeks   No facility-administered encounter medications on file as of 07/05/2022.    Allergies (verified) Sulfa antibiotics   History: Past Medical History:  Diagnosis Date   CAD (coronary artery disease)    a. anterior ST elevation MI in 09/2015. Cardiac cath showed occluded prox LAD and 60% ostial RCA stenosis. There was also very distal disease affecting the LAD and ramus. He underwent successful angioplasty and drug-eluting stent placement to the LAD. Ejection fraction was 35-40% which improved subsequently an echo to 55-60%.   Chronic obstructive pulmonary disease, unspecified (HCC)    COPD (chronic obstructive pulmonary disease) ([place]) 03/2016   4th stage-Dr. Luan Pulling   Essential (primary) hypertension    Hyperlipidemia    Ischemic cardiomyopathy    a. EF by cath 09/13/2015: 35-40%; b. 09/14/2015: EF 55-60%, Probable akinesis of  the midanteroseptal myocardium. akinesis of the apicalanterior myocardium, GR1DD   MI (myocardial infarction) ([place])    Psoriasis    Psoriasis, unspecified    ST elevation (STEMI) myocardial infarction involving left anterior descending coronary artery ([place])  09/2015 09/13/2015   Tobacco use    Vitreous floaters of both eyes    Past Surgical History:  Procedure Laterality Date   CARDIAC CATHETERIZATION N/A 09/13/2015   Procedure: Left Heart Cath and Coronary Angiography;  Surgeon: Wellington Hampshire, MD;  Location: [facility];  Service: Cardiovascular;  Laterality: N/A;   CARDIAC CATHETERIZATION N/A 09/13/2015   Procedure: Coronary Stent Intervention;  Surgeon: Wellington Hampshire, MD;  Location: [facility];  Service: Cardiovascular;  Laterality: N/A;   CARDIAC CATHETERIZATION N/A 11/03/2015   Procedure: Left Heart Cath and Coronary Angiography;  Surgeon: Peter M Martinique, MD;  Location: [facility];  Service: Cardiovascular;  Laterality: N/A;   COLONOSCOPY N/A 01/29/2016   Procedure: COLONOSCOPY;  Surgeon: Danie Binder, MD;  [facility];  Service: Endoscopy;  Laterality: N/A;  1030-moved to 1215 office to notify    COLONOSCOPY N/A 02/28/2020   Procedure: COLONOSCOPY;  Surgeon: Daneil Dolin, MD;  [facility];  Service: Endoscopy;  Laterality: N/A;  9:30   ESOPHAGOGASTRODUODENOSCOPY N/A 01/29/2016   Procedure: ESOPHAGOGASTRODUODENOSCOPY (EGD);  Surgeon: Danie Binder, MD;  [facility];  Service: Endoscopy;  Laterality: N/A;   POLYPECTOMY  02/28/2020   Procedure: POLYPECTOMY;  Surgeon: Daneil Dolin, MD;  [facility];  Service: Endoscopy;;   Family History  Problem Relation Age of Onset   Heart Problems Mother    Heart attack Father    Heart disease Father    Cancer Other    Colon cancer Maternal Aunt    Social History   Socioeconomic History   Marital status:  Married    Spouse name: Not on file   Number of children: Not on file   Years of education: Not on file   Highest education level: Not on file  Occupational History   Not on file  Tobacco Use   Smoking status: Former    Years: 40.00    Types: Cigarettes    Quit date: 09/14/2015    Years since quitting: 6.8   Smokeless tobacco: Never  Vaping Use   Vaping Use: Former  Substance and Sexual Activity   Alcohol use: No    Alcohol/week: 0.0 standard drinks of alcohol    Comment: Previously heavy beer drinker; quit 20 years ago   Drug use: No   Sexual activity: Never  Other Topics Concern   Not on file  Social History Narrative   Not on file   Social Determinants of Health   Financial Resource Strain: Low Risk  (07/05/2022)   Overall Financial Resource Strain (CARDIA)    Difficulty of Paying Living Expenses: Not hard at all  Food Insecurity: No Food Insecurity (07/05/2022)   Hunger Vital Sign    Worried About Running Out of Food in the Last  Year: Never true    [place] in the Last Year: Never true  Transportation Needs: No Transportation Needs (07/05/2022)   PRAPARE - Hydrologist (Medical): No    Lack of Transportation (Non-Medical): No  Physical Activity: Sufficiently Active (07/05/2022)   Exercise Vital Sign    Days of Exercise per Week: 5 days    Minutes of Exercise per Session: 30 min  Stress: No Stress Concern Present (05/03/2021)   [place]    Feeling of Stress : Not at all  Social Connections: Moderately Integrated (07/05/2022)   Social Connection and Isolation Panel [NHANES]    Frequency of Communication with Friends and Family: Once a week    Frequency of Social Gatherings with Friends and Family: Twice a week    Attends Religious Services: More than 4 times per year    Active Member of [employer] or Organizations: No    Attends Music therapist: Never    Marital Status: Married    Tobacco Counseling Counseling given: Not Answered   Clinical Intake:           Diabetes: Yes CBG done?: No Did pt. bring in CBG monitor from home?: No  How often do you need to have someone help you when you read instructions, pamphlets, or other written materials from your doctor or pharmacy?: 1 - Never  Diabetic?Nutrition Risk Assessment:  Has the patient had any N/V/D within the last 2 months?  No  Does the patient have any non-healing wounds?  No  Has the patient had any unintentional weight loss or weight gain?  No   Diabetes:  Is the patient diabetic?  Yes  If diabetic, was a CBG obtained today?  No  Did the patient bring in their glucometer from home?  No  How often do you monitor your CBG's? daily.   Financial Strains and Diabetes Management:  Are you having any financial strains with the device, your supplies or your medication? No .  Does the patient want to be seen by Chronic Care Management  for management of their diabetes?  No  Would the patient like to be referred to a Nutritionist or for Diabetic Management?  No   Diabetic Exams:  Diabetic Eye  Exam: Completed at [facility] Diabetic Foot Exam: Completed      Interpreter Needed?: No      Activities of Daily Living    07/05/2022    9:55 AM  In your present state of health, do you have any difficulty performing the following activities:  Hearing? 0  Vision? 0  Difficulty concentrating or making decisions? 1  Walking or climbing stairs? 0  Dressing or bathing? 0  Doing errands, shopping? 1  Preparing Food and eating ? N  Using the Toilet? N  In the past six months, have you accidently leaked urine? N  Do you have problems with loss of bowel control? N  Managing your Medications? N  Managing your Finances? N    Patient Care Team: Lindell Spar, MD as PCP - General (Internal Medicine) Danie Binder, MD (Inactive) as Consulting Physician (Gastroenterology) Lenise Arena, FNP (Family Medicine)  Indicate any recent Medical Services you may have received from other than [facility] providers in the past year (date may be approximate).     Assessment:   This is a routine wellness examination for [place].  Hearing/Vision screen No results found.  Dietary issues and exercise activities discussed: Current Exercise Habits: The patient has a physically strenuous job, but has no regular exercise apart from work.   Goals Addressed   None    Depression Screen    01/04/2022   11:20 AM 05/03/2021   11:09 AM 05/03/2021   11:06 AM 02/18/2021   10:00 AM 04/25/2016    1:13 PM 12/22/2015    3:30 PM  PHQ 2/9 Scores  PHQ - 2 Score 0 0 0 0 0 0  PHQ- 9 Score 0    0 0    Fall Risk    07/05/2022    9:54 AM 01/04/2022   11:20 AM 05/03/2021   11:09 AM 02/18/2021   10:00 AM 12/22/2015    3:30 PM  [place] in the past year? 0 0 0 0 No  Number falls in past yr: 0 0 0 0   Injury with Fall? 0 0 0 0   Risk for fall due to :  No Fall  Risks No Fall Risks No Fall Risks   Follow up  Falls evaluation completed Falls evaluation completed Falls evaluation completed     [place]:  Any stairs in or around the home? No  If so, are there any without handrails? No  Home free of loose throw rugs in walkways, pet beds, electrical cords, etc? Yes  Adequate lighting in your home to reduce risk of falls? Yes   ASSISTIVE DEVICES UTILIZED TO PREVENT FALLS:  Life alert? No  Use of a cane, walker or w/c? No  Grab bars in the bathroom? Yes  Shower chair or bench in shower? Yes  Elevated toilet seat or a handicapped toilet? Yes     Cognitive Function:        05/03/2021   11:16 AM  6CIT Screen  What Year? 0 points  What month? 0 points  What time? 0 points  Count back from 20 0 points  Months in reverse 0 points  Repeat phrase 2 points  Total Score 2 points    Immunizations Immunization History  Administered Date(s) Administered   Fluad Quad(high Dose 65+) 07/03/2020   Influenza Inj Mdck Quad Pf 08/11/2016   Influenza, High Dose Seasonal PF 09/05/2016   Influenza, Seasonal, Injecte, Preservative Fre 06/18/2019  Influenza,inj,Quad PF,6+ Mos 07/07/2017, 07/02/2018, 06/18/2019   Influenza-Unspecified 06/18/2019, 06/21/2022   Moderna Sars-Covid-2 Vaccination 11/07/2019, 12/05/2019, 08/04/2020, 01/12/2021   Pneumococcal Conjugate-13 06/07/2016   Pneumococcal Polysaccharide-23 07/07/2017   Td (Adult) 06/07/2016   Tdap 11/03/2004   Zoster Recombinat (Shingrix) 10/04/2019, 03/20/2020    TDAP status: Up to date  Flu Vaccine status: Up to date  Pneumococcal vaccine status: Up to date  Covid-19 vaccine status: Completed vaccines  Qualifies for Shingles Vaccine? Yes   Zostavax completed No   Shingrix Completed?: Yes  Screening Tests Health Maintenance  Topic Date Due   OPHTHALMOLOGY EXAM  Never done   Hepatitis C Screening  Never done   COVID-19 Vaccine (6 - Moderna risk  series) 08/19/2021   Pneumonia Vaccine 36+ Years old (3 - PPSV23 or PCV20) 07/07/2022   Diabetic kidney evaluation - Urine ACR  08/16/2022   Diabetic kidney evaluation - GFR measurement  12/24/2022   HEMOGLOBIN A1C  12/27/2022   FOOT EXAM  06/29/2023   COLONOSCOPY (Pts 45-17yr Insurance coverage will need to be confirmed)  02/27/2030   TETANUS/TDAP  06/21/2032   INFLUENZA VACCINE  Completed   Zoster Vaccines- Shingrix  Completed   HPV VACCINES  Aged Out    Health Maintenance  Health Maintenance Due  Topic Date Due   OPHTHALMOLOGY EXAM  Never done   Hepatitis C Screening  Never done   COVID-19 Vaccine (6 - Moderna risk series) 08/19/2021   Pneumonia Vaccine 68 Years old (3 - PPSV23 or PCV20) 07/07/2022    Colorectal cancer screening: Type of screening: Colonoscopy. Completed  . Repeat every 10 years  Lung Cancer Screening: (Low Dose CT Chest recommended if Age 68-80years, 30 pack-year currently smoking OR have quit w/in 15years.) does qualify. [facility]   Additional Screening:  Hepatitis C Screening: does qualify; [facility]  Vision Screening: Recommended annual ophthalmology exams for early detection of glaucoma and other disorders of the eye. Is the patient up to date with their annual eye exam?  Yes  Who is the provider or what is the name of the office in which the patient attends annual eye exams? [facility] If pt is not established with a provider, would they like to be referred to a provider to establish care? No .   Dental Screening: Recommended annual dental exams for proper oral hygiene  Community Resource Referral / Chronic Care Management: CRR required this visit?  No   CCM required this visit?  No      Plan:     I have personally reviewed and noted the following in the patient's chart:   Medical and social history Use of alcohol, tobacco or illicit drugs  Current medications and  supplements including opioid prescriptions. Patient is not currently taking opioid prescriptions. Functional ability and status Nutritional status Physical activity Advanced directives List of other physicians Hospitalizations, surgeries, and ER visits in previous 12 months Vitals Screenings to include cognitive, depression, and falls Referrals and appointments  In addition, I have reviewed and discussed with patient certain preventive protocols, quality metrics, and best practice recommendations. A written personalized care plan for preventive services as well as general preventive health recommendations were provided to patient.     BEual Fines LPN   [id number]  Nurse Notes: Schedule your next wellness visit for 1 year at checkout

## 2022-07-05 NOTE — Patient Instructions (Addendum)
  Grant Cardenas , Thank you for taking time to come for your Medicare Wellness Visit. I appreciate your ongoing commitment to your health goals. Please review the following plan we discussed and let me know if I can assist you in the future.   These are the goals we discussed:  Goals      HEMOGLOBIN A1C < 7     Prevent falls        [facility] so we can update your chart.  This is a list of the screening recommended for you and due dates:  Health Maintenance  Topic Date Due   Eye exam for diabetics  Never done   Hepatitis C Screening: USPSTF Recommendation to screen - Ages 21-79 yo.  Never done   COVID-19 Vaccine (6 - Moderna risk series) 08/19/2021   Pneumonia Vaccine (3 - PPSV23 or PCV20) 07/07/2022   Yearly kidney health urinalysis for diabetes  08/16/2022   Yearly kidney function blood test for diabetes  12/24/2022   Hemoglobin A1C  12/27/2022   Complete foot exam   06/29/2023   Colon Cancer Screening  02/27/2030   Tetanus Vaccine  06/21/2032   Flu Shot  Completed   Zoster (Shingles) Vaccine  Completed   HPV Vaccine  Aged Out

## 2022-07-06 ENCOUNTER — Ambulatory Visit (HOSPITAL_COMMUNITY)
Admission: RE | Admit: 2022-07-06 | Discharge: 2022-07-06 | Disposition: A | Discharge status: Home / Self Care | Payer: No Typology Code available for payment source | Source: Ambulatory Visit | Attending: Physician Assistant | Admitting: Physician Assistant

## 2022-07-06 DIAGNOSIS — Z136 Encounter for screening for cardiovascular disorders: Secondary | ICD-10-CM | POA: Diagnosis present

## 2022-07-06 DIAGNOSIS — Z87891 Personal history of nicotine dependence: Secondary | ICD-10-CM | POA: Insufficient documentation

## 2022-07-08 ENCOUNTER — Telehealth: Payer: Self-pay | Admitting: Internal Medicine

## 2022-07-08 NOTE — Telephone Encounter (Signed)
Call report from [place] at [facility] on abnormal US aorta. Physician notified.

## 2022-07-08 NOTE — Telephone Encounter (Signed)
Patient returning call.

## 2022-07-08 NOTE — Telephone Encounter (Signed)
Left message for pt to call the office.

## 2022-07-08 NOTE — Telephone Encounter (Signed)
Pt advised with verbal understanding  °

## 2022-07-21 ENCOUNTER — Encounter: Payer: Self-pay | Admitting: Nurse Practitioner

## 2022-07-21 NOTE — Telephone Encounter (Signed)
Will you call and schedule follow up sooner due to high readings?-- See patient note

## 2022-07-22 ENCOUNTER — Ambulatory Visit (INDEPENDENT_AMBULATORY_CARE_PROVIDER_SITE_OTHER): Payer: No Typology Code available for payment source | Admitting: Nurse Practitioner

## 2022-07-22 ENCOUNTER — Encounter: Payer: Self-pay | Admitting: Nurse Practitioner

## 2022-07-22 VITALS — BP 97/60 | HR 71 | Ht 73.0 in | Wt 166.4 lb

## 2022-07-22 DIAGNOSIS — E1165 Type 2 diabetes mellitus with hyperglycemia: Secondary | ICD-10-CM

## 2022-07-22 MED ORDER — EMPAGLIFLOZIN 10 MG PO TABS: 10.0000 mg | ORAL_TABLET | Freq: Every day | ORAL | 0 refills | Status: DC

## 2022-07-22 MED ORDER — GLIPIZIDE ER 5 MG PO TB24: 5.0000 mg | ORAL_TABLET | Freq: Every day | ORAL | 0 refills | Status: DC

## 2022-07-22 NOTE — Progress Notes (Signed)
Endocrinology Follow Up Note       07/22/2022, 9:42 AM   Subjective:    Patient ID: Grant Cardenas, male    DOB: [birth year].  Grant Cardenas is being seen in follow up after being seen in consultation for management of currently uncontrolled symptomatic diabetes requested by  Lindell Spar, MD.   Past Medical History:  Diagnosis Date   CAD (coronary artery disease)    a. anterior ST elevation MI in 09/2015. Cardiac cath showed occluded prox LAD and 60% ostial RCA stenosis. There was also very distal disease affecting the LAD and ramus. He underwent successful angioplasty and drug-eluting stent placement to the LAD. Ejection fraction was 35-40% which improved subsequently an echo to 55-60%.   Chronic obstructive pulmonary disease, unspecified (HCC)    COPD (chronic obstructive pulmonary disease) ([place]) 03/2016   4th stage-Dr. Luan Pulling   Essential (primary) hypertension    Hyperlipidemia    Ischemic cardiomyopathy    a. EF by cath 09/13/2015: 35-40%; b. 09/14/2015: EF 55-60%, Probable akinesis of  the midanteroseptal myocardium. akinesis of the apicalanterior myocardium, GR1DD   MI (myocardial infarction) ([place])    Psoriasis    Psoriasis, unspecified    ST elevation (STEMI) myocardial infarction involving left anterior descending coronary artery ([place])  09/2015 09/13/2015   Tobacco use    Vitreous floaters of both eyes     Past Surgical History:  Procedure Laterality Date   CARDIAC CATHETERIZATION N/A 09/13/2015   Procedure: Left Heart Cath and Coronary Angiography;  Surgeon: Wellington Hampshire, MD;  Location: [facility];  Service: Cardiovascular;  Laterality: N/A;   CARDIAC CATHETERIZATION N/A 09/13/2015   Procedure: Coronary Stent Intervention;  Surgeon: Wellington Hampshire, MD;  Location: [facility];  Service: Cardiovascular;  Laterality: N/A;   CARDIAC CATHETERIZATION N/A 11/03/2015    Procedure: Left Heart Cath and Coronary Angiography;  Surgeon: Peter M Martinique, MD;  Location: [facility];  Service: Cardiovascular;  Laterality: N/A;   COLONOSCOPY N/A 01/29/2016   Procedure: COLONOSCOPY;  Surgeon: Danie Binder, MD;  [facility];  Service: Endoscopy;  Laterality: N/A;  1030-moved to 1215 office to notify    COLONOSCOPY N/A 02/28/2020   Procedure: COLONOSCOPY;  Surgeon: Daneil Dolin, MD;  [facility];  Service: Endoscopy;  Laterality: N/A;  9:30   ESOPHAGOGASTRODUODENOSCOPY N/A 01/29/2016   Procedure: ESOPHAGOGASTRODUODENOSCOPY (EGD);  Surgeon: Danie Binder, MD;  [facility];  Service: Endoscopy;  Laterality: N/A;   POLYPECTOMY  02/28/2020   Procedure: POLYPECTOMY;  Surgeon: Daneil Dolin, MD;  [facility];  Service: Endoscopy;;    Social History   Socioeconomic History   Marital status: Married    Spouse name: Not on file   Number of children: Not on file   Years of education: Not on file   Highest education level: Not on file  Occupational History   Not on file  Tobacco Use   Smoking status: Former    Years: 40.00    Types: Cigarettes    Quit date: 09/14/2015    Years since quitting: 6.8   Smokeless tobacco: Never  Vaping  Use   Vaping Use: Former  Substance and Sexual Activity   Alcohol use: No    Alcohol/week: 0.0 standard drinks of alcohol    Comment: Previously heavy beer drinker; quit 20 years ago   Drug use: No   Sexual activity: Never  Other Topics Concern   Not on file  Social History Narrative   Not on file   Social Determinants of Health   Financial Resource Strain: Low Risk  (07/05/2022)   Overall Financial Resource Strain (CARDIA)    Difficulty of Paying Living Expenses: Not hard at all  Food Insecurity: No Food Insecurity (07/05/2022)   Hunger Vital Sign    Worried About Running Out of Food in the Last Year: Never true    Ran Out of Food in the Last Year: Never true   Transportation Needs: No Transportation Needs (07/05/2022)   PRAPARE - Hydrologist (Medical): No    Lack of Transportation (Non-Medical): No  Physical Activity: Sufficiently Active (07/05/2022)   Exercise Vital Sign    Days of Exercise per Week: 5 days    Minutes of Exercise per Session: 30 min  Stress: No Stress Concern Present (05/03/2021)   [place]    Feeling of Stress : Not at all  Social Connections: Moderately Integrated (07/05/2022)   Social Connection and Isolation Panel [NHANES]    Frequency of Communication with Friends and Family: Once a week    Frequency of Social Gatherings with Friends and Family: Twice a week    Attends Religious Services: More than 4 times per year    Active Member of [employer] or Organizations: No    Attends Archivist Meetings: Never    Marital Status: Married    Family History  Problem Relation Age of Onset   Heart Problems Mother    Heart attack Father    Heart disease Father    Cancer Other    Colon cancer Maternal Aunt     Outpatient Encounter Medications as of 07/22/2022  Medication Sig   Albuterol Sulfate 108 (90 Base) MCG/ACT AEPB Inhale 1-2 puffs into the lungs 4 (four) times daily as needed (wheezing/shortness of breath.).    aspirin EC 81 MG tablet Take 1 tablet (81 mg total) by mouth daily.   atorvastatin (LIPITOR) 40 MG tablet TAKE ONE TABLET BY MOUTH AT BEDTIME FOR CHOLESTEROL   calcium carbonate (OS-CAL) 1250 (500 Ca) MG chewable tablet CHEW 1-2 TABLETS BY MOUTH AT BEDTIME AS NEEDED FOR INDIGESTION   Carboxymethylcellulose Sod PF 0.25 % SOLN Place 1 drop into both eyes 4 (four) times daily as needed (dry/irritated eyes.).    carvedilol (COREG) 3.125 MG tablet Take 1 tablet (3.125 mg total) by mouth 2 (two) times daily with a meal.   Cholecalciferol 25 MCG (1000 UT) tablet Take 1 tablet by mouth daily.   clobetasol ointment  (TEMOVATE) 2.44 % Apply 1 application topically 2 (two) times daily as needed (psoriasis).    ezetimibe (ZETIA) 10 MG tablet Take 1 tablet by mouth daily.   fluticasone (CUTIVATE) 0.05 % cream Apply 1 application topically 2 (two) times daily as needed (psoriasis (face & groin areas)).    ketoconazole (NIZORAL) 2 % shampoo Apply 1 application topically 2 (two) times a week. Applied to facial hair (beard) & scalp. Leave on 10 minutes prior to washing out.   losartan (COZAAR) 50 MG tablet Take 50 mg by mouth 2 (two) times daily.  Menthol-Methyl Salicylate (THERA-GESIC) 0.5-15 % CREA APPLY MODERATE AMOUNT TO AFFECTED AREA TWICE A DAY   metFORMIN (GLUCOPHAGE) 500 MG tablet Take 500 mg by mouth 2 (two) times daily with a meal.   nitroGLYCERIN (NITROSTAT) 0.4 MG SL tablet Place 1 tablet (0.4 mg total) under the tongue every 5 (five) minutes as needed for chest pain.   pantoprazole (PROTONIX) 40 MG tablet Take 40 mg by mouth daily.   predniSONE (DELTASONE) 5 MG tablet TAKE THREE AND ONE-HALF TABLETS BY MOUTH DAILY FOR 21 DAYS, THEN TAKE THREE TABLETS DAILY FOR 21 DAYS, THEN TAKE TWO AND ONE-HALF TABLETS DAILY FOR 21 DAYS, THEN TAKE TWO TABLETS DAILY FOR 21 DAYS   umeclidinium-vilanterol (ANORO ELLIPTA) 62.5-25 MCG/INH AEPB Inhale 1 puff into the lungs daily.   Ustekinumab (STELARA [place]) Inject 1 Dose into the skin every 3 (three) months. Every 12 weeks   [DISCONTINUED] glipiZIDE (GLUCOTROL XL) 2.5 MG 24 hr tablet Take 1 tablet (2.5 mg total) by mouth daily with breakfast.   empagliflozin (JARDIANCE) 10 MG TABS tablet Take 1 tablet (10 mg total) by mouth daily.   glipiZIDE (GLUCOTROL XL) 5 MG 24 hr tablet Take 1 tablet (5 mg total) by mouth daily with breakfast.   [DISCONTINUED] empagliflozin (JARDIANCE) 25 MG TABS tablet TAKE ONE-HALF TABLET BY MOUTH EVERY MORNING FOR DIABETES AND KIDNEY (Patient not taking: Reported on 07/22/2022)   No facility-administered encounter medications on file as of 07/22/2022.     ALLERGIES: Allergies  Allergen Reactions   Sulfa Antibiotics Other (See Comments)    Sulfa drugs (unaware of this reaction per patient spouse)      VACCINATION STATUS: Immunization History  Administered Date(s) Administered   Fluad Quad(high Dose 65+) 07/03/2020   Influenza Inj Mdck Quad Pf 08/11/2016   Influenza, High Dose Seasonal PF 09/05/2016   Influenza, Seasonal, Injecte, Preservative Fre 06/18/2019   Influenza,inj,Quad PF,6+ Mos 07/07/2017, 07/02/2018, 06/18/2019   Influenza-Unspecified 06/18/2019, 06/21/2022   Moderna Sars-Covid-2 Vaccination 11/07/2019, 12/05/2019, 08/04/2020, 01/12/2021   Pneumococcal Conjugate-13 06/07/2016   Pneumococcal Polysaccharide-23 07/07/2017   Td (Adult) 06/07/2016   Tdap 11/03/2004   Zoster Recombinat (Shingrix) 10/04/2019, 03/20/2020    Diabetes He presents for his follow-up diabetic visit. He has type 2 diabetes mellitus. Onset time: Diagnosed at approx age of 47. His disease course has been worsening. There are no hypoglycemic associated symptoms. Associated symptoms include blurred vision, polydipsia, polyuria and weight loss. Pertinent negatives for diabetes include no fatigue. There are no hypoglycemic complications. Symptoms are worsening. Diabetic complications include heart disease and nephropathy. Risk factors for coronary artery disease include diabetes mellitus, dyslipidemia, hypertension and male sex. Current diabetic treatment includes diet and oral agent (dual therapy). He is compliant with treatment most of the time. His weight is decreasing steadily. He is following a generally healthy diet. When asked about meal planning, he reported none. He has not had a previous visit with a dietitian. He participates in exercise intermittently. His home blood glucose trend is fluctuating minimally. His overall blood glucose range is >200 mg/dl. (He presents today, accompanied by his wife, with his meter and logs showing continued gross  hyperglycemia despite restarting some of his antidiabetic medications last visit.  He was not due for another A1c today.  Glucose is averaging 280-300 most days (fasting).  He does have polyuria, blurred vision, increased thirst, and weight loss.  He continues to take his Prednisone, tapering off slowly.  ) An ACE inhibitor/angiotensin II receptor blocker is being taken. He does not see  a podiatrist.Eye exam is current.  Hyperlipidemia This is a chronic problem. The current episode started more than 1 year ago. The problem is controlled. Recent lipid tests were reviewed and are normal. Exacerbating diseases include diabetes. Factors aggravating his hyperlipidemia include beta blockers. Current antihyperlipidemic treatment includes statins. The current treatment provides significant improvement of lipids. There are no compliance problems.  Risk factors for coronary artery disease include diabetes mellitus, dyslipidemia, hypertension and male sex.  Hypertension This is a chronic problem. The current episode started more than 1 year ago. The problem has been resolved since onset. The problem is controlled. Associated symptoms include blurred vision. There are no associated agents to hypertension. Risk factors for coronary artery disease include diabetes mellitus, dyslipidemia and male gender. Past treatments include angiotensin blockers and beta blockers. The current treatment provides moderate improvement. There are no compliance problems.  Hypertensive end-organ damage includes CAD/MI.    Review of systems  Constitutional: + rapidly decreasing body weight,  current Body mass index is 21.95 kg/m. , + fatigue, no subjective hyperthermia, no subjective hypothermia Eyes: + blurry vision, no xerophthalmia ENT: no sore throat, no nodules palpated in throat, no dysphagia/odynophagia, no hoarseness Cardiovascular: no chest pain, no shortness of breath, no palpitations, no leg swelling Respiratory: no cough, no  shortness of breath Gastrointestinal: no nausea/vomiting/diarrhea Genitourinary: + polyuria Musculoskeletal: no muscle/joint aches Skin: no rashes, no hyperemia Neurological: no tremors, no numbness, no tingling, no dizziness Psychiatric: no depression, no anxiety  Objective:     BP 97/60 (BP Location: Left Arm, Patient Position: Sitting, Cuff Size: Normal)   Pulse 71   Ht '6\' 1"'$  (1.854 m)   Wt 166 lb 6.4 oz (75.5 kg)   BMI 21.95 kg/m   Wt Readings from Last 3 Encounters:  07/22/22 166 lb 6.4 oz (75.5 kg)  06/28/22 167 lb 6.4 oz (75.9 kg)  03/23/22 178 lb 2 oz (80.8 kg)     BP Readings from Last 3 Encounters:  07/22/22 97/60  06/28/22 112/76  03/23/22 100/70     Physical Exam- Limited  Constitutional:  Body mass index is 21.95 kg/m. , not in acute distress, anxious state of mind Eyes:  EOMI, no exophthalmos Neck: Supple Cardiovascular: RRR, no murmurs, rubs, or gallops, no edema Respiratory: Adequate breathing efforts, no crackles, rales, rhonchi, or wheezing Musculoskeletal: no gross deformities, strength intact in all four extremities, no gross restriction of joint movements Skin:  no rashes, no hyperemia Neurological: no tremor with outstretched hands   Diabetic Foot Exam - Simple   No data filed     CMP ( most recent) CMP     Component Value Date/Time   NA 130 (L) 11/28/2020 1328   NA 140 11/08/2018 0000   K 4.5 11/28/2020 1328   CL 99 11/28/2020 1328   CO2 19 (L) 11/28/2020 1328   GLUCOSE 448 (H) 11/28/2020 1328   BUN 12 12/23/2021 0000   CREATININE 1.0 12/23/2021 0000   CREATININE 1.14 11/28/2020 1328   CALCIUM 9.4 06/24/2021 0000   PROT 7.7 11/28/2020 1328   PROT 7.2 09/23/2015 1540   ALBUMIN 4.4 11/28/2020 1328   ALBUMIN 4.4 09/23/2015 1540   AST 45 (A) 12/23/2021 0000   ALT 159 (A) 12/23/2021 0000   ALKPHOS 101 11/28/2020 1328   BILITOT 2.7 (H) 11/28/2020 1328   BILITOT 0.9 09/23/2015 1540   GFRNONAA >60 11/28/2020 1328   GFRAA 81  06/24/2021 0000     Diabetic Labs (most recent): Lab Results  Component Value  Date   HGBA1C 11.1 (A) 06/28/2022   HGBA1C 6.1 12/23/2021   HGBA1C 6.2 08/16/2021   MICROALBUR 30 12/23/2021   MICROALBUR 10 08/16/2021   MICROALBUR 13.5 06/24/2021     Lipid Panel ( most recent) Lipid Panel     Component Value Date/Time   CHOL 86 12/03/2020 0000   CHOL 212 (H) 09/23/2015 1540   TRIG 48 12/23/2021 0000   HDL 43 11/08/2018 0000   HDL 41 09/23/2015 1540   CHOLHDL 3.7 11/26/2015 0815   VLDL 17 11/26/2015 0815   LDLCALC 40 12/23/2021 0000   LDLCALC 137 (H) 09/23/2015 1540   LABVLDL 34 09/23/2015 1540      Lab Results  Component Value Date   TSH 2.60 12/23/2021   TSH 2.69 06/24/2021   TSH 2.61 11/08/2018           Assessment & Plan:   1) Uncontrolled Type 2 Diabetes without complications  He presents today, accompanied by his wife, with his meter and logs showing continued gross hyperglycemia despite restarting some of his antidiabetic medications last visit.  He was not due for another A1c today.  Glucose is averaging 280-300 most days (fasting).  He does have polyuria, blurred vision, increased thirst, and weight loss.  He continues to take his Prednisone, tapering off slowly.    - Grant Cardenas has currently uncontrolled symptomatic type 2 DM since 66 years of age.   -Recent labs reviewed.  - I had a long discussion with him about the progressive nature of diabetes and the pathology behind its complications. -his diabetes is complicated by CAD (history of MI) and he remains at a high risk for more acute and chronic complications which include CVA, CKD, retinopathy, and neuropathy. These are all discussed in detail with him.  - Nutritional counseling repeated at each appointment due to patients tendency to fall back in to old habits.  - The patient admits there is a room for improvement in their diet and drink choices. -  Suggestion is made for the patient to  avoid simple carbohydrates from their diet including Cakes, Sweet Desserts / Pastries, Ice Cream, Soda (diet and regular), Sweet Tea, Candies, Chips, Cookies, Sweet Pastries, Store Bought Juices, Alcohol in Excess of 1-2 drinks a day, Artificial Sweeteners, Coffee Creamer, and "Sugar-free" Products. This will help patient to have stable blood glucose profile and potentially avoid unintended weight gain.   - I encouraged the patient to switch to unprocessed or minimally processed complex starch and increased protein intake (animal or plant source), fruits, and vegetables.   - Patient is advised to stick to a routine mealtimes to eat 3 meals a day and avoid unnecessary snacks (to snack only to correct hypoglycemia).  - I have approached him with the following individualized plan to manage  his diabetes and patient agrees:   -Due to his recent oral steroid use, he has fallen off track with his glucose.  He is advised to continue Metformin 500 mg po twice daily with meals (cannot increase due to recent decline in kidney function), increase Glipizide to 5 mg XL daily with breakfast and restart Jardiance 10 mg po daily (new scripts sent to pharmacy).    I did advise wife to reach out to Rheumatology to let them know about his trouble managing his glucose since the steroid started to see what they recommend.  He is advised to restart monitoring blood glucose once daily, before breakfast and to call the clinic if he has readings  less than 70 or above 200 for 3 tests in a row.   - he is not a good candidate for incretin therapy due to body habitus.  - Specific targets for  A1c;  LDL, HDL,  and Triglycerides were discussed with the patient.  2) Blood Pressure /Hypertension:  his blood pressure is controlled to target.   he is advised to continue his current medications including Carvedilol 3.125 mg po twice daily and Losartan 50 mg p.o. daily with breakfast.  3) Lipids/Hyperlipidemia:    Review of his  recent lipid panel from 12/23/21 showed controlled  LDL at 40 .  he  is advised to continue Lipitor 40 mg po daily at bedtime and Zetia 10 mg daily at bedtime.  Side effects and precautions discussed with him.  He does have elevated LFTs, which started soon after treatment for his psoriasis, he does follow with GI.  He does NOT drink alcohol.  4)  Weight/Diet:  his Body mass index is 21.95 kg/m.  -  he is not a candidate for weight loss. Exercise, and detailed carbohydrates information provided  -  detailed on discharge instructions.  5) Chronic Care/Health Maintenance: -he is on ACEI/ARB and Statin medications and is encouraged to initiate and continue to follow up with Ophthalmology, Dentist, Podiatrist at least yearly or according to recommendations, and advised to stay away from smoking. I have recommended yearly flu vaccine and pneumonia vaccine at least every 5 years; moderate intensity exercise for up to 150 minutes weekly; and sleep for at least 7 hours a day.  - he is advised to maintain close follow up with Lindell Spar, MD for primary care needs, as well as his other providers for optimal and coordinated care.      I spent 38 minutes in the care of the patient today including review of labs from [facility], Lipids, Thyroid Function, Hematology (current and previous including abstractions from other facilities); face-to-face time discussing  his blood glucose readings/logs, discussing hypoglycemia and hyperglycemia episodes and symptoms, medications doses, his options of short and long term treatment based on the latest standards of care / guidelines;  discussion about incorporating lifestyle medicine;  and documenting the encounter. Risk reduction counseling performed per USPSTF guidelines to reduce obesity and cardiovascular risk factors.     Please refer to Patient Instructions for Blood Glucose Monitoring and Insulin/Medications Dosing Guide"  in media tab for additional information. Please   also refer to " Patient Self Inventory" in the Media  tab for reviewed elements of pertinent patient history.  Grant Cardenas participated in the discussions, expressed understanding, and voiced agreement with the above plans.  All questions were answered to his satisfaction. he is encouraged to contact clinic should he have any questions or concerns prior to his return visit.   Follow up plan: - Return in about 1 month (around 08/22/2022) for Diabetes F/U, Bring meter and logs.  Rayetta Pigg, [facility] [address] [place], [place] [address] Phone: [phone] Fax: [phone]  07/22/2022, 9:42 AM

## 2022-08-22 DIAGNOSIS — E119 Type 2 diabetes mellitus without complications: Secondary | ICD-10-CM | POA: Diagnosis not present

## 2022-08-23 LAB — COMPREHENSIVE METABOLIC PANEL
ALT: 33 IU/L (ref 0–44)
AST: 26 IU/L (ref 0–40)
Albumin/Globulin Ratio: 2 (ref 1.2–2.2)
Albumin: 4.6 g/dL (ref 3.9–4.9)
Alkaline Phosphatase: 50 IU/L (ref 44–121)
BUN/Creatinine Ratio: 18 (ref 10–24)
BUN: 23 mg/dL (ref 8–27)
Bilirubin Total: 1.3 mg/dL — ABNORMAL HIGH (ref 0.0–1.2)
CO2: 21 mmol/L (ref 20–29)
Calcium: 10.3 mg/dL — ABNORMAL HIGH (ref 8.6–10.2)
Chloride: 103 mmol/L (ref 96–106)
Creatinine, Ser: 1.27 mg/dL (ref 0.76–1.27)
Globulin, Total: 2.3 g/dL (ref 1.5–4.5)
Glucose: 142 mg/dL — ABNORMAL HIGH (ref 70–99)
Potassium: 4.8 mmol/L (ref 3.5–5.2)
Sodium: 138 mmol/L (ref 134–144)
Total Protein: 6.9 g/dL (ref 6.0–8.5)
eGFR: 62 mL/min/{1.73_m2} (ref >59)

## 2022-08-29 ENCOUNTER — Ambulatory Visit (INDEPENDENT_AMBULATORY_CARE_PROVIDER_SITE_OTHER): Payer: No Typology Code available for payment source | Admitting: Nurse Practitioner

## 2022-08-29 ENCOUNTER — Encounter: Payer: Self-pay | Admitting: Nurse Practitioner

## 2022-08-29 VITALS — BP 95/58 | HR 66 | Ht 73.0 in | Wt 170.4 lb

## 2022-08-29 DIAGNOSIS — E1165 Type 2 diabetes mellitus with hyperglycemia: Secondary | ICD-10-CM

## 2022-08-29 MED ORDER — EMPAGLIFLOZIN 10 MG PO TABS: 10.0000 mg | ORAL_TABLET | Freq: Every day | ORAL | 0 refills | Status: DC

## 2022-08-29 MED ORDER — METFORMIN HCL 500 MG PO TABS: 500.0000 mg | ORAL_TABLET | Freq: Two times a day (BID) | ORAL | 3 refills | Status: DC

## 2022-08-29 NOTE — Progress Notes (Signed)
Endocrinology Follow Up Note       08/29/2022, 12:34 PM   Subjective:    Patient ID: Grant Cardenas, male    DOB: 1956/05/02.  Grant Cardenas is being seen in follow up after being seen in consultation for management of currently uncontrolled symptomatic diabetes requested by  Lindell Spar, MD.   Past Medical History:  Diagnosis Date   CAD (coronary artery disease)    a. anterior ST elevation MI in 09/2015. Cardiac cath showed occluded prox LAD and 60% ostial RCA stenosis. There was also very distal disease affecting the LAD and ramus. He underwent successful angioplasty and drug-eluting stent placement to the LAD. Ejection fraction was 35-40% which improved subsequently an echo to 55-60%.   Chronic obstructive pulmonary disease, unspecified (HCC)    COPD (chronic obstructive pulmonary disease) ([place]) 03/2016   4th stage-Dr. Luan Pulling   Essential (primary) hypertension    Hyperlipidemia    Ischemic cardiomyopathy    a. EF by cath 09/13/2015: 35-40%; b. 09/14/2015: EF 55-60%, Probable akinesis of  the midanteroseptal myocardium. akinesis of the apicalanterior myocardium, GR1DD   MI (myocardial infarction) ([place])    Psoriasis    Psoriasis, unspecified    ST elevation (STEMI) myocardial infarction involving left anterior descending coronary artery ([place])  09/2015 09/13/2015   Tobacco use    Vitreous floaters of both eyes     Past Surgical History:  Procedure Laterality Date   CARDIAC CATHETERIZATION N/A 09/13/2015   Procedure: Left Heart Cath and Coronary Angiography;  Surgeon: Wellington Hampshire, MD;  Location: [facility];  Service: Cardiovascular;  Laterality: N/A;   CARDIAC CATHETERIZATION N/A 09/13/2015   Procedure: Coronary Stent Intervention;  Surgeon: Wellington Hampshire, MD;  Location: [facility];  Service: Cardiovascular;  Laterality: N/A;   CARDIAC CATHETERIZATION N/A 11/03/2015    Procedure: Left Heart Cath and Coronary Angiography;  Surgeon: Peter M Martinique, MD;  Location: [facility];  Service: Cardiovascular;  Laterality: N/A;   COLONOSCOPY N/A 01/29/2016   Procedure: COLONOSCOPY;  Surgeon: Danie Binder, MD;  [facility];  Service: Endoscopy;  Laterality: N/A;  1030-moved to 1215 office to notify    COLONOSCOPY N/A 02/28/2020   Procedure: COLONOSCOPY;  Surgeon: Daneil Dolin, MD;  [facility];  Service: Endoscopy;  Laterality: N/A;  9:30   ESOPHAGOGASTRODUODENOSCOPY N/A 01/29/2016   Procedure: ESOPHAGOGASTRODUODENOSCOPY (EGD);  Surgeon: Danie Binder, MD;  [facility];  Service: Endoscopy;  Laterality: N/A;   POLYPECTOMY  02/28/2020   Procedure: POLYPECTOMY;  Surgeon: Daneil Dolin, MD;  [facility];  Service: Endoscopy;;    Social History   Socioeconomic History   Marital status: Married    Spouse name: Not on file   Number of children: Not on file   Years of education: Not on file   Highest education level: Not on file  Occupational History   Not on file  Tobacco Use   Smoking status: Former    Years: 40.00    Types: Cigarettes    Quit date: 09/14/2015    Years since quitting: 6.9   Smokeless tobacco: Never  Vaping  Use   Vaping Use: Former  Substance and Sexual Activity   Alcohol use: No    Alcohol/week: 0.0 standard drinks of alcohol    Comment: Previously heavy beer drinker; quit 20 years ago   Drug use: No   Sexual activity: Never  Other Topics Concern   Not on file  Social History Narrative   Not on file   Social Determinants of Health   Financial Resource Strain: Low Risk  (07/05/2022)   Overall Financial Resource Strain (CARDIA)    Difficulty of Paying Living Expenses: Not hard at all  Food Insecurity: No Food Insecurity (07/05/2022)   Hunger Vital Sign    Worried About Running Out of Food in the Last Year: Never true    Ran Out of Food in the Last Year: Never true   Transportation Needs: No Transportation Needs (07/05/2022)   PRAPARE - Hydrologist (Medical): No    Lack of Transportation (Non-Medical): No  Physical Activity: Sufficiently Active (07/05/2022)   Exercise Vital Sign    Days of Exercise per Week: 5 days    Minutes of Exercise per Session: 30 min  Stress: No Stress Concern Present (05/03/2021)   [place]    Feeling of Stress : Not at all  Social Connections: Moderately Integrated (07/05/2022)   Social Connection and Isolation Panel [NHANES]    Frequency of Communication with Friends and Family: Once a week    Frequency of Social Gatherings with Friends and Family: Twice a week    Attends Religious Services: More than 4 times per year    Active Member of [employer] or Organizations: No    Attends Archivist Meetings: Never    Marital Status: Married    Family History  Problem Relation Age of Onset   Heart Problems Mother    Heart attack Father    Heart disease Father    Cancer Other    Colon cancer Maternal Aunt     Outpatient Encounter Medications as of 08/29/2022  Medication Sig   Albuterol Sulfate 108 (90 Base) MCG/ACT AEPB Inhale 1-2 puffs into the lungs 4 (four) times daily as needed (wheezing/shortness of breath.).    aspirin EC 81 MG tablet Take 1 tablet (81 mg total) by mouth daily.   atorvastatin (LIPITOR) 40 MG tablet TAKE ONE TABLET BY MOUTH AT BEDTIME FOR CHOLESTEROL   Calcium Carb-Cholecalciferol 600-10 MG-MCG TABS Take 1 tablet by mouth 2 (two) times daily.   calcium carbonate (OS-CAL) 1250 (500 Ca) MG chewable tablet CHEW 1-2 TABLETS BY MOUTH AT BEDTIME AS NEEDED FOR INDIGESTION   Carboxymethylcellulose Sod PF 0.25 % SOLN Place 1 drop into both eyes 4 (four) times daily as needed (dry/irritated eyes.).    carvedilol (COREG) 3.125 MG tablet Take 1 tablet (3.125 mg total) by mouth 2 (two) times daily with a meal.    Cholecalciferol 25 MCG (1000 UT) tablet Take 1 tablet by mouth daily.   clobetasol ointment (TEMOVATE) 5.40 % Apply 1 application topically 2 (two) times daily as needed (psoriasis).    ezetimibe (ZETIA) 10 MG tablet Take 1 tablet by mouth daily.   fluticasone (CUTIVATE) 0.05 % cream Apply 1 application topically 2 (two) times daily as needed (psoriasis (face & groin areas)).    ketoconazole (NIZORAL) 2 % shampoo Apply 1 application topically 2 (two) times a week. Applied to facial hair (beard) & scalp. Leave on 10 minutes prior to washing out.  losartan (COZAAR) 50 MG tablet Take 50 mg by mouth 2 (two) times daily.   Menthol-Methyl Salicylate (THERA-GESIC) 0.5-15 % CREA APPLY MODERATE AMOUNT TO AFFECTED AREA TWICE A DAY   nitroGLYCERIN (NITROSTAT) 0.4 MG SL tablet Place 1 tablet (0.4 mg total) under the tongue every 5 (five) minutes as needed for chest pain.   pantoprazole (PROTONIX) 40 MG tablet Take 40 mg by mouth daily.   predniSONE (DELTASONE) 5 MG tablet TAKE THREE AND ONE-HALF TABLETS BY MOUTH DAILY FOR 21 DAYS, THEN TAKE THREE TABLETS DAILY FOR 21 DAYS, THEN TAKE TWO AND ONE-HALF TABLETS DAILY FOR 21 DAYS, THEN TAKE TWO TABLETS DAILY FOR 21 DAYS   umeclidinium-vilanterol (ANORO ELLIPTA) 62.5-25 MCG/INH AEPB Inhale 1 puff into the lungs daily.   Ustekinumab (STELARA [place]) Inject 1 Dose into the skin every 3 (three) months. Every 12 weeks   [DISCONTINUED] empagliflozin (JARDIANCE) 10 MG TABS tablet Take 1 tablet (10 mg total) by mouth daily.   [DISCONTINUED] glipiZIDE (GLUCOTROL XL) 5 MG 24 hr tablet Take 1 tablet (5 mg total) by mouth daily with breakfast.   [DISCONTINUED] metFORMIN (GLUCOPHAGE) 500 MG tablet Take 500 mg by mouth 2 (two) times daily with a meal.   empagliflozin (JARDIANCE) 10 MG TABS tablet Take 1 tablet (10 mg total) by mouth daily.   metFORMIN (GLUCOPHAGE) 500 MG tablet Take 1 tablet (500 mg total) by mouth 2 (two) times daily with a meal.   No facility-administered  encounter medications on file as of 08/29/2022.    ALLERGIES: Allergies  Allergen Reactions   Sulfa Antibiotics Other (See Comments)    Sulfa drugs (unaware of this reaction per patient spouse)      VACCINATION STATUS: Immunization History  Administered Date(s) Administered   Fluad Quad(high Dose 65+) 07/03/2020   Influenza Inj Mdck Quad Pf 08/11/2016   Influenza, High Dose Seasonal PF 09/05/2016   Influenza, Seasonal, Injecte, Preservative Fre 06/18/2019   Influenza,inj,Quad PF,6+ Mos 07/07/2017, 07/02/2018, 06/18/2019   Influenza-Unspecified 06/18/2019, 06/21/2022   Moderna Sars-Covid-2 Vaccination 11/07/2019, 12/05/2019, 08/04/2020, 01/12/2021   Pneumococcal Conjugate-13 06/07/2016   Pneumococcal Polysaccharide-23 07/07/2017   Td (Adult) 06/07/2016   Tdap 11/03/2004   Zoster Recombinat (Shingrix) 10/04/2019, 03/20/2020    Diabetes He presents for his follow-up diabetic visit. He has type 2 diabetes mellitus. Onset time: Diagnosed at approx age of 15. His disease course has been improving. There are no hypoglycemic associated symptoms. (He did not have any symptoms of hypoglycemia, despite glucose reading of 21. ) Pertinent negatives for diabetes include no blurred vision, no fatigue, no polydipsia, no polyuria and no weight loss. There are no hypoglycemic complications. Symptoms are improving. Diabetic complications include heart disease and nephropathy. Risk factors for coronary artery disease include diabetes mellitus, dyslipidemia, hypertension and male sex. Current diabetic treatment includes diet and oral agent (triple therapy). He is compliant with treatment most of the time. His weight is increasing steadily. He is following a generally healthy diet. When asked about meal planning, he reported none. He has not had a previous visit with a dietitian. He participates in exercise intermittently. His home blood glucose trend is decreasing steadily. His overall blood glucose range is  70-90 mg/dl. (He presents today, accompanied by his wife, with his meter and logs showing greatly improved glycemic profile.  He is continuing to reduce his dose of Prednisone per his Rheumatologist recommendations.  Analysis of his meter shows 7-day average of 84, 14-day average of 74, 30-day average of 87.  He did have low  of 21 but did not have any symptoms of such.) An ACE inhibitor/angiotensin II receptor blocker is being taken. He does not see a podiatrist.Eye exam is current.  Hyperlipidemia This is a chronic problem. The current episode started more than 1 year ago. The problem is controlled. Recent lipid tests were reviewed and are normal. Exacerbating diseases include diabetes. Factors aggravating his hyperlipidemia include beta blockers. Current antihyperlipidemic treatment includes statins. The current treatment provides significant improvement of lipids. There are no compliance problems.  Risk factors for coronary artery disease include diabetes mellitus, dyslipidemia, hypertension and male sex.  Hypertension This is a chronic problem. The current episode started more than 1 year ago. The problem has been resolved since onset. The problem is controlled. Pertinent negatives include no blurred vision. There are no associated agents to hypertension. Risk factors for coronary artery disease include diabetes mellitus, dyslipidemia and male gender. Past treatments include angiotensin blockers and beta blockers. The current treatment provides moderate improvement. There are no compliance problems.  Hypertensive end-organ damage includes CAD/MI.    Review of systems  Constitutional: + steadily increasing body weight,  current Body mass index is 22.48 kg/m. , + fatigue, no subjective hyperthermia, no subjective hypothermia Eyes: + blurry vision (recently diagnosed with cataracts), no xerophthalmia ENT: no sore throat, no nodules palpated in throat, no dysphagia/odynophagia, no  hoarseness Cardiovascular: no chest pain, no shortness of breath, no palpitations, no leg swelling Respiratory: no cough, no shortness of breath Gastrointestinal: no nausea/vomiting/diarrhea Genitourinary: + polyuria-improved Musculoskeletal: no muscle/joint aches Skin: no rashes, no hyperemia Neurological: no tremors, no numbness, no tingling, no dizziness Psychiatric: no depression, no anxiety  Objective:     BP (!) 95/58 (BP Location: Left Arm, Patient Position: Sitting, Cuff Size: Normal)   Pulse 66   Ht '6\' 1"'$  (1.854 m)   Wt 170 lb 6.4 oz (77.3 kg)   BMI 22.48 kg/m   Wt Readings from Last 3 Encounters:  08/29/22 170 lb 6.4 oz (77.3 kg)  07/22/22 166 lb 6.4 oz (75.5 kg)  06/28/22 167 lb 6.4 oz (75.9 kg)     BP Readings from Last 3 Encounters:  08/29/22 (!) 95/58  07/22/22 97/60  06/28/22 112/76     Physical Exam- Limited  Constitutional:  Body mass index is 22.48 kg/m. , not in acute distress, anxious state of mind Eyes:  EOMI, no exophthalmos Musculoskeletal: no gross deformities, strength intact in all four extremities, no gross restriction of joint movements Skin:  no rashes, no hyperemia Neurological: no tremor with outstretched hands   Diabetic Foot Exam - Simple   No data filed     CMP ( most recent) CMP     Component Value Date/Time   NA 138 08/22/2022 1156   K 4.8 08/22/2022 1156   CL 103 08/22/2022 1156   CO2 21 08/22/2022 1156   GLUCOSE 142 (H) 08/22/2022 1156   GLUCOSE 448 (H) 11/28/2020 1328   BUN 23 08/22/2022 1156   CREATININE 1.27 08/22/2022 1156   CALCIUM 10.3 (H) 08/22/2022 1156   PROT 6.9 08/22/2022 1156   ALBUMIN 4.6 08/22/2022 1156   AST 26 08/22/2022 1156   ALT 33 08/22/2022 1156   ALKPHOS 50 08/22/2022 1156   BILITOT 1.3 (H) 08/22/2022 1156   GFRNONAA >60 11/28/2020 1328   GFRAA 81 06/24/2021 0000     Diabetic Labs (most recent): Lab Results  Component Value Date   HGBA1C 11.1 (A) 06/28/2022   HGBA1C 6.1 12/23/2021    HGBA1C 6.2 08/16/2021  MICROALBUR 30 12/23/2021   MICROALBUR 10 08/16/2021   MICROALBUR 13.5 06/24/2021     Lipid Panel ( most recent) Lipid Panel     Component Value Date/Time   CHOL 86 12/03/2020 0000   CHOL 212 (H) 09/23/2015 1540   TRIG 48 12/23/2021 0000   HDL 43 11/08/2018 0000   HDL 41 09/23/2015 1540   CHOLHDL 3.7 11/26/2015 0815   VLDL 17 11/26/2015 0815   LDLCALC 40 12/23/2021 0000   LDLCALC 137 (H) 09/23/2015 1540   LABVLDL 34 09/23/2015 1540      Lab Results  Component Value Date   TSH 2.60 12/23/2021   TSH 2.69 06/24/2021   TSH 2.61 11/08/2018           Assessment & Plan:   1) Uncontrolled Type 2 Diabetes without complications  He presents today, accompanied by his wife, with his meter and logs showing greatly improved glycemic profile.  He is continuing to reduce his dose of Prednisone per his Rheumatologist recommendations.  Analysis of his meter shows 7-day average of 84, 14-day average of 74, 30-day average of 87.  He did have low of 21 but did not have any symptoms of such.  - Grant Cardenas has currently uncontrolled symptomatic type 2 DM since 66 years of age.   -Recent labs reviewed.  - I had a long discussion with him about the progressive nature of diabetes and the pathology behind its complications. -his diabetes is complicated by CAD (history of MI) and he remains at a high risk for more acute and chronic complications which include CVA, CKD, retinopathy, and neuropathy. These are all discussed in detail with him.  - Nutritional counseling repeated at each appointment due to patients tendency to fall back in to old habits.  - The patient admits there is a room for improvement in their diet and drink choices. -  Suggestion is made for the patient to avoid simple carbohydrates from their diet including Cakes, Sweet Desserts / Pastries, Ice Cream, Soda (diet and regular), Sweet Tea, Candies, Chips, Cookies, Sweet Pastries, Store Bought  Juices, Alcohol in Excess of 1-2 drinks a day, Artificial Sweeteners, Coffee Creamer, and "Sugar-free" Products. This will help patient to have stable blood glucose profile and potentially avoid unintended weight gain.   - I encouraged the patient to switch to unprocessed or minimally processed complex starch and increased protein intake (animal or plant source), fruits, and vegetables.   - Patient is advised to stick to a routine mealtimes to eat 3 meals a day and avoid unnecessary snacks (to snack only to correct hypoglycemia).  - I have approached him with the following individualized plan to manage  his diabetes and patient agrees:   -Due to his recent oral steroid use, he has fallen off track with his glucose.    -He is advised to continue Metformin 500 mg po twice daily with meals and Jardiance 10 mg po daily.  He can stop his Glipizide for now given his significant hypoglycemia.    He is advised to continue monitoring blood glucose once daily, before breakfast and to call the clinic if he has readings less than 70 or above 200 for 3 tests in a row.   - he is not a good candidate for incretin therapy due to body habitus.  - Specific targets for  A1c;  LDL, HDL,  and Triglycerides were discussed with the patient.  2) Blood Pressure /Hypertension:  his blood pressure is controlled to target.  he is advised to continue his current medications including Carvedilol 3.125 mg po twice daily and Losartan 50 mg p.o. daily with breakfast.  3) Lipids/Hyperlipidemia:    Review of his recent lipid panel from 12/23/21 showed controlled  LDL at 40 .  he  is advised to continue Lipitor 40 mg po daily at bedtime and Zetia 10 mg daily at bedtime.  Side effects and precautions discussed with him.  He does have elevated LFTs, which started soon after treatment for his psoriasis, he does follow with GI.  He does NOT drink alcohol.  4)  Weight/Diet:  his Body mass index is 22.48 kg/m.  -  he is not a  candidate for weight loss. Exercise, and detailed carbohydrates information provided  -  detailed on discharge instructions.  5) Chronic Care/Health Maintenance: -he is on ACEI/ARB and Statin medications and is encouraged to initiate and continue to follow up with Ophthalmology, Dentist, Podiatrist at least yearly or according to recommendations, and advised to stay away from smoking. I have recommended yearly flu vaccine and pneumonia vaccine at least every 5 years; moderate intensity exercise for up to 150 minutes weekly; and sleep for at least 7 hours a day.  - he is advised to maintain close follow up with Lindell Spar, MD for primary care needs, as well as his other providers for optimal and coordinated care.      I spent 40 minutes in the care of the patient today including review of labs from [facility], Lipids, Thyroid Function, Hematology (current and previous including abstractions from other facilities); face-to-face time discussing  his blood glucose readings/logs, discussing hypoglycemia and hyperglycemia episodes and symptoms, medications doses, his options of short and long term treatment based on the latest standards of care / guidelines;  discussion about incorporating lifestyle medicine;  and documenting the encounter. Risk reduction counseling performed per USPSTF guidelines to reduce obesity and cardiovascular risk factors.     Please refer to Patient Instructions for Blood Glucose Monitoring and Insulin/Medications Dosing Guide"  in media tab for additional information. Please  also refer to " Patient Self Inventory" in the Media  tab for reviewed elements of pertinent patient history.  Grant Cardenas participated in the discussions, expressed understanding, and voiced agreement with the above plans.  All questions were answered to his satisfaction. he is encouraged to contact clinic should he have any questions or concerns prior to his return visit.   Follow up plan: - Return  in about 3 months (around 11/29/2022) for Diabetes F/U with A1c in office, No previsit labs, Bring meter and logs.  Rayetta Pigg, [facility] [address] [place], [place] [address] Phone: [phone] Fax: [phone]  08/29/2022, 12:34 PM

## 2022-10-20 ENCOUNTER — Other Ambulatory Visit: Payer: Self-pay | Admitting: Nurse Practitioner

## 2022-11-30 ENCOUNTER — Ambulatory Visit (INDEPENDENT_AMBULATORY_CARE_PROVIDER_SITE_OTHER): Payer: No Typology Code available for payment source | Admitting: Nurse Practitioner

## 2022-11-30 ENCOUNTER — Encounter: Payer: Self-pay | Admitting: Nurse Practitioner

## 2022-11-30 VITALS — BP 124/74 | HR 68 | Ht 73.0 in | Wt 182.0 lb

## 2022-11-30 DIAGNOSIS — E119 Type 2 diabetes mellitus without complications: Secondary | ICD-10-CM | POA: Diagnosis not present

## 2022-11-30 LAB — POCT GLYCOSYLATED HEMOGLOBIN (HGB A1C): Hemoglobin A1C: 6.3 % — AB (ref 4.0–5.6)

## 2022-11-30 LAB — POCT UA - MICROALBUMIN
Creatinine, POC: 50 mg/dL
Microalbumin Ur, POC: 10 mg/L

## 2022-11-30 NOTE — Progress Notes (Signed)
Endocrinology Follow Up Note       11/30/2022, 11:43 AM   Subjective:    Patient ID: Grant Cardenas, male    DOB: 11/05/1955.  Grant Cardenas is being seen in follow up after being seen in consultation for management of currently uncontrolled symptomatic diabetes requested by  Lindell Spar, MD.   Past Medical History:  Diagnosis Date   CAD (coronary artery disease)    a. anterior ST elevation MI in 09/2015. Cardiac cath showed occluded prox LAD and 60% ostial RCA stenosis. There was also very distal disease affecting the LAD and ramus. He underwent successful angioplasty and drug-eluting stent placement to the LAD. Ejection fraction was 35-40% which improved subsequently an echo to 55-60%.   Chronic obstructive pulmonary disease, unspecified (HCC)    COPD (chronic obstructive pulmonary disease) ([place]) 03/2016   4th stage-Dr. Luan Pulling   Essential (primary) hypertension    Hyperlipidemia    Ischemic cardiomyopathy    a. EF by cath 09/13/2015: 35-40%; b. 09/14/2015: EF 55-60%, Probable akinesis of  the midanteroseptal myocardium. akinesis of the apicalanterior myocardium, GR1DD   MI (myocardial infarction) ([place])    Psoriasis    Psoriasis, unspecified    ST elevation (STEMI) myocardial infarction involving left anterior descending coronary artery ([place])  09/2015 09/13/2015   Tobacco use    Vitreous floaters of both eyes     Past Surgical History:  Procedure Laterality Date   CARDIAC CATHETERIZATION N/A 09/13/2015   Procedure: Left Heart Cath and Coronary Angiography;  Surgeon: Wellington Hampshire, MD;  Location: [facility];  Service: Cardiovascular;  Laterality: N/A;   CARDIAC CATHETERIZATION N/A 09/13/2015   Procedure: Coronary Stent Intervention;  Surgeon: Wellington Hampshire, MD;  Location: [facility];  Service: Cardiovascular;  Laterality: N/A;   CARDIAC CATHETERIZATION N/A 11/03/2015    Procedure: Left Heart Cath and Coronary Angiography;  Surgeon: Peter M Martinique, MD;  Location: [facility];  Service: Cardiovascular;  Laterality: N/A;   COLONOSCOPY N/A 01/29/2016   Procedure: COLONOSCOPY;  Surgeon: Danie Binder, MD;  [facility];  Service: Endoscopy;  Laterality: N/A;  1030-moved to 1215 office to notify    COLONOSCOPY N/A 02/28/2020   Procedure: COLONOSCOPY;  Surgeon: Daneil Dolin, MD;  [facility];  Service: Endoscopy;  Laterality: N/A;  9:30   ESOPHAGOGASTRODUODENOSCOPY N/A 01/29/2016   Procedure: ESOPHAGOGASTRODUODENOSCOPY (EGD);  Surgeon: Danie Binder, MD;  [facility];  Service: Endoscopy;  Laterality: N/A;   POLYPECTOMY  02/28/2020   Procedure: POLYPECTOMY;  Surgeon: Daneil Dolin, MD;  [facility];  Service: Endoscopy;;    Social History   Socioeconomic History   Marital status: Married    Spouse name: Not on file   Number of children: Not on file   Years of education: Not on file   Highest education level: Not on file  Occupational History   Not on file  Tobacco Use   Smoking status: Former    Years: 40.00    Types: Cigarettes    Quit date: 09/14/2015    Years since quitting: 7.2   Smokeless tobacco: Never  Vaping  Use   Vaping Use: Former  Substance and Sexual Activity   Alcohol use: No    Alcohol/week: 0.0 standard drinks of alcohol    Comment: Previously heavy beer drinker; quit 20 years ago   Drug use: No   Sexual activity: Never  Other Topics Concern   Not on file  Social History Narrative   Not on file   Social Determinants of Health   Financial Resource Strain: Low Risk  (07/05/2022)   Overall Financial Resource Strain (CARDIA)    Difficulty of Paying Living Expenses: Not hard at all  Food Insecurity: No Food Insecurity (07/05/2022)   Hunger Vital Sign    Worried About Running Out of Food in the Last Year: Never true    Ran Out of Food in the Last Year: Never true   Transportation Needs: No Transportation Needs (07/05/2022)   PRAPARE - Hydrologist (Medical): No    Lack of Transportation (Non-Medical): No  Physical Activity: Sufficiently Active (07/05/2022)   Exercise Vital Sign    Days of Exercise per Week: 5 days    Minutes of Exercise per Session: 30 min  Stress: No Stress Concern Present (05/03/2021)   [place]    Feeling of Stress : Not at all  Social Connections: Moderately Integrated (07/05/2022)   Social Connection and Isolation Panel [NHANES]    Frequency of Communication with Friends and Family: Once a week    Frequency of Social Gatherings with Friends and Family: Twice a week    Attends Religious Services: More than 4 times per year    Active Member of [employer] or Organizations: No    Attends Archivist Meetings: Never    Marital Status: Married    Family History  Problem Relation Age of Onset   Heart Problems Mother    Heart attack Father    Heart disease Father    Cancer Other    Colon cancer Maternal Aunt     Outpatient Encounter Medications as of 11/30/2022  Medication Sig   Albuterol Sulfate 108 (90 Base) MCG/ACT AEPB Inhale 1-2 puffs into the lungs 4 (four) times daily as needed (wheezing/shortness of breath.).    aspirin EC 81 MG tablet Take 1 tablet (81 mg total) by mouth daily.   atorvastatin (LIPITOR) 40 MG tablet TAKE ONE TABLET BY MOUTH AT BEDTIME FOR CHOLESTEROL   Calcium Carb-Cholecalciferol 600-10 MG-MCG TABS Take 1 tablet by mouth 2 (two) times daily.   calcium carbonate (OS-CAL) 1250 (500 Ca) MG chewable tablet CHEW 1-2 TABLETS BY MOUTH AT BEDTIME AS NEEDED FOR INDIGESTION   Carboxymethylcellulose Sod PF 0.25 % SOLN Place 1 drop into both eyes 4 (four) times daily as needed (dry/irritated eyes.).    carvedilol (COREG) 3.125 MG tablet Take 1 tablet (3.125 mg total) by mouth 2 (two) times daily with a meal.    Cholecalciferol 25 MCG (1000 UT) tablet Take 1 tablet by mouth daily.   clobetasol ointment (TEMOVATE) [id number] % Apply 1 application topically 2 (two) times daily as needed (psoriasis).    ezetimibe (ZETIA) 10 MG tablet Take 1 tablet by mouth daily.   fluticasone (CUTIVATE) 0.05 % cream Apply 1 application topically 2 (two) times daily as needed (psoriasis (face & groin areas)).    ketoconazole (NIZORAL) 2 % shampoo Apply 1 application topically 2 (two) times a week. Applied to facial hair (beard) & scalp. Leave on 10 minutes prior to washing out.  losartan (COZAAR) 50 MG tablet Take 50 mg by mouth 2 (two) times daily.   Menthol-Methyl Salicylate (THERA-GESIC) 0.5-15 % CREA APPLY MODERATE AMOUNT TO AFFECTED AREA TWICE A DAY   metFORMIN (GLUCOPHAGE) 500 MG tablet Take 1 tablet (500 mg total) by mouth 2 (two) times daily with a meal.   nitroGLYCERIN (NITROSTAT) 0.4 MG SL tablet Place 1 tablet (0.4 mg total) under the tongue every 5 (five) minutes as needed for chest pain.   pantoprazole (PROTONIX) 40 MG tablet Take 40 mg by mouth daily.   predniSONE (DELTASONE) 5 MG tablet TAKE THREE AND ONE-HALF TABLETS BY MOUTH DAILY FOR 21 DAYS, THEN TAKE THREE TABLETS DAILY FOR 21 DAYS, THEN TAKE TWO AND ONE-HALF TABLETS DAILY FOR 21 DAYS, THEN TAKE TWO TABLETS DAILY FOR 21 DAYS   umeclidinium-vilanterol (ANORO ELLIPTA) 62.5-25 MCG/INH AEPB Inhale 1 puff into the lungs daily.   Ustekinumab (STELARA [place]) Inject 1 Dose into the skin every 3 (three) months. Every 12 weeks   [DISCONTINUED] empagliflozin (JARDIANCE) 10 MG TABS tablet Take 1 tablet (10 mg total) by mouth daily.   No facility-administered encounter medications on file as of 11/30/2022.    ALLERGIES: Allergies  Allergen Reactions   Sulfa Antibiotics Other (See Comments)    Sulfa drugs (unaware of this reaction per patient spouse)      VACCINATION STATUS: Immunization History  Administered Date(s) Administered   Fluad Quad(high Dose 65+) 07/03/2020    Influenza Inj Mdck Quad Pf 08/11/2016   Influenza, High Dose Seasonal PF 09/05/2016   Influenza, Seasonal, Injecte, Preservative Fre 06/18/2019   Influenza,inj,Quad PF,6+ Mos 07/07/2017, 07/02/2018, 06/18/2019   Influenza-Unspecified 06/18/2019, 06/21/2022   Moderna Sars-Covid-2 Vaccination 11/07/2019, 12/05/2019, 08/04/2020, 01/12/2021   Pneumococcal Conjugate-13 06/07/2016   Pneumococcal Polysaccharide-23 07/07/2017   Td (Adult) 06/07/2016   Tdap 11/03/2004   Zoster Recombinat (Shingrix) 10/04/2019, 03/20/2020    Diabetes He presents for his follow-up diabetic visit. He has type 2 diabetes mellitus. Onset time: Diagnosed at approx age of 26. His disease course has been improving. There are no hypoglycemic associated symptoms. (He did not have any symptoms of hypoglycemia, despite glucose reading of 21. ) Pertinent negatives for diabetes include no blurred vision, no fatigue, no polydipsia, no polyuria and no weight loss. There are no hypoglycemic complications. Symptoms are improving. Diabetic complications include heart disease and nephropathy. Risk factors for coronary artery disease include diabetes mellitus, dyslipidemia, hypertension and male sex. Current diabetic treatment includes diet and oral agent (dual therapy). He is compliant with treatment most of the time. His weight is increasing steadily. He is following a generally healthy diet. When asked about meal planning, he reported none. He has not had a previous visit with a dietitian. He participates in exercise intermittently. His home blood glucose trend is decreasing steadily. His breakfast blood glucose range is generally 110-130 mg/dl. (He presents today, accompanied by his wife, with his meter and logs showing improved glycemic profile overall.  His POCT A1c today is 6.3%, improving from last visit of 11.1%.  He denies any hypoglycemia since discontinuing Glipizide last visit.  He has completely gotten off his Prednisone and does  complain of pain, but not bad enough where he takes medication on a daily basis.) An ACE inhibitor/angiotensin II receptor blocker is being taken. He does not see a podiatrist.Eye exam is current.  Hyperlipidemia This is a chronic problem. The current episode started more than 1 year ago. The problem is controlled. Recent lipid tests were reviewed and are normal. Exacerbating  diseases include diabetes. Factors aggravating his hyperlipidemia include beta blockers. Current antihyperlipidemic treatment includes statins. The current treatment provides significant improvement of lipids. There are no compliance problems.  Risk factors for coronary artery disease include diabetes mellitus, dyslipidemia, hypertension and male sex.  Hypertension This is a chronic problem. The current episode started more than 1 year ago. The problem has been resolved since onset. The problem is controlled. Pertinent negatives include no blurred vision. There are no associated agents to hypertension. Risk factors for coronary artery disease include diabetes mellitus, dyslipidemia and male gender. Past treatments include angiotensin blockers and beta blockers. The current treatment provides moderate improvement. There are no compliance problems.  Hypertensive end-organ damage includes CAD/MI.    Review of systems  Constitutional: + steadily increasing body weight,  current Body mass index is 24.01 kg/m. , + fatigue, no subjective hyperthermia, no subjective hypothermia Eyes: + blurry vision (recently diagnosed with cataracts), no xerophthalmia ENT: no sore throat, no nodules palpated in throat, no dysphagia/odynophagia, no hoarseness Cardiovascular: no chest pain, no shortness of breath, no palpitations, no leg swelling Respiratory: no cough, no shortness of breath Gastrointestinal: no nausea/vomiting/diarrhea Musculoskeletal: no muscle/joint aches Skin: no rashes, no hyperemia Neurological: no tremors, no numbness, no  tingling, no dizziness Psychiatric: no depression, no anxiety  Objective:     BP 124/74 (BP Location: Left Arm, Patient Position: Sitting, Cuff Size: Normal)   Pulse 68   Ht '6\' 1"'$  (1.854 m)   Wt 182 lb (82.6 kg)   BMI 24.01 kg/m   Wt Readings from Last 3 Encounters:  11/30/22 182 lb (82.6 kg)  08/29/22 170 lb 6.4 oz (77.3 kg)  07/22/22 166 lb 6.4 oz (75.5 kg)     BP Readings from Last 3 Encounters:  11/30/22 124/74  08/29/22 (!) 95/58  07/22/22 97/60     Physical Exam- Limited  Constitutional:  Body mass index is 24.01 kg/m. , not in acute distress, anxious state of mind Eyes:  EOMI, no exophthalmos Musculoskeletal: no gross deformities, strength intact in all four extremities, no gross restriction of joint movements Skin:  no rashes, no hyperemia Neurological: no tremor with outstretched hands   Diabetic Foot Exam - Simple   No data filed     CMP ( most recent) CMP     Component Value Date/Time   NA 138 08/22/2022 1156   K 4.8 08/22/2022 1156   CL 103 08/22/2022 1156   CO2 21 08/22/2022 1156   GLUCOSE 142 (H) 08/22/2022 1156   GLUCOSE 448 (H) 11/28/2020 1328   BUN 23 08/22/2022 1156   CREATININE 1.27 08/22/2022 1156   CALCIUM 10.3 (H) 08/22/2022 1156   PROT 6.9 08/22/2022 1156   ALBUMIN 4.6 08/22/2022 1156   AST 26 08/22/2022 1156   ALT 33 08/22/2022 1156   ALKPHOS 50 08/22/2022 1156   BILITOT 1.3 (H) 08/22/2022 1156   GFRNONAA >60 11/28/2020 1328   GFRAA 81 06/24/2021 0000     Diabetic Labs (most recent): Lab Results  Component Value Date   HGBA1C 6.3 (A) 11/30/2022   HGBA1C 11.1 (A) 06/28/2022   HGBA1C 6.1 12/23/2021   MICROALBUR 10 mg/L 11/30/2022   MICROALBUR 30 12/23/2021   MICROALBUR 10 08/16/2021     Lipid Panel ( most recent) Lipid Panel     Component Value Date/Time   CHOL 86 12/03/2020 0000   CHOL 212 (H) 09/23/2015 1540   TRIG 48 12/23/2021 0000   HDL 43 11/08/2018 0000   HDL 41 09/23/2015 1540  CHOLHDL 3.7 11/26/2015  0815   VLDL 17 11/26/2015 0815   LDLCALC 40 12/23/2021 0000   LDLCALC 137 (H) 09/23/2015 1540   LABVLDL 34 09/23/2015 1540      Lab Results  Component Value Date   TSH 2.60 12/23/2021   TSH 2.69 06/24/2021   TSH 2.61 11/08/2018           Assessment & Plan:   1) Controlled Type 2 Diabetes without complications  He presents today, accompanied by his wife, with his meter and logs showing improved glycemic profile overall.  His POCT A1c today is 6.3%, improving from last visit of 11.1%.  He denies any hypoglycemia since discontinuing Glipizide last visit.  He has completely gotten off his Prednisone and does complain of pain, but not bad enough where he takes medication on a daily basis.  - Grant Cardenas has currently uncontrolled symptomatic type 2 DM since 68 years of age.   -Recent labs reviewed.  - I had a long discussion with him about the progressive nature of diabetes and the pathology behind its complications. -his diabetes is complicated by CAD (history of MI) and he remains at a high risk for more acute and chronic complications which include CVA, CKD, retinopathy, and neuropathy. These are all discussed in detail with him.  - Nutritional counseling repeated at each appointment due to patients tendency to fall back in to old habits.  - The patient admits there is a room for improvement in their diet and drink choices. -  Suggestion is made for the patient to avoid simple carbohydrates from their diet including Cakes, Sweet Desserts / Pastries, Ice Cream, Soda (diet and regular), Sweet Tea, Candies, Chips, Cookies, Sweet Pastries, Store Bought Juices, Alcohol in Excess of 1-2 drinks a day, Artificial Sweeteners, Coffee Creamer, and "Sugar-free" Products. This will help patient to have stable blood glucose profile and potentially avoid unintended weight gain.   - I encouraged the patient to switch to unprocessed or minimally processed complex starch and increased protein  intake (animal or plant source), fruits, and vegetables.   - Patient is advised to stick to a routine mealtimes to eat 3 meals a day and avoid unnecessary snacks (to snack only to correct hypoglycemia).  - I have approached him with the following individualized plan to manage  his diabetes and patient agrees:   -He is advised to continue Metformin 500 mg po twice daily with meals and stop the Jardiance today.  May look to discontinue Metformin on subsequent visits.  He is advised to continue monitoring blood glucose once daily maybe 1-2 times per week, before breakfast and to call the clinic if he has readings less than 70 or above 200 for 3 tests in a row.   - he is not a good candidate for incretin therapy due to body habitus.  - Specific targets for  A1c;  LDL, HDL,  and Triglycerides were discussed with the patient.  2) Blood Pressure /Hypertension:  his blood pressure is controlled to target.   he is advised to continue his current medications including Carvedilol 3.125 mg po twice daily and Losartan 50 mg p.o. daily with breakfast.  3) Lipids/Hyperlipidemia:    Review of his recent lipid panel from 12/23/21 showed controlled  LDL at 40 .  he  is advised to continue Lipitor 40 mg po daily at bedtime and Zetia 10 mg daily at bedtime.  Side effects and precautions discussed with him.  He does have elevated LFTs, which  started soon after treatment for his psoriasis, he does follow with GI.  He does NOT drink alcohol.  4)  Weight/Diet:  his Body mass index is 24.01 kg/m.  -  he is not a candidate for weight loss. Exercise, and detailed carbohydrates information provided  -  detailed on discharge instructions.  5) Chronic Care/Health Maintenance: -he is on ACEI/ARB and Statin medications and is encouraged to initiate and continue to follow up with Ophthalmology, Dentist, Podiatrist at least yearly or according to recommendations, and advised to stay away from smoking. I have recommended  yearly flu vaccine and pneumonia vaccine at least every 5 years; moderate intensity exercise for up to 150 minutes weekly; and sleep for at least 7 hours a day.  - he is advised to maintain close follow up with Lindell Spar, MD for primary care needs, as well as his other providers for optimal and coordinated care.     I spent  20  minutes in the care of the patient today including review of labs from [place], Lipids, Thyroid Function, Hematology (current and previous including abstractions from other facilities); face-to-face time discussing  his blood glucose readings/logs, discussing hypoglycemia and hyperglycemia episodes and symptoms, medications doses, his options of short and long term treatment based on the latest standards of care / guidelines;  discussion about incorporating lifestyle medicine;  and documenting the encounter. Risk reduction counseling performed per USPSTF guidelines to reduce obesity and cardiovascular risk factors.     Please refer to Patient Instructions for Blood Glucose Monitoring and Insulin/Medications Dosing Guide"  in media tab for additional information. Please  also refer to " Patient Self Inventory" in the Media  tab for reviewed elements of pertinent patient history.  Grant Cardenas participated in the discussions, expressed understanding, and voiced agreement with the above plans.  All questions were answered to his satisfaction. he is encouraged to contact clinic should he have any questions or concerns prior to his return visit.   Follow up plan: - Return in about 3 months (around 02/28/2023) for Diabetes F/U with A1c in office, No previsit labs.  Rayetta Pigg, [facility] [address] [place], [place] [address] Phone: [phone] Fax: [phone]  11/30/2022, 11:43 AM

## 2023-01-06 ENCOUNTER — Encounter: Payer: Self-pay | Admitting: Internal Medicine

## 2023-01-06 ENCOUNTER — Ambulatory Visit (INDEPENDENT_AMBULATORY_CARE_PROVIDER_SITE_OTHER): Payer: Medicare HMO | Admitting: Internal Medicine

## 2023-01-06 VITALS — BP 122/65 | HR 58 | Ht 73.0 in | Wt 179.4 lb

## 2023-01-06 DIAGNOSIS — I25119 Atherosclerotic heart disease of native coronary artery with unspecified angina pectoris: Secondary | ICD-10-CM | POA: Diagnosis not present

## 2023-01-06 DIAGNOSIS — L409 Psoriasis, unspecified: Secondary | ICD-10-CM

## 2023-01-06 DIAGNOSIS — M353 Polymyalgia rheumatica: Secondary | ICD-10-CM | POA: Diagnosis not present

## 2023-01-06 DIAGNOSIS — E1169 Type 2 diabetes mellitus with other specified complication: Secondary | ICD-10-CM | POA: Insufficient documentation

## 2023-01-06 DIAGNOSIS — I10 Essential (primary) hypertension: Secondary | ICD-10-CM

## 2023-01-06 DIAGNOSIS — E119 Type 2 diabetes mellitus without complications: Secondary | ICD-10-CM | POA: Insufficient documentation

## 2023-01-06 DIAGNOSIS — E1159 Type 2 diabetes mellitus with other circulatory complications: Secondary | ICD-10-CM | POA: Diagnosis not present

## 2023-01-06 DIAGNOSIS — J449 Chronic obstructive pulmonary disease, unspecified: Secondary | ICD-10-CM | POA: Diagnosis not present

## 2023-01-06 DIAGNOSIS — Z0001 Encounter for general adult medical examination with abnormal findings: Secondary | ICD-10-CM | POA: Insufficient documentation

## 2023-01-06 DIAGNOSIS — I255 Ischemic cardiomyopathy: Secondary | ICD-10-CM | POA: Diagnosis not present

## 2023-01-06 DIAGNOSIS — E782 Mixed hyperlipidemia: Secondary | ICD-10-CM | POA: Diagnosis not present

## 2023-01-06 DIAGNOSIS — I7143 Infrarenal abdominal aortic aneurysm, without rupture: Secondary | ICD-10-CM | POA: Diagnosis not present

## 2023-01-06 MED ORDER — IBUPROFEN 600 MG PO TABS: 600.0000 mg | ORAL_TABLET | Freq: Three times a day (TID) | ORAL | 1 refills | Status: AC | PRN

## 2023-01-06 NOTE — Assessment & Plan Note (Addendum)
Well-controlled currently with Anoro Ellipta and PRN Albuterol Quit smoking in 2016

## 2023-01-06 NOTE — Progress Notes (Signed)
Established Patient Office Visit  Subjective:  Patient ID: Grant Cardenas, male    DOB: 19-Apr-1956  Age: 67 y.o. MRN: [id number]  CC:  Chief Complaint  Patient presents with   COPD    Follow up. Patient recently diagnosed with polymyalgia.    Annual Exam    HPI Grant Cardenas is a 67 y.o. male with past medical history of CAD s/p stent placement in 2016, HTN, COPD, type 2 DM, psoriasis, polymyalgia rheumatica, and HLD who presents for annual physical.  He was recently diagnosed with polymyalgia rheumatica and was placed on oral prednisone.  His blood glucose was running very high, and his HbA1c had been up to 11.1 in 09/23 due to oral steroid.  He had to stop taking prednisone due to it and is currently taking Tylenol and ibuprofen on an alternating basis.  He has acute flareup of shoulder, hip and and MCP joints of hands at times.  He follows up with Cardiologist at [facility] for his h/o CAD. Denies any chest pain or dyspnea currently.  BP is well-controlled. Takes medications regularly. Patient denies headache, dizziness, chest pain, dyspnea or palpitations.   He still takes metformin 500 mg BID for type II DM.  His last HbA1c was 6.9 in 03/24.  Denies any fatigue, polyuria or polydipsia currently.  He uses Anoro inhaler regularly and COPD is well-controlled. Quit smoking in 2016.  He has had episodes of COPD exacerbation recently and requests local pulmonology referral.   He is on Stelara for Psoriasis. He follows up with Dermatology.  Past Medical History:  Diagnosis Date   CAD (coronary artery disease)    a. anterior ST elevation MI in 09/2015. Cardiac cath showed occluded prox LAD and 60% ostial RCA stenosis. There was also very distal disease affecting the LAD and ramus. He underwent successful angioplasty and drug-eluting stent placement to the LAD. Ejection fraction was 35-40% which improved subsequently an echo to 55-60%.   Chronic obstructive pulmonary disease,  unspecified    COPD (chronic obstructive pulmonary disease) 03/2016   4th stage-Dr. Juanetta Gosling   Essential (primary) hypertension    Hyperlipidemia    Ischemic cardiomyopathy    a. EF by cath 09/13/2015: 35-40%; b. 09/14/2015: EF 55-60%, Probable akinesis of  the midanteroseptal myocardium. akinesis of the apicalanterior myocardium, [id number]   MI (myocardial infarction)    Psoriasis    Psoriasis, unspecified    ST elevation (STEMI) myocardial infarction involving left anterior descending coronary artery (HCC)  09/2015 09/13/2015   Tobacco use    Vitreous floaters of both eyes     Past Surgical History:  Procedure Laterality Date   CARDIAC CATHETERIZATION N/A 09/13/2015   Procedure: Left Heart Cath and Coronary Angiography;  Surgeon: Iran Ouch, MD;  Location: [facility] INVASIVE CV LAB;  Service: Cardiovascular;  Laterality: N/A;   CARDIAC CATHETERIZATION N/A 09/13/2015   Procedure: Coronary Stent Intervention;  Surgeon: Iran Ouch, MD;  Location: [facility] INVASIVE CV LAB;  Service: Cardiovascular;  Laterality: N/A;   CARDIAC CATHETERIZATION N/A 11/03/2015   Procedure: Left Heart Cath and Coronary Angiography;  Surgeon: Peter M Swaziland, MD;  Location: [facility] INVASIVE CV LAB;  Service: Cardiovascular;  Laterality: N/A;   COLONOSCOPY N/A 01/29/2016   Procedure: COLONOSCOPY;  Surgeon: West Bali, MD;  Location: [facility] ENDO SUITE;  Service: Endoscopy;  Laterality: N/A;  1030-moved to 1215 office to notify    COLONOSCOPY N/A 02/28/2020   Procedure: COLONOSCOPY;  Surgeon: Corbin Ade, MD;  Location: [facility]  ENDO SUITE;  Service: Endoscopy;  Laterality: N/A;  9:30   ESOPHAGOGASTRODUODENOSCOPY N/A 01/29/2016   Procedure: ESOPHAGOGASTRODUODENOSCOPY (EGD);  Surgeon: West Bali, MD;  Location: [facility] ENDO SUITE;  Service: Endoscopy;  Laterality: N/A;   POLYPECTOMY  02/28/2020   Procedure: POLYPECTOMY;  Surgeon: Corbin Ade, MD;  Location: [facility] ENDO SUITE;  Service: Endoscopy;;    Family History  Problem  Relation Age of Onset   Heart Problems Mother    Heart attack Father    Heart disease Father    Cancer Other    Colon cancer Maternal Aunt     Social History   Socioeconomic History   Marital status: Married    Spouse name: Not on file   Number of children: Not on file   Years of education: Not on file   Highest education level: Not on file  Occupational History   Not on file  Tobacco Use   Smoking status: Former    Years: 40    Types: Cigarettes    Quit date: 09/14/2015    Years since quitting: 7.3   Smokeless tobacco: Never  Vaping Use   Vaping Use: Former  Substance and Sexual Activity   Alcohol use: No    Alcohol/week: 0.0 standard drinks of alcohol    Comment: Previously heavy beer drinker; quit 20 years ago   Drug use: No   Sexual activity: Never  Other Topics Concern   Not on file  Social History Narrative   Not on file   Social Determinants of Health   Financial Resource Strain: Low Risk  (07/05/2022)   Overall Financial Resource Strain (CARDIA)    Difficulty of Paying Living Expenses: Not hard at all  Food Insecurity: No Food Insecurity (07/05/2022)   Hunger Vital Sign    Worried About Running Out of Food in the Last Year: Never true    Ran Out of Food in the Last Year: Never true  Transportation Needs: No Transportation Needs (07/05/2022)   PRAPARE - Administrator, Civil Service (Medical): No    Lack of Transportation (Non-Medical): No  Physical Activity: Sufficiently Active (07/05/2022)   Exercise Vital Sign    Days of Exercise per Week: 5 days    Minutes of Exercise per Session: 30 min  Stress: No Stress Concern Present (05/03/2021)   [employer] of Occupational Health - Occupational Stress Questionnaire    Feeling of Stress : Not at all  Social Connections: Moderately Integrated (07/05/2022)   Social Connection and Isolation Panel [NHANES]    Frequency of Communication with Friends and Family: Once a week    Frequency of Social  Gatherings with Friends and Family: Twice a week    Attends Religious Services: More than 4 times per year    Active Member of [employer] or Organizations: No    Attends Banker Meetings: Never    Marital Status: Married  Catering manager Violence: Not At Risk (05/03/2021)   Humiliation, Afraid, Rape, and Kick questionnaire    Fear of Current or Ex-Partner: No    Emotionally Abused: No    Physically Abused: No    Sexually Abused: No    Outpatient Medications Prior to Visit  Medication Sig Dispense Refill   Albuterol Sulfate 108 (90 Base) MCG/ACT AEPB Inhale 1-2 puffs into the lungs 4 (four) times daily as needed (wheezing/shortness of breath.).      aspirin EC 81 MG tablet Take 1 tablet (81 mg total) by mouth daily. 90 tablet  3   atorvastatin (LIPITOR) 40 MG tablet TAKE ONE TABLET BY MOUTH AT BEDTIME FOR CHOLESTEROL     Calcium Carb-Cholecalciferol 600-10 MG-MCG TABS Take 1 tablet by mouth 2 (two) times daily.     calcium carbonate (OS-CAL) 1250 (500 Ca) MG chewable tablet CHEW 1-2 TABLETS BY MOUTH AT BEDTIME AS NEEDED FOR INDIGESTION     Carboxymethylcellulose Sod PF 0.25 % SOLN Place 1 drop into both eyes 4 (four) times daily as needed (dry/irritated eyes.).      carvedilol (COREG) 3.125 MG tablet Take 1 tablet (3.125 mg total) by mouth 2 (two) times daily with a meal. 180 tablet 3   Cholecalciferol 25 MCG (1000 UT) tablet Take 1 tablet by mouth daily.     clobetasol ointment (TEMOVATE) 0.05 % Apply 1 application topically 2 (two) times daily as needed (psoriasis).   1   ezetimibe (ZETIA) 10 MG tablet Take 1 tablet by mouth daily.     fluticasone (CUTIVATE) 0.05 % cream Apply 1 application topically 2 (two) times daily as needed (psoriasis (face & groin areas)).      ketoconazole (NIZORAL) 2 % shampoo Apply 1 application topically 2 (two) times a week. Applied to facial hair (beard) & scalp. Leave on 10 minutes prior to washing out.     losartan (COZAAR) 50 MG tablet Take 50 mg by  mouth 2 (two) times daily.     Menthol-Methyl Salicylate (THERA-GESIC) 0.5-15 % CREA APPLY MODERATE AMOUNT TO AFFECTED AREA TWICE A DAY     metFORMIN (GLUCOPHAGE) 500 MG tablet Take 1 tablet (500 mg total) by mouth 2 (two) times daily with a meal. 180 tablet 3   nitroGLYCERIN (NITROSTAT) 0.4 MG SL tablet Place 1 tablet (0.4 mg total) under the tongue every 5 (five) minutes as needed for chest pain. 25 tablet 12   pantoprazole (PROTONIX) 40 MG tablet Take 40 mg by mouth daily.     umeclidinium-vilanterol (ANORO ELLIPTA) 62.5-25 MCG/INH AEPB Inhale 1 puff into the lungs daily.     Ustekinumab (STELARA [employer]) Inject 1 Dose into the skin every 3 (three) months. Every 12 weeks     predniSONE (DELTASONE) 5 MG tablet TAKE THREE AND ONE-HALF TABLETS BY MOUTH DAILY FOR 21 DAYS, THEN TAKE THREE TABLETS DAILY FOR 21 DAYS, THEN TAKE TWO AND ONE-HALF TABLETS DAILY FOR 21 DAYS, THEN TAKE TWO TABLETS DAILY FOR 21 DAYS     No facility-administered medications prior to visit.    Allergies  Allergen Reactions   Sulfa Antibiotics Other (See Comments)    Sulfa drugs (unaware of this reaction per patient spouse)      ROS Review of Systems  Constitutional:  Negative for chills and fever.  HENT:  Negative for congestion and sore throat.   Eyes:  Negative for pain and discharge.  Respiratory:  Negative for cough and shortness of breath.   Cardiovascular:  Negative for chest pain and palpitations.  Gastrointestinal:  Negative for constipation, diarrhea, nausea and vomiting.  Endocrine: Negative for polydipsia and polyuria.  Genitourinary:  Negative for dysuria and hematuria.  Musculoskeletal:  Negative for neck pain and neck stiffness.  Skin:  Negative for rash.  Neurological:  Negative for dizziness, weakness, numbness and headaches.  Psychiatric/Behavioral:  Negative for agitation and behavioral problems.       Objective:    Physical Exam Vitals reviewed.  Constitutional:      General: He is not in  acute distress.    Appearance: He is not diaphoretic.  HENT:  Head: Normocephalic and atraumatic.     Nose: Nose normal.     Mouth/Throat:     Mouth: Mucous membranes are moist.  Eyes:     General: No scleral icterus.    Extraocular Movements: Extraocular movements intact.  Cardiovascular:     Rate and Rhythm: Normal rate and regular rhythm.     Pulses: Normal pulses.     Heart sounds: Normal heart sounds. No murmur heard. Pulmonary:     Breath sounds: Normal breath sounds. No wheezing or rales.  Abdominal:     Palpations: Abdomen is soft.     Tenderness: There is no abdominal tenderness.  Musculoskeletal:     Cervical back: Neck supple. No tenderness.     Right lower leg: No edema.     Left lower leg: No edema.  Skin:    General: Skin is warm.     Findings: No rash.  Neurological:     General: No focal deficit present.     Mental Status: He is alert and oriented to person, place, and time.     Cranial Nerves: No cranial nerve deficit.     Sensory: No sensory deficit.     Motor: No weakness.  Psychiatric:        Mood and Affect: Mood normal.        Behavior: Behavior normal.     BP 122/65 (BP Location: Left Arm, Patient Position: Sitting, Cuff Size: Normal)   Pulse (!) 58   Ht 6\' 1"  (1.854 m)   Wt 179 lb 6.4 oz (81.4 kg)   SpO2 96%   BMI 23.67 kg/m  Wt Readings from Last 3 Encounters:  01/06/23 179 lb 6.4 oz (81.4 kg)  11/30/22 182 lb (82.6 kg)  08/29/22 170 lb 6.4 oz (77.3 kg)    Lab Results  Component Value Date   TSH 2.60 12/23/2021   Lab Results  Component Value Date   WBC 5.5 11/28/2020   HGB 15.0 12/03/2020   HCT 43 12/03/2020   MCV 86.1 11/28/2020   PLT 235 11/28/2020   Lab Results  Component Value Date   NA 138 08/22/2022   K 4.8 08/22/2022   CO2 21 08/22/2022   GLUCOSE 142 (H) 08/22/2022   BUN 23 08/22/2022   CREATININE 1.27 08/22/2022   BILITOT 1.3 (H) 08/22/2022   ALKPHOS 50 08/22/2022   AST 26 08/22/2022   ALT 33 08/22/2022    PROT 6.9 08/22/2022   ALBUMIN 4.6 08/22/2022   CALCIUM 10.3 (H) 08/22/2022   ANIONGAP 12 11/28/2020   EGFR 62 08/22/2022   Lab Results  Component Value Date   CHOL 86 12/03/2020   Lab Results  Component Value Date   HDL 43 11/08/2018   Lab Results  Component Value Date   LDLCALC 40 12/23/2021   Lab Results  Component Value Date   TRIG 48 12/23/2021   Lab Results  Component Value Date   CHOLHDL 3.7 11/26/2015   Lab Results  Component Value Date   HGBA1C 6.3 (A) 11/30/2022      Assessment & Plan:   Problem List Items Addressed This Visit       Cardiovascular and Mediastinum   Coronary artery disease involving native coronary artery with angina pectoris    S/p stent placement for STEMI in 2016 On Aspirin, Coreg and statin Followed by Cardiology      Cardiomyopathy, ischemic    Had STEMI in 2016, s/p stent placement EF stable from last Echo in EMR F/u  with Cardiology On Coreg      Essential (primary) hypertension    BP Readings from Last 1 Encounters:  01/06/23 122/65  Well-controlled with Coreg and Losartan Counseled for compliance with the medications Advised DASH diet and moderate exercise/walking, at least 150 mins/week      Infrarenal abdominal aortic aneurysm (AAA) without rupture    US abdomen in 2023 showed 3.5 cm aortic aneurysm, he is a former smoker Check US abdomen after 2 years        Respiratory   Chronic obstructive lung disease    Well-controlled currently with Anoro Ellipta and PRN Albuterol Quit smoking in 2016        Endocrine   Diabetes mellitus    Lab Results  Component Value Date   HGBA1C 6.3 (A) 11/30/2022   Well controlled On metformin 500 mg twice daily Advised to follow diabetic diet On statin and ARB F/u CMP and lipid panel Diabetic eye exam: Advised to follow up with Ophthalmology for diabetic eye exam        Musculoskeletal and Integument   Psoriasis    On Stelara, follows up with Dermatology         Other   Hyperlipidemia    On statin and Zetia      Encounter for general adult medical examination with abnormal findings - Primary    Physical exam as documented. F[place] blood [place] reviewed from [facility]. Pref[facility].      Polymyalgia rheumatica    [facility] steroids, but had uncontrolled DM with it Ibuprofen as needed for severe joint pain Can take Tylenol on an alternating basis      Relevant Medications   ibuprofen (ADVIL) 600 MG tablet    Meds ordered this encounter  Medications   ibuprofen (ADVIL) 600 MG tablet    Sig: Take 1 tablet (600 mg total) by mouth every 8 (eight) hours as needed.    Dispense:  30 tablet    Refill:  1    Follow-up: Return in about 6 months (around 07/08/2023).    Anabel Halonutwik K Ajah Vanhoose, MD

## 2023-01-06 NOTE — Assessment & Plan Note (Signed)
On Stelara, follows up with Dermatology 

## 2023-01-06 NOTE — Assessment & Plan Note (Signed)
Has seen Rheumatologist at [facility] Was given oral steroids, but had uncontrolled DM with it Ibuprofen as needed for severe joint pain Can take Tylenol on an alternating basis

## 2023-01-06 NOTE — Assessment & Plan Note (Signed)
On statin and Zetia. 

## 2023-01-06 NOTE — Assessment & Plan Note (Signed)
Had STEMI in 2016, s/p stent placement EF stable from last Echo in EMR F/u with Cardiology On Coreg 

## 2023-01-06 NOTE — Assessment & Plan Note (Addendum)
Lab Results  Component Value Date   HGBA1C 6.3 (A) 11/30/2022   Well controlled On metformin 500 mg twice daily Advised to follow diabetic diet On statin and ARB F/u CMP and lipid panel Diabetic eye exam: Advised to follow up with Ophthalmology for diabetic eye exam

## 2023-01-06 NOTE — Assessment & Plan Note (Signed)
S/p stent placement for STEMI in 2016 ?On Aspirin, Coreg and statin ?Followed by Cardiology ?

## 2023-01-06 NOTE — Patient Instructions (Addendum)
Please continue to take medications as prescribed.  Please continue to follow low carb diet and perform moderate exercise/walking at least 150 mins/week.  [facility] discuss [facility].

## 2023-01-06 NOTE — Assessment & Plan Note (Signed)
US abdomen in 2023 showed 3.5 cm aortic aneurysm, he is a former smoker Check US abdomen after 2 years

## 2023-01-06 NOTE — Assessment & Plan Note (Addendum)
Physical exam as documented. Fasting blood tests reviewed from [place]. Prefers to get PCV20 vaccine at [facility] clinic.

## 2023-01-06 NOTE — Assessment & Plan Note (Signed)
BP Readings from Last 1 Encounters:  01/06/23 122/65   Well-controlled with Coreg and Losartan Counseled for compliance with the medications Advised DASH diet and moderate exercise/walking, at least 150 mins/week

## 2023-03-09 ENCOUNTER — Ambulatory Visit: Payer: No Typology Code available for payment source | Admitting: Nurse Practitioner

## 2023-03-13 ENCOUNTER — Encounter: Payer: Self-pay | Admitting: Nurse Practitioner

## 2023-03-13 ENCOUNTER — Ambulatory Visit (INDEPENDENT_AMBULATORY_CARE_PROVIDER_SITE_OTHER): Payer: No Typology Code available for payment source | Admitting: Nurse Practitioner

## 2023-03-13 VITALS — BP 99/64 | HR 56 | Ht 73.0 in | Wt 172.4 lb

## 2023-03-13 DIAGNOSIS — E119 Type 2 diabetes mellitus without complications: Secondary | ICD-10-CM

## 2023-03-13 DIAGNOSIS — Z7984 Long term (current) use of oral hypoglycemic drugs: Secondary | ICD-10-CM

## 2023-03-13 LAB — POCT GLYCOSYLATED HEMOGLOBIN (HGB A1C): Hemoglobin A1C: 5.6 % (ref 4.0–5.6)

## 2023-03-13 MED ORDER — METFORMIN HCL 500 MG PO TABS: 500.0000 mg | ORAL_TABLET | Freq: Every day | ORAL | 3 refills | Status: DC

## 2023-03-13 NOTE — Progress Notes (Signed)
Endocrinology Follow Up Note       03/13/2023, 11:12 AM   Subjective:    Patient ID: Grant Cardenas, male    DOB: Mar 24, 1956.  Grant Cardenas is being seen in follow up after being seen in consultation for management of currently uncontrolled symptomatic diabetes requested by  Anabel Halon, MD.   Past Medical History:  Diagnosis Date   CAD (coronary artery disease)    a. anterior ST elevation MI in 09/2015. Cardiac cath showed occluded prox LAD and 60% ostial RCA stenosis. There was also very distal disease affecting the LAD and ramus. He underwent successful angioplasty and drug-eluting stent placement to the LAD. Ejection fraction was 35-40% which improved subsequently an echo to 55-60%.   Chronic obstructive pulmonary disease, unspecified (HCC)    COPD (chronic obstructive pulmonary disease) (HCC) 03/2016   4th stage-Dr. Juanetta Gosling   Essential (primary) hypertension    Hyperlipidemia    Ischemic cardiomyopathy    a. EF by cath 09/13/2015: 35-40%; b. 09/14/2015: EF 55-60%, Probable akinesis of  the midanteroseptal myocardium. akinesis of the apicalanterior myocardium, GR1DD   MI (myocardial infarction) (HCC)    Psoriasis    Psoriasis, unspecified    ST elevation (STEMI) myocardial infarction involving left anterior descending coronary artery (HCC)  09/2015 09/13/2015   Tobacco use    Vitreous floaters of both eyes     Past Surgical History:  Procedure Laterality Date   CARDIAC CATHETERIZATION N/A 09/13/2015   Procedure: Left Heart Cath and Coronary Angiography;  Surgeon: Iran Ouch, MD;  Location: [facility] INVASIVE CV LAB;  Service: Cardiovascular;  Laterality: N/A;   CARDIAC CATHETERIZATION N/A 09/13/2015   Procedure: Coronary Stent Intervention;  Surgeon: Iran Ouch, MD;  Location: [facility] INVASIVE CV LAB;  Service: Cardiovascular;  Laterality: N/A;   CARDIAC CATHETERIZATION N/A 11/03/2015    Procedure: Left Heart Cath and Coronary Angiography;  Surgeon: Peter M Swaz[place], MD;  Location: [facility] IN[facility] CV LAB;  Service: Cardiovascular;  Laterality: N/A;   COLONOSCOPY N/A 01/29/2016   Procedure: COLONOSCOPY;  Surgeon: West Bali, MD;  Location: [facility] ENDO SUITE;  Service: Endoscopy;  Laterality: N/A;  1030-moved to 1215 office to notify    COLONOSCOPY N/A 02/28/2020   Procedure: COLONOSCOPY;  Surgeon: Corbin Ade, MD;  Location: [facility];  Service: Endoscopy;  Laterality: N/A;  9:30   ESOPHAGOGASTRODUODENOSCOPY N/A 01/29/2016   Procedure: ESOPHAGOGASTRODUODENOSCOPY (EGD);  Surgeon: West Bali, MD;  Location: [facility];  Service: Endoscopy;  Laterality: N/A;   POLYPECTOMY  02/28/2020   Procedure: POLYPECTOMY;  Surgeon: Corbin Ade, MD;  Location: [facility];  Service: Endoscopy;;    Social History   Socioeconomic History   Marital status: Married    Spouse name: Not on file   Number of children: Not on file   Years of education: Not on file   Highest education level: Not on file  Occupational History   Not on file  Tobacco Use   Smoking status: Former    Years: 40    Types: Cigarettes    Quit date: 09/14/2015    Years since quitting: 7.4   Smokeless tobacco: Never  Vaping  Use   Vaping Use: Former  Substance and Sexual Activity   Alcohol use: No    Alcohol/week: 0.0 standard drinks of alcohol    Comment: Previously heavy beer drinker; quit 20 years ago   Drug use: No   Sexual activity: Never  Other Topics Concern   Not on file  Social History Narrative   Not on file   Social Determinants of Health   Financial Resource Strain: Low Risk  (07/05/2022)   Overall Financial Resource Strain (CARDIA)    Difficulty of Paying Living Expenses: Not hard at all  Food Insecurity: No Food Insecurity (07/05/2022)   Hunger Vital Sign    Worried About Running Out of Food in the Last Year: Never true    Ran Out of Food in the Last Year: Never true  Transportation  Needs: No Transportation Needs (07/05/2022)   PRAPARE - Administrator, Civil Service (Medical): No    Lack of Transportation (Non-Medical): No  Physical Activity: Sufficiently Active (07/05/2022)   Exercise Vital Sign    Days of Exercise per Week: 5 days    Minutes of Exercise per Session: 30 min  Stress: No Stress Concern Present (05/03/2021)   [employer] of Occupational Health - Occupational Stress Questionnaire    Feeling of Stress : Not at all  Social Connections: Moderately Integrated (07/05/2022)   Social Connection and Isolation Panel [NHANES]    Frequency of Communication with Friends and Family: Once a week    Frequency of Social Gatherings with Friends and Family: Twice a week    Attends Religious Services: More than 4 times per year    Active Member of [employer] or Organizations: No    Attends Banker Meetings: Never    Marital Status: Married    Family History  Problem Relation Age of Onset   Heart Problems Mother    Heart attack Father    Heart disease Father    Cancer Other    Colon cancer Maternal Aunt     Outpatient Encounter Medications as of 03/13/2023  Medication Sig   Albuterol Sulfate 108 (90 Base) MCG/ACT AEPB Inhale 1-2 puffs into the lungs 4 (four) times daily as needed (wheezing/shortness of breath.).    aspirin EC 81 MG tablet Take 1 tablet (81 mg total) by mouth daily.   atorvastatin (LIPITOR) 40 MG tablet TAKE ONE TABLET BY MOUTH AT BEDTIME FOR CHOLESTEROL   Calcium Carb-Cholecalciferol 600-10 MG-MCG TABS Take 1 tablet by mouth 2 (two) times daily.   calcium carbonate (OS-CAL) 1250 (500 Ca) MG chewable tablet CHEW 1-2 TABLETS BY MOUTH AT BEDTIME AS NEEDED FOR INDIGESTION   Carboxymethylcellulose Sod PF 0.25 % SOLN Place 1 drop into both eyes 4 (four) times daily as needed (dry/irritated eyes.).    carvedilol (COREG) 3.125 MG tablet Take 1 tablet (3.125 mg total) by mouth 2 (two) times daily with a meal.   Cholecalciferol 25 MCG  (1000 UT) tablet Take 1 tablet by mouth daily.   clobetasol ointment (TEMOVATE) 0.05 % Apply 1 application topically 2 (two) times daily as needed (psoriasis).    ezetimibe (ZETIA) 10 MG tablet Take 1 tablet by mouth daily.   fluticasone (CUTIVATE) 0.05 % cream Apply 1 application topically 2 (two) times daily as needed (psoriasis (face & groin areas)).    ibuprofen (ADVIL) 600 MG tablet Take 1 tablet (600 mg total) by mouth every 8 (eight) hours as needed.   ketoconazole (NIZORAL) 2 % shampoo Apply 1 application topically  2 (two) times a week. Applied to facial hair (beard) & scalp. Leave on 10 minutes prior to washing out.   losartan (COZAAR) 50 MG tablet Take 50 mg by mouth 2 (two) times daily.   Menthol-Methyl Salicylate (THERA-GESIC) 0.5-15 % CREA APPLY MODERATE AMOUNT TO AFFECTED AREA TWICE A DAY   nitroGLYCERIN (NITROSTAT) 0.4 MG SL tablet Place 1 tablet (0.4 mg total) under the tongue every 5 (five) minutes as needed for chest pain.   pantoprazole (PROTONIX) 40 MG tablet Take 40 mg by mouth daily.   umeclidinium-vilanterol (ANORO ELLIPTA) 62.5-25 MCG/INH AEPB Inhale 1 puff into the lungs daily.   Ustekinumab (STELARA [place]) Inject 1 Dose into the skin every 3 (three) months. Every 12 weeks   [DISCONTINUED] metFORMIN (GLUCOPHAGE) 500 MG tablet Take 1 tablet (500 mg total) by mouth 2 (two) times daily with a meal.   metFORMIN (GLUCOPHAGE) 500 MG tablet Take 1 tablet (500 mg total) by mouth daily with breakfast.   No facility-administered encounter medications on file as of 03/13/2023.    ALLERGIES: Allergies  Allergen Reactions   Sulfa Antibiotics Other (See Comments)    Sulfa drugs (unaware of this reaction per patient spouse)      VACCINATION STATUS: Immunization History  Administered Date(s) Administered   Fluad Quad(high Dose 65+) 07/03/2020   Influenza Inj Mdck Quad Pf 08/11/2016   Influenza, High Dose Seasonal PF 09/05/2016   Influenza, Seasonal, Injecte, Preservative Fre  06/18/2019   Influenza,inj,Quad PF,6+ Mos 07/07/2017, 07/02/2018, 06/18/2019   Influenza-Unspecified 06/18/2019, 06/21/2022   Moderna Sars-Covid-2 Vaccination 11/07/2019, 12/05/2019, 08/04/2020, 01/12/2021   Pneumococcal Conjugate-13 06/07/2016   Pneumococcal Polysaccharide-23 07/07/2017   Td (Adult) 06/07/2016   Tdap 11/03/2004   Zoster Recombinat (Shingrix) 10/04/2019, 03/20/2020    Diabetes He presents for his follow-up diabetic visit. He has type 2 diabetes mellitus. Onset time: Diagnosed at approx age of 65. His disease course has been improving. There are no hypoglycemic associated symptoms. (He did not have any symptoms of hypoglycemia, despite glucose reading of 21. ) Pertinent negatives for diabetes include no blurred vision, no fatigue, no polydipsia, no polyuria and no weight loss. There are no hypoglycemic complications. Symptoms are improving. Diabetic complications include heart disease and nephropathy. Risk factors for coronary artery disease include diabetes mellitus, dyslipidemia, hypertension and male sex. Current diabetic treatment includes diet and oral agent (monotherapy). He is compliant with treatment most of the time. His weight is decreasing steadily. He is following a generally healthy diet. When asked about meal planning, he reported none. He has not had a previous visit with a dietitian. He participates in exercise intermittently. His home blood glucose trend is decreasing steadily. His breakfast blood glucose range is generally 90-110 mg/dl. (He presents today, accompanied by his wife, with his meter showing inconsistent glucose monitoring.  He was not asked to routinely monitor glucose due to monotherapy with Metformin only.  His POCT A1c today is 5.6%, improving from last visit of 6.3%.) An ACE inhibitor/angiotensin II receptor blocker is being taken. He does not see a podiatrist.Eye exam is current.  Hyperlipidemia This is a chronic problem. The current episode started  more than 1 year ago. The problem is controlled. Recent lipid tests were reviewed and are normal. Exacerbating diseases include diabetes. Factors aggravating his hyperlipidemia include beta blockers. Current antihyperlipidemic treatment includes statins. The current treatment provides significant improvement of lipids. There are no compliance problems.  Risk factors for coronary artery disease include diabetes mellitus, dyslipidemia, hypertension and male sex.  Hypertension  This is a chronic problem. The current episode started more than 1 year ago. The problem has been resolved since onset. The problem is controlled. Pertinent negatives include no blurred vision. There are no associated agents to hypertension. Risk factors for coronary artery disease include diabetes mellitus, dyslipidemia and male gender. Past treatments include angiotensin blockers and beta blockers. The current treatment provides moderate improvement. There are no compliance problems.  Hypertensive end-organ damage includes CAD/MI.    Review of systems  Constitutional: + stable body weight,  current Body mass index is 22.75 kg/m. , + fatigue, no subjective hyperthermia, no subjective hypothermia Eyes: + blurry vision (recently diagnosed with cataracts), no xerophthalmia ENT: no sore throat, no nodules palpated in throat, no dysphagia/odynophagia, no hoarseness Cardiovascular: no chest pain, no shortness of breath, no palpitations, no leg swelling Respiratory: no cough, no shortness of breath Gastrointestinal: no nausea/vomiting/diarrhea Musculoskeletal: + chronic muscle/joint aches (hx of RA) Skin: no rashes, no hyperemia Neurological: no tremors, no numbness, no tingling, no dizziness Psychiatric: no depression, no anxiety  Objective:     BP 99/64 (BP Location: Left Arm, Patient Position: Sitting, Cuff Size: Normal)   Pulse (!) 56   Ht 6\' 1"  (1.854 m)   Wt 172 lb 6.4 oz (78.2 kg)   BMI 22.75 kg/m   Wt Readings from  Last 3 Encounters:  03/13/23 172 lb 6.4 oz (78.2 kg)  01/06/23 179 lb 6.4 oz (81.4 kg)  11/30/22 182 lb (82.6 kg)     BP Readings from Last 3 Encounters:  03/13/23 99/64  01/06/23 122/65  11/30/22 124/74     Physical Exam- Limited  Constitutional:  Body mass index is 22.75 kg/m. , not in acute distress, anxious state of mind Eyes:  EOMI, no exophthalmos Musculoskeletal: no gross deformities, strength intact in all four extremities, no gross restriction of joint movements Skin:  no rashes, no hyperemia Neurological: no tremor with outstretched hands   Diabetic Foot Exam - Simple   No data filed     CMP ( most recent) CMP     Component Value Date/Time   NA 138 08/22/2022 1156   K 4.8 08/22/2022 1156   CL 103 08/22/2022 1156   CO2 21 08/22/2022 1156   GLUCOSE 142 (H) 08/22/2022 1156   GLUCOSE 448 (H) 11/28/2020 1328   BUN 23 08/22/2022 1156   CREATININE 1.27 08/22/2022 1156   CALCIUM 10.3 (H) 08/22/2022 1156   PROT 6.9 08/22/2022 1156   ALBUMIN 4.6 08/22/2022 1156   AST 26 08/22/2022 1156   ALT 33 08/22/2022 1156   ALKPHOS 50 08/22/2022 1156   BILITOT 1.3 (H) 08/22/2022 1156   GFRNONAA >60 11/28/2020 1328   GFRAA 81 06/24/2021 0000     Diabetic Labs (most recent): Lab Results  Component Value Date   HGBA1C 5.6 03/13/2023   HGBA1C 6.3 (A) 11/30/2022   HGBA1C 11.1 (A) 06/28/2022   MICROALBUR 10 mg/L 11/30/2022   MICROALBUR 30 12/23/2021   MICROALBUR 10 08/16/2021     Lipid Panel ( most recent) Lipid Panel     Component Value Date/Time   CHOL 86 12/03/2020 0000   CHOL 212 (H) 09/23/2015 1540   TRIG 48 12/23/2021 0000   HDL 43 11/08/2018 0000   HDL 41 09/23/2015 1540   CHOLHDL 3.7 11/26/2015 0815   VLDL 17 11/26/2015 0815   LDLCALC 40 12/23/2021 0000   LDLCALC 137 (H) 09/23/2015 1540   LABVLDL 34 09/23/2015 1540      Lab Results  Component  Value Date   TSH 2.60 12/23/2021   TSH 2.69 06/24/2021   TSH 2.61 11/08/2018            Assessment & Plan:   1) Controlled Type 2 Diabetes without complications  He presents today, accompanied by his wife, with his meter showing inconsistent glucose monitoring.  He was not asked to routinely monitor glucose due to monotherapy with Metformin only.  His POCT A1c today is 5.6%, improving from last visit of 6.3%.  - Grant Cardenas has currently uncontrolled symptomatic type 2 DM since 67 years of age.   -Recent labs reviewed.  - I had a long discussion with him about the progressive nature of diabetes and the pathology behind its complications. -his diabetes is complicated by CAD (history of MI) and he remains at a high risk for more acute and chronic complications which include CVA, CKD, retinopathy, and neuropathy. These are all discussed in detail with him.  - Nutritional counseling repeated at each appointment due to patients tendency to fall back in to old habits.  - The patient admits there is a room for improvement in their diet and drink choices. -  Suggestion is made for the patient to avoid simple carbohydrates from their diet including Cakes, Sweet Desserts / Pastries, Ice Cream, Soda (diet and regular), Sweet Tea, Candies, Chips, Cookies, Sweet Pastries, Store Bought Juices, Alcohol in Excess of 1-2 drinks a day, Artificial Sweeteners, Coffee Creamer, and "Sugar-free" Products. This will help patient to have stable blood glucose profile and potentially avoid unintended weight gain.   - I encouraged the patient to switch to unprocessed or minimally processed complex starch and increased protein intake (animal or plant source), fruits, and vegetables.   - Patient is advised to stick to a routine mealtimes to eat 3 meals a day and avoid unnecessary snacks (to snack only to correct hypoglycemia).  - I have approached him with the following individualized plan to manage  his diabetes and patient agrees:   -Given his excellent control, he can lower his Metformin to  500 mg po once daily after breakfast.  May look to discontinue at next visit.   He is advised to check glucose as needed.  - he is not a good candidate for incretin therapy due to body habitus.  - Specific targets for  A1c;  LDL, HDL,  and Triglycerides were discussed with the patient.  2) Blood Pressure /Hypertension:  his blood pressure is controlled to target.   he is advised to continue his current medications including Carvedilol 3.125 mg po twice daily and Losartan 50 mg p.o. daily with breakfast.  3) Lipids/Hyperlipidemia:    Review of his recent lipid panel from 12/23/21 showed controlled  LDL at 40 .  he  is advised to continue Lipitor 40 mg po daily at bedtime and Zetia 10 mg daily at bedtime.  Side effects and precautions discussed with him.  He does have elevated LFTs, which started soon after treatment for his psoriasis, he does follow with GI.  He does NOT drink alcohol.  Has appt with PCP coming up in October for physical.  4)  Weight/Diet:  his Body mass index is 22.75 kg/m.  -  he is not a candidate for weight loss. Exercise, and detailed carbohydrates information provided  -  detailed on discharge instructions.  5) Chronic Care/Health Maintenance: -he is on ACEI/ARB and Statin medications and is encouraged to initiate and continue to follow up with Ophthalmology, Dentist, Podiatrist at least  yearly or according to recommendations, and advised to stay away from smoking. I have recommended yearly flu vaccine and pneumonia vaccine at least every 5 years; moderate intensity exercise for up to 150 minutes weekly; and sleep for at least 7 hours a day.  - he is advised to maintain close follow up with Anabel Halon, MD for primary care needs, as well as his other providers for optimal and coordinated care.      I spent  22  minutes in the care of the patient today including review of labs from CMP, Lipids, Thyroid Function, Hematology (current and previous including  abstractions from other facilities); face-to-face time discussing  his blood glucose readings/logs, discussing hypoglycemia and hyperglycemia episodes and symptoms, medications doses, his options of short and long term treatment based on the latest standards of care / guidelines;  discussion about incorporating lifestyle medicine;  and documenting the encounter. Risk reduction counseling performed per USPSTF guidelines to reduce obesity and cardiovascular risk factors.     Please refer to Patient Instructions for Blood Glucose Monitoring and Insulin/Medications Dosing Guide"  in media tab for additional information. Please  also refer to " Patient Self Inventory" in the Media  tab for reviewed elements of pertinent patient history.  Grant Cardenas participated in the discussions, expressed understanding, and voiced agreement with the above plans.  All questions were answered to his satisfaction. he is encouraged to contact clinic should he have any questions or concerns prior to his return visit.   Follow up plan: - Return in about 4 months (around 07/13/2023) for Diabetes F/U with A1c in office, No previsit labs.  Ronny Bacon, [facility] [address] [place], [place] [address] Phone: [phone] Fax: [phone]  03/13/2023, 11:12 AM

## 2023-04-10 NOTE — H&P (Signed)
Surgical History & Physical  Patient Name: Grant Cardenas  DOB: 03/12/1956  Surgery: Cataract extraction with intraocular lens implant phacoemulsification; Left Eye Surgeon: Pecolia Ades MD Surgery Date: 04/14/2023 Pre-Op Date: 02/21/2023  HPI: A 24 Yr. old male patient . Last eye exam was a couple weeks ago at the [place]. Pt states he was diagnosed with Cataracts OU about a year ago and had them rechecked several weeks ago. Pt states his vision is worse OS than OD. Patient has difficulties driving during the day and at night due to glare problems. Patient has limited his night driving because of his vision. At times he will walk to the TV to see ball scores. This is negatively affecting the patient's quality of life and the patient is unable to function adequately in life with the current level of vision. Pt states he has alot of floaters OU, but they have remain unchanged. Pt c/o dry eyes from Pollen and winter heat. Drops: Theratears QID OU Last A1C reading was 6.9. Last BS reading was 82. HPI was performed by Pecolia Ades .  Medical History: Dry Eyes Cataracts  Diabetes Heart Problem High Blood Pressure COPD  Review of Systems Negative Allergic/Immunologic Cardiovascular: Heart problems, Hypertensive Negative Constitutional Negative Ear, Nose, Mouth & Throat Endocrine: Diabetic Negative Eyes Negative Gastrointestinal Negative Genitourinary Negative Hemotologic/Lymphatic Negative Integumentary Negative Musculoskeletal Negative Neurological Negative Psychiatry Respiratory: COPD  Social Former smoker   Medication Anoro Ellipta, Nitroglycerin, Albuterol, Clobetasol, Diclofenac, Stelara, Fluticasone, Metformin, Pantoprazole, Atorvastatin, Losartan, Cream for psoriasis, Cream for pain, Tylenol, Baby Aspirin, Carvedilol   Sx/Procedures Heart stent 09/13/2015  Drug Allergies  Sulfa  History & Physical: Heent: cataracts NECK: supple without bruits LUNGS: lungs  clear to auscultation CV: regular rate and rhythm Abdomen: soft and non-tender  Impression & Plan: Assessment: 1.  CATARACT AGE-RELATED NUCLEAR; Both Eyes (H25.13) 2.  Myopia ; Both Eyes (H52.13) 3.  VITREOUS FLOATERS; Both Eyes (H43.393)  Plan: 1.  Cataracts are visually significant and account for the patient's complaints. Discussed all risks, benefits, procedures and recovery, including infection, loss of vision and eye, need for glasses after surgery or additional procedures. Patient understands changing glasses will not improve vision. Patient indicated understanding of procedure. All questions answered. Patient desires to have surgery, recommend phacoemulsification with intraocular lens. Patient to have preliminary testing necessary (Argos/IOL Master, Mac OCT, TOPO) Educational materials provided.  Plan: - Proceed with surgery OS, followed by OD - target -0.25 with DIB00 lens - no fuchs, no prior eye surgery, ok with lying flat - good dilation  2.  continue with current for now  3.  Discussed floaters may be more noticeable after cataract surgery

## 2023-04-11 ENCOUNTER — Encounter (HOSPITAL_COMMUNITY): Payer: Self-pay

## 2023-04-11 ENCOUNTER — Encounter (HOSPITAL_COMMUNITY)
Admission: RE | Admit: 2023-04-11 | Discharge: 2023-04-11 | Disposition: A | Discharge status: Home / Self Care | Payer: Medicare HMO | Source: Ambulatory Visit | Attending: Optometry | Admitting: Optometry

## 2023-04-11 ENCOUNTER — Other Ambulatory Visit: Payer: Self-pay

## 2023-04-14 ENCOUNTER — Ambulatory Visit (HOSPITAL_COMMUNITY): Payer: No Typology Code available for payment source | Admitting: Anesthesiology

## 2023-04-14 ENCOUNTER — Encounter (HOSPITAL_COMMUNITY): Payer: Self-pay | Admitting: Optometry

## 2023-04-14 ENCOUNTER — Ambulatory Visit (HOSPITAL_COMMUNITY)
Admission: RE | Admit: 2023-04-14 | Discharge: 2023-04-14 | Disposition: A | Discharge status: Home / Self Care | Payer: No Typology Code available for payment source | Attending: Optometry | Admitting: Optometry

## 2023-04-14 ENCOUNTER — Encounter (HOSPITAL_COMMUNITY)
Admission: RE | Disposition: A | Discharge status: Home / Self Care | Payer: Self-pay | Source: Home / Self Care | Attending: Optometry

## 2023-04-14 DIAGNOSIS — I252 Old myocardial infarction: Secondary | ICD-10-CM | POA: Insufficient documentation

## 2023-04-14 DIAGNOSIS — I25119 Atherosclerotic heart disease of native coronary artery with unspecified angina pectoris: Secondary | ICD-10-CM | POA: Diagnosis not present

## 2023-04-14 DIAGNOSIS — I2510 Atherosclerotic heart disease of native coronary artery without angina pectoris: Secondary | ICD-10-CM | POA: Insufficient documentation

## 2023-04-14 DIAGNOSIS — H2512 Age-related nuclear cataract, left eye: Secondary | ICD-10-CM

## 2023-04-14 DIAGNOSIS — I10 Essential (primary) hypertension: Secondary | ICD-10-CM | POA: Diagnosis not present

## 2023-04-14 DIAGNOSIS — F419 Anxiety disorder, unspecified: Secondary | ICD-10-CM | POA: Insufficient documentation

## 2023-04-14 DIAGNOSIS — Z87891 Personal history of nicotine dependence: Secondary | ICD-10-CM | POA: Insufficient documentation

## 2023-04-14 DIAGNOSIS — J449 Chronic obstructive pulmonary disease, unspecified: Secondary | ICD-10-CM | POA: Insufficient documentation

## 2023-04-14 DIAGNOSIS — Z955 Presence of coronary angioplasty implant and graft: Secondary | ICD-10-CM | POA: Insufficient documentation

## 2023-04-14 DIAGNOSIS — E1136 Type 2 diabetes mellitus with diabetic cataract: Secondary | ICD-10-CM | POA: Diagnosis not present

## 2023-04-14 DIAGNOSIS — F32A Depression, unspecified: Secondary | ICD-10-CM | POA: Diagnosis not present

## 2023-04-14 DIAGNOSIS — Z7984 Long term (current) use of oral hypoglycemic drugs: Secondary | ICD-10-CM | POA: Diagnosis not present

## 2023-04-14 DIAGNOSIS — E785 Hyperlipidemia, unspecified: Secondary | ICD-10-CM | POA: Insufficient documentation

## 2023-04-14 DIAGNOSIS — K219 Gastro-esophageal reflux disease without esophagitis: Secondary | ICD-10-CM | POA: Insufficient documentation

## 2023-04-14 HISTORY — PX: CATARACT EXTRACTION W/PHACO: SHX586

## 2023-04-14 LAB — GLUCOSE, CAPILLARY: Glucose-Capillary: 101 mg/dL — ABNORMAL HIGH (ref 70–99)

## 2023-04-14 SURGERY — PHACOEMULSIFICATION, CATARACT, WITH IOL INSERTION
Anesthesia: Monitor Anesthesia Care | Site: Eye | Laterality: Left

## 2023-04-14 MED ORDER — BSS IO SOLN
INTRAOCULAR | Status: DC | PRN
  Administered 2023-04-14: 15 mL via INTRAOCULAR

## 2023-04-14 MED ORDER — TETRACAINE HCL 0.5 % OP SOLN
1.0000 [drp] | OPHTHALMIC | Status: AC | PRN
  Administered 2023-04-14 (×3): 1 [drp] via OPHTHALMIC

## 2023-04-14 MED ORDER — SIGHTPATH DOSE#1 NA HYALUR & NA CHOND-NA HYALUR IO KIT
PACK | INTRAOCULAR | Status: DC | PRN
  Administered 2023-04-14: 1 via OPHTHALMIC

## 2023-04-14 MED ORDER — LIDOCAINE HCL 3.5 % OP GEL
1.0000 | Freq: Once | OPHTHALMIC | Status: AC
  Administered 2023-04-14: 1 via OPHTHALMIC

## 2023-04-14 MED ORDER — PHENYLEPHRINE HCL 2.5 % OP SOLN
1.0000 [drp] | OPHTHALMIC | Status: AC | PRN
  Administered 2023-04-14 (×3): 1 [drp] via OPHTHALMIC

## 2023-04-14 MED ORDER — LIDOCAINE HCL (PF) 1 % IJ SOLN
INTRAMUSCULAR | Status: DC | PRN
  Administered 2023-04-14: 1 mL

## 2023-04-14 MED ORDER — SODIUM CHLORIDE 0.9% FLUSH
INTRAVENOUS | Status: DC | PRN
  Administered 2023-04-14: 5 mL via INTRAVENOUS

## 2023-04-14 MED ORDER — MIDAZOLAM HCL 5 MG/5ML IJ SOLN
INTRAMUSCULAR | Status: DC | PRN
  Administered 2023-04-14: 1 mg via INTRAVENOUS

## 2023-04-14 MED ORDER — TROPICAMIDE 1 % OP SOLN
1.0000 [drp] | OPHTHALMIC | Status: AC | PRN
  Administered 2023-04-14 (×3): 1 [drp] via OPHTHALMIC

## 2023-04-14 MED ORDER — POVIDONE-IODINE 5 % OP SOLN
OPHTHALMIC | Status: DC | PRN
  Administered 2023-04-14: 1 via OPHTHALMIC

## 2023-04-14 MED ORDER — PHENYLEPHRINE-KETOROLAC 1-0.3 % IO SOLN
INTRAOCULAR | Status: DC | PRN
  Administered 2023-04-14: 500 mL via OPHTHALMIC

## 2023-04-14 MED ORDER — STERILE WATER FOR IRRIGATION IR SOLN
Status: DC | PRN
  Administered 2023-04-14: 250 mL

## 2023-04-14 MED ORDER — MIDAZOLAM HCL 2 MG/2ML IJ SOLN
INTRAMUSCULAR | Status: AC
  Filled 2023-04-14: qty 2

## 2023-04-14 SURGICAL SUPPLY — 14 items
CATARACT SUITE SIGHTPATH (MISCELLANEOUS) ×1 IMPLANT
CLOTH BEACON ORANGE TIMEOUT ST (SAFETY) ×1 IMPLANT
DRSG TEGADERM 4X4.75 (GAUZE/BANDAGES/DRESSINGS) ×1 IMPLANT
EYE SHIELD UNIVERSAL CLEAR (GAUZE/BANDAGES/DRESSINGS) IMPLANT
FEE CATARACT SUITE SIGHTPATH (MISCELLANEOUS) ×1 IMPLANT
GLOVE BIOGEL PI IND STRL 7.0 (GLOVE) ×2 IMPLANT
LENS IOL TECNIS EYHANCE 21.0 (Intraocular Lens) IMPLANT
NDL HYPO 18GX1.5 BLUNT FILL (NEEDLE) ×1 IMPLANT
NEEDLE HYPO 18GX1.5 BLUNT FILL (NEEDLE) ×1 IMPLANT
PAD ARMBOARD 7.5X6 YLW CONV (MISCELLANEOUS) ×1 IMPLANT
POSITIONER HEAD 8X9X4 ADT (SOFTGOODS) ×1 IMPLANT
SYR TB 1ML LL NO SAFETY (SYRINGE) ×1 IMPLANT
TAPE SURG TRANSPORE 1 IN (GAUZE/BANDAGES/DRESSINGS) IMPLANT
WATER STERILE IRR 250ML POUR (IV SOLUTION) ×1 IMPLANT

## 2023-04-14 NOTE — Discharge Instructions (Signed)
Please discharge patient when stable, will follow up today with Dr. Caylan Chenard at the [facility] [place] office immediately following discharge.  Leave shield in place until visit.  All paperwork with discharge instructions will be given at the office.  [facility] [place] Address:  [address]  , [place] [address]  Dr. Jonta Gastineau's Phone: [phone]  

## 2023-04-14 NOTE — Anesthesia Preprocedure Evaluation (Signed)
Anesthesia Evaluation  Patient identified by MRN, date of birth, ID band Patient awake    Reviewed: Allergy & Precautions, H&P , NPO status , Patient's Chart, lab work & pertinent test results, reviewed documented beta blocker date and time   Airway Mallampati: II  TM Distance: >3 FB Neck ROM: Full    Dental  (+) Edentulous Upper, Edentulous Lower   Pulmonary COPD,  COPD inhaler, former smoker   Pulmonary exam normal breath sounds clear to auscultation       Cardiovascular Exercise Tolerance: Good hypertension, Pt. on medications and Pt. on home beta blockers + angina  + CAD, + Past MI and + Cardiac Stents  Normal cardiovascular exam Rhythm:Regular Rate:Normal     Neuro/Psych  PSYCHIATRIC DISORDERS Anxiety Depression    negative neurological ROS     GI/Hepatic Neg liver ROS,GERD  Medicated and Controlled,,  Endo/Other  diabetes, Well Controlled, Type 2, Oral Hypoglycemic Agents    Renal/GU negative Renal ROS  negative genitourinary   Musculoskeletal negative musculoskeletal ROS (+)    Abdominal   Peds negative pediatric ROS (+)  Hematology negative hematology ROS (+)   Anesthesia Other Findings   Reproductive/Obstetrics negative OB ROS                             Anesthesia Physical Anesthesia Plan  ASA: 3  Anesthesia Plan: MAC   Post-op Pain Management: Minimal or no pain anticipated   Induction: Intravenous  PONV Risk Score and Plan: Treatment may vary due to age or medical condition  Airway Management Planned: Nasal Cannula and Natural Airway  Additional Equipment:   Intra-op Plan:   Post-operative Plan:   Informed Consent: I have reviewed the patients History and Physical, chart, labs and discussed the procedure including the risks, benefits and alternatives for the proposed anesthesia with the patient or authorized representative who has indicated his/her understanding  and acceptance.       Plan Discussed with: CRNA and Surgeon  Anesthesia Plan Comments:        Anesthesia Quick Evaluation

## 2023-04-14 NOTE — Interval H&P Note (Signed)
History and Physical Interval Note:  04/14/2023 11:26 AM  The H and P was reviewed and updated. The patient was examined.  No changes were found after exam.  The surgical eye was marked.  Puneet Selden

## 2023-04-14 NOTE — Op Note (Signed)
Date of procedure: 04/14/23  Pre-operative diagnosis: Visually significant age-related nuclear cataract, Left Eye (H25.12)  Post-operative diagnosis: Visually significant age-related nuclear cataract, Left Eye  Procedure: Removal of cataract via phacoemulsification and insertion of intra-ocular lens J&J DIB00 +21.0D into the capsular bag of the Left Eye  Attending surgeon: Ronal Fear, MD  Anesthesia: MAC, Topical Akten  Complications: None  Estimated Blood Loss: <53mL (minimal)  Specimens: None  Implants:  Implant Name Type Inv. Item Serial No. Manufacturer Lot No. LRB No. Used Action  LENS IOL TECNIS EYHANCE 21.0 - [id number] Intraocular Lens LENS IOL TECNIS EYHANCE 21.0 [id number] SIGHTPATH  Left 1 Implanted    Indications:  Visually significant age-related cataract, Left Eye  Procedure:  The patient was seen and identified in the pre-operative area. The operative eye was identified and dilated.  The operative eye was marked.  Topical anesthesia was administered to the operative eye.     The patient was then to the operative suite and placed in the supine position.  A timeout was performed confirming the patient, procedure to be performed, and all other relevant information.   The patient's face was prepped and draped in the usual fashion for intra-ocular surgery.  A lid speculum was placed into the operative eye and the surgical microscope moved into place and focused.  An inferotemporal paracentesis was created using a 20 gauge paracentesis blade.  BSS mixed with Omidria, followed by 1% lidocaine was injected into the anterior chamber.  Viscoelastic was injected into the anterior chamber.  A temporal clear-corneal main wound incision was created using a 2.63mm microkeratome.  A continuous curvilinear capsulorrhexis was initiated using an irrigating cystitome and completed using capsulorrhexis forceps.  Hydrodissection and hydrodeliniation were performed.  Viscoelastic was  injected into the anterior chamber.  A phacoemulsification handpiece and a chopper as a second instrument were used to remove the nucleus and epinucleus. The irrigation/aspiration handpiece was used to remove any remaining cortical material.   The capsular bag was reinflated with viscoelastic, checked, and found to be intact.  The intraocular lens was inserted into the capsular bag.  The irrigation/aspiration handpiece was used to remove any remaining viscoelastic.  The clear corneal wound and paracentesis wounds were then hydrated and checked with Weck-Cels to be watertight.  The lid-speculum and drape was removed, and the patient's face was cleaned with a wet and dry 4x4.  Maxitrol drops were instilled onto the eye. A clear shield was taped over the eye. The patient was taken to the post-operative care unit in good condition, having tolerated the procedure well.  Post-Op Instructions: The patient will follow up at [facility] for a same day post-operative evaluation and will receive all other orders and instructions.

## 2023-04-14 NOTE — Transfer of Care (Signed)
Immediate Anesthesia Transfer of Care Note  Patient: Grant Cardenas  Procedure(s) Performed: CATARACT EXTRACTION PHACO AND INTRAOCULAR LENS PLACEMENT (IOC) (Left: Eye)  Patient Location: Short Stay  Anesthesia Type:MAC  Level of Consciousness: awake  Airway & Oxygen Therapy: Patient Spontanous Breathing  Post-op Assessment: Report given to RN  Post vital signs: Reviewed and stable  Last Vitals:  Vitals Value Taken Time  BP    Temp    Pulse    Resp    SpO2      Last Pain:  Vitals:   04/14/23 1043  TempSrc: Oral  PainSc: 0-No pain         Complications: No notable events documented.

## 2023-04-14 NOTE — Anesthesia Postprocedure Evaluation (Signed)
Anesthesia Post Note  Patient: Grant Cardenas  Procedure(s) Performed: CATARACT EXTRACTION PHACO AND INTRAOCULAR LENS PLACEMENT (IOC) (Left: Eye)  Patient location during evaluation: Short Stay Anesthesia Type: MAC Level of consciousness: awake and alert Pain management: pain level controlled Vital Signs Assessment: post-procedure vital signs reviewed and stable Respiratory status: spontaneous breathing Cardiovascular status: blood pressure returned to baseline Postop Assessment: no apparent nausea or vomiting Anesthetic complications: no   No notable events documented.   Last Vitals:  Vitals:   04/14/23 1043  BP: 113/71  Pulse: (!) 55  Resp: (!) 23  Temp: 36.7 C  SpO2: 100%    Last Pain:  Vitals:   04/14/23 1043  TempSrc: Oral  PainSc: 0-No pain                 Valor [place]

## 2023-04-19 ENCOUNTER — Encounter (HOSPITAL_COMMUNITY): Payer: Self-pay | Admitting: Optometry

## 2023-04-20 NOTE — H&P (Signed)
Surgical History & Physical  Patient Name: Grant Cardenas  DOB: November 09, 1955  Surgery: Cataract extraction with intraocular lens implant phacoemulsification; Right Eye Surgeon: Pecolia Ades MD Surgery Date: 04/28/2023 Pre-Op Date: 04/18/2023  HPI: A 19 Yr. old male patient present for 4 day post op OS. Patient c/o a pulling sensation OS temporally. Vision has improved. OD patient describes as "looking through muddy water." Difficulties due to glare problems during sunny days and at night. He has stopped driving at night due to vision. Difficulties reading street signs. This is negatively affecting the patient's quality of life and the patient is unable to function adequately in life with the current level of vision. Patient would like to proceed with cataract sx.  Medical History: Dry Eyes Cataracts  Diabetes Heart Problem High Blood Pressure Lung Problems  Review of Systems Endocrine diabetic Cardiovascular heart problems, HTN Respiratory COPD, Uses an inhaler Q AM  Social Former smoker   Medication Pred/moxi/bromfenac,  Anoro Ellipta, Nitroglycerin, Albuterol, Clobetasol, Diclofenac, Stelara, Fluticasone, Metformin, Pantoprazole, Atorvastatin, Losartan, Cream for psoriasis, Cream for pain, Tylenol, Baby Aspirin, Carvedilol  Sx/Procedures Phaco c IOL OS Heart stent 09/13/2015  Drug Allergies  Sulfa   History & Physical: Heent: PCL OS, cataract OD NECK: supple without bruits LUNGS: lungs clear to auscultation CV: regular rate and rhythm Abdomen: soft and non-tender  Impression & Plan: Assessment: 1.  CATARACT EXTRACTION STATUS; Left Eye (Z98.42) 2.  INTRAOCULAR LENS IOL ; Left Eye (Z96.1) 3.  CATARACT AGE-RELATED NUCLEAR; Both Eyes (H25.13)  Plan: 1.  POD4 exam, Doing well. All post-op precautions discussed and instructions reviewed. Written instructions given.  2.  See above  3.  Cataracts are visually significant and account for the patient's complaints.  Discussed all risks, benefits, procedures and recovery, including infection, loss of vision and eye, need for glasses after surgery or additional procedures. Patient understands changing glasses will not improve vision. Patient indicated understanding of procedure. All questions answered. Patient desires to have surgery, recommend phacoemulsification with intraocular lens. Patient to have preliminary testing necessary ([employer]/IOL Master, Mac OCT, TOPO) Educational materials provided.  Plan: - Proceed with surgery OD when ready - target -0.25 with DIB00 lens - no fuchs, no prior eye surgery, ok with lying flat - good dilation

## 2023-04-24 ENCOUNTER — Encounter (HOSPITAL_COMMUNITY): Payer: Self-pay

## 2023-04-24 ENCOUNTER — Encounter (HOSPITAL_COMMUNITY)
Admission: RE | Admit: 2023-04-24 | Discharge: 2023-04-24 | Disposition: A | Discharge status: Home / Self Care | Payer: Medicare HMO | Source: Ambulatory Visit | Attending: Optometry | Admitting: Optometry

## 2023-04-28 ENCOUNTER — Ambulatory Visit (HOSPITAL_COMMUNITY): Payer: No Typology Code available for payment source | Admitting: Anesthesiology

## 2023-04-28 ENCOUNTER — Encounter (HOSPITAL_COMMUNITY)
Admission: RE | Disposition: A | Discharge status: Home / Self Care | Payer: Self-pay | Source: Home / Self Care | Attending: Optometry

## 2023-04-28 ENCOUNTER — Encounter (HOSPITAL_COMMUNITY): Payer: Self-pay | Admitting: Optometry

## 2023-04-28 ENCOUNTER — Ambulatory Visit (HOSPITAL_COMMUNITY)
Admission: RE | Admit: 2023-04-28 | Discharge: 2023-04-28 | Disposition: A | Discharge status: Home / Self Care | Payer: No Typology Code available for payment source | Attending: Optometry | Admitting: Optometry

## 2023-04-28 ENCOUNTER — Other Ambulatory Visit: Payer: Self-pay

## 2023-04-28 DIAGNOSIS — Z955 Presence of coronary angioplasty implant and graft: Secondary | ICD-10-CM | POA: Diagnosis not present

## 2023-04-28 DIAGNOSIS — I25119 Atherosclerotic heart disease of native coronary artery with unspecified angina pectoris: Secondary | ICD-10-CM

## 2023-04-28 DIAGNOSIS — Z87891 Personal history of nicotine dependence: Secondary | ICD-10-CM | POA: Diagnosis not present

## 2023-04-28 DIAGNOSIS — Z7984 Long term (current) use of oral hypoglycemic drugs: Secondary | ICD-10-CM

## 2023-04-28 DIAGNOSIS — K219 Gastro-esophageal reflux disease without esophagitis: Secondary | ICD-10-CM | POA: Insufficient documentation

## 2023-04-28 DIAGNOSIS — H2511 Age-related nuclear cataract, right eye: Secondary | ICD-10-CM | POA: Diagnosis present

## 2023-04-28 DIAGNOSIS — E1136 Type 2 diabetes mellitus with diabetic cataract: Secondary | ICD-10-CM

## 2023-04-28 DIAGNOSIS — I252 Old myocardial infarction: Secondary | ICD-10-CM | POA: Diagnosis not present

## 2023-04-28 DIAGNOSIS — Z9842 Cataract extraction status, left eye: Secondary | ICD-10-CM | POA: Insufficient documentation

## 2023-04-28 DIAGNOSIS — Z961 Presence of intraocular lens: Secondary | ICD-10-CM | POA: Diagnosis not present

## 2023-04-28 DIAGNOSIS — I10 Essential (primary) hypertension: Secondary | ICD-10-CM

## 2023-04-28 HISTORY — PX: CATARACT EXTRACTION W/PHACO: SHX586

## 2023-04-28 LAB — GLUCOSE, CAPILLARY: Glucose-Capillary: 99 mg/dL (ref 70–99)

## 2023-04-28 SURGERY — PHACOEMULSIFICATION, CATARACT, WITH IOL INSERTION
Anesthesia: Monitor Anesthesia Care | Site: Eye | Laterality: Right

## 2023-04-28 MED ORDER — PHENYLEPHRINE HCL 2.5 % OP SOLN
1.0000 [drp] | OPHTHALMIC | Status: AC
  Administered 2023-04-28 (×3): 1 [drp] via OPHTHALMIC

## 2023-04-28 MED ORDER — MIDAZOLAM HCL 2 MG/2ML IJ SOLN
INTRAMUSCULAR | Status: DC | PRN
  Administered 2023-04-28: 1 mg via INTRAVENOUS

## 2023-04-28 MED ORDER — PHENYLEPHRINE-KETOROLAC 1-0.3 % IO SOLN
INTRAOCULAR | Status: DC | PRN
  Administered 2023-04-28: 500 mL via OPHTHALMIC

## 2023-04-28 MED ORDER — BSS IO SOLN
INTRAOCULAR | Status: DC | PRN
  Administered 2023-04-28: 15 mL via INTRAOCULAR

## 2023-04-28 MED ORDER — TROPICAMIDE 1 % OP SOLN
1.0000 [drp] | OPHTHALMIC | Status: AC
  Administered 2023-04-28 (×3): 1 [drp] via OPHTHALMIC

## 2023-04-28 MED ORDER — TETRACAINE HCL 0.5 % OP SOLN
1.0000 [drp] | OPHTHALMIC | Status: AC
  Administered 2023-04-28 (×3): 1 [drp] via OPHTHALMIC

## 2023-04-28 MED ORDER — POVIDONE-IODINE 5 % OP SOLN
OPHTHALMIC | Status: DC | PRN
  Administered 2023-04-28: 1 via OPHTHALMIC

## 2023-04-28 MED ORDER — SIGHTPATH DOSE#1 NA HYALUR & NA CHOND-NA HYALUR IO KIT
PACK | INTRAOCULAR | Status: DC | PRN
  Administered 2023-04-28: 1 via OPHTHALMIC

## 2023-04-28 MED ORDER — MIDAZOLAM HCL 2 MG/2ML IJ SOLN
INTRAMUSCULAR | Status: AC
  Filled 2023-04-28: qty 2

## 2023-04-28 MED ORDER — STERILE WATER FOR IRRIGATION IR SOLN
Status: DC | PRN
  Administered 2023-04-28: 1

## 2023-04-28 MED ORDER — LIDOCAINE HCL 3.5 % OP GEL
1.0000 | Freq: Once | OPHTHALMIC | Status: AC
  Administered 2023-04-28: 1 via OPHTHALMIC

## 2023-04-28 MED ORDER — LACTATED RINGERS IV SOLN: INTRAVENOUS | Status: DC

## 2023-04-28 MED ORDER — FENTANYL CITRATE (PF) 100 MCG/2ML IJ SOLN
INTRAMUSCULAR | Status: DC | PRN
  Administered 2023-04-28: 50 ug via INTRAVENOUS

## 2023-04-28 MED ORDER — SODIUM CHLORIDE 0.9% FLUSH
INTRAVENOUS | Status: DC | PRN
  Administered 2023-04-28: 6 mL via INTRAVENOUS

## 2023-04-28 MED ORDER — LIDOCAINE HCL (PF) 1 % IJ SOLN
INTRAMUSCULAR | Status: DC | PRN
  Administered 2023-04-28: 2 mL

## 2023-04-28 MED ORDER — FENTANYL CITRATE (PF) 100 MCG/2ML IJ SOLN
INTRAMUSCULAR | Status: AC
  Filled 2023-04-28: qty 2

## 2023-04-28 SURGICAL SUPPLY — 13 items
CATARACT SUITE SIGHTPATH (MISCELLANEOUS) ×1 IMPLANT
DRSG TEGADERM 4X4.75 (GAUZE/BANDAGES/DRESSINGS) ×1 IMPLANT
EYE SHIELD UNIVERSAL CLEAR (GAUZE/BANDAGES/DRESSINGS) IMPLANT
FEE CATARACT SUITE SIGHTPATH (MISCELLANEOUS) ×1 IMPLANT
GLOVE BIOGEL PI IND STRL 7.0 (GLOVE) ×2 IMPLANT
LENS IOL TECNIS EYHANCE 20.5 (Intraocular Lens) IMPLANT
NDL HYPO 18GX1.5 BLUNT FILL (NEEDLE) ×1 IMPLANT
NEEDLE HYPO 18GX1.5 BLUNT FILL (NEEDLE) ×1 IMPLANT
PAD ARMBOARD 7.5X6 YLW CONV (MISCELLANEOUS) ×1 IMPLANT
POSITIONER HEAD 8X9X4 ADT (SOFTGOODS) ×1 IMPLANT
SYR TB 1ML LL NO SAFETY (SYRINGE) ×1 IMPLANT
TAPE SURG TRANSPORE 1 IN (GAUZE/BANDAGES/DRESSINGS) IMPLANT
WATER STERILE IRR 250ML POUR (IV SOLUTION) ×1 IMPLANT

## 2023-04-28 NOTE — Discharge Instructions (Signed)
Please discharge patient when stable, will follow up today with Dr. Snyder at the [facility] office immediately following discharge.  Leave shield in place until visit.  All paperwork with discharge instructions will be given at the office.  [facility] Address:  [address]  [place], [place] [address]  Dr. Snyder's Phone: [phone]  

## 2023-04-28 NOTE — Interval H&P Note (Signed)
History and Physical Interval Note:  04/28/2023 8:40 AM  The H and P was reviewed and updated. The patient was examined.  No changes were found after exam.  The surgical eye was marked.  Grant Cardenas

## 2023-04-28 NOTE — Op Note (Signed)
Date of procedure: 04/28/23  Pre-operative diagnosis: Visually significant age-related nuclear cataract, Right Eye (H25.11)  Post-operative diagnosis: Visually significant age-related nuclear cataract, Right Eye  Procedure: Removal of cataract via phacoemulsification and insertion of intra-ocular lens J&J DIBOO +20.5D into the capsular bag of the Right Eye  Attending surgeon: Pecolia Ades, MD  Anesthesia: MAC, Topical Akten  Complications: None  Estimated Blood Loss: <77mL (minimal)  Specimens: None  Implants:  Implant Name Type Inv. Item Serial No. Manufacturer Lot No. LRB No. Used Action  LENS IOL TECNIS EYHANCE 20.5 - [id number] Intraocular Lens LENS IOL TECNIS EYHANCE 20.5 [id number] [employer]  Right 1 Implanted    Indications:  Visually significant age-related cataract, Right Eye  Procedure:  The patient was seen and identified in the pre-operative area. The operative eye was identified and dilated.  The operative eye was marked.  Topical anesthesia was administered to the operative eye.     The patient was then to the operative suite and placed in the supine position.  A timeout was performed confirming the patient, procedure to be performed, and all other relevant information.   The patient's face was prepped and draped in the usual fashion for intra-ocular surgery.  A lid speculum was placed into the operative eye and the surgical microscope moved into place and focused.  A superotemporal paracentesis was created using a 20 gauge paracentesis blade.  BSS mixed with Omidria, followed by 1% lidocaine was injected into the anterior chamber.  Viscoelastic was injected into the anterior chamber.  A temporal clear-corneal main wound incision was created using a 2.32mm microkeratome.  A continuous curvilinear capsulorrhexis was initiated using an irrigating cystitome and completed using capsulorrhexis forceps.  Hydrodissection and hydrodeliniation were performed.  Viscoelastic was  injected into the anterior chamber.  A phacoemulsification handpiece and a chopper as a second instrument were used to remove the nucleus and epinucleus. The irrigation/aspiration handpiece was used to remove any remaining cortical material.   The capsular bag was reinflated with viscoelastic, checked, and found to be intact.  The intraocular lens was inserted into the capsular bag.  The irrigation/aspiration handpiece was used to remove any remaining viscoelastic.  The clear corneal wound and paracentesis wounds were then hydrated and checked with Weck-Cels to be watertight.  The lid-speculum and drape was removed, and the patient's face was cleaned with a wet and dry 4x4.  Maxitrol drops were instilled onto the eye. A clear shield was taped over the eye. The patient was taken to the post-operative care unit in good condition, having tolerated the procedure well.  Post-Op Instructions: The patient will follow up at [facility] for a same day post-operative evaluation and will receive all other orders and instructions.

## 2023-04-28 NOTE — Anesthesia Preprocedure Evaluation (Signed)
Anesthesia Evaluation  Patient identified by MRN, date of birth, ID band Patient awake    Reviewed: Allergy & Precautions, H&P , NPO status , Patient's Chart, lab work & pertinent test results, reviewed documented beta blocker date and time   Airway Mallampati: II  TM Distance: >3 FB Neck ROM: Full    Dental  (+) Edentulous Upper, Edentulous Lower   Pulmonary COPD,  COPD inhaler, former smoker   Pulmonary exam normal breath sounds clear to auscultation       Cardiovascular Exercise Tolerance: Good hypertension, Pt. on medications and Pt. on home beta blockers + angina  + CAD, + Past MI and + Cardiac Stents  Normal cardiovascular exam Rhythm:Regular Rate:Normal     Neuro/Psych  PSYCHIATRIC DISORDERS Anxiety Depression    negative neurological ROS     GI/Hepatic Neg liver ROS,GERD  Medicated and Controlled,,  Endo/Other  diabetes, Well Controlled, Type 2, Oral Hypoglycemic Agents    Renal/GU negative Renal ROS  negative genitourinary   Musculoskeletal negative musculoskeletal ROS (+)    Abdominal   Peds negative pediatric ROS (+)  Hematology negative hematology ROS (+)   Anesthesia Other Findings   Reproductive/Obstetrics negative OB ROS                             Anesthesia Physical Anesthesia Plan  ASA: 3  Anesthesia Plan: MAC   Post-op Pain Management: Minimal or no pain anticipated   Induction: Intravenous  PONV Risk Score and Plan: Treatment may vary due to age or medical condition  Airway Management Planned: Nasal Cannula and Natural Airway  Additional Equipment:   Intra-op Plan:   Post-operative Plan:   Informed Consent: I have reviewed the patients History and Physical, chart, labs and discussed the procedure including the risks, benefits and alternatives for the proposed anesthesia with the patient or authorized representative who has indicated his/her understanding  and acceptance.       Plan Discussed with: CRNA and Surgeon  Anesthesia Plan Comments:        Anesthesia Quick Evaluation

## 2023-04-28 NOTE — Transfer of Care (Addendum)
Immediate Anesthesia Transfer of Care Note  Patient: Grant Cardenas  Procedure(s) Performed: CATARACT EXTRACTION PHACO AND INTRAOCULAR LENS PLACEMENT (IOC) (Right: Eye)  Patient Location: Short Stay  Anesthesia Type:MAC  Level of Consciousness: awake and patient cooperative  Airway & Oxygen Therapy: Patient Spontanous Breathing  Post-op Assessment: Report given to RN and Post -op Vital signs reviewed and stable  Post vital signs: Reviewed and stable  Last Vitals:  Vitals Value Taken Time  BP 125/77 04/28/23   0931  Temp 36.6 04/28/23   0931  Pulse 51 04/28/23   0931  Resp 16 04/28/23   0931  SpO2 98% 04/28/23   0931    Last Pain:  Vitals:   04/28/23 0814  TempSrc: Oral  PainSc: 0-No pain         Complications: No notable events documented.

## 2023-05-01 ENCOUNTER — Encounter (HOSPITAL_COMMUNITY): Payer: Self-pay | Admitting: Optometry

## 2023-05-02 NOTE — Anesthesia Postprocedure Evaluation (Signed)
Anesthesia Post Note  Patient: COLMAN WHALE  Procedure(s) Performed: CATARACT EXTRACTION PHACO AND INTRAOCULAR LENS PLACEMENT (IOC) (Right: Eye)  Patient location during evaluation: Phase II Anesthesia Type: MAC Level of consciousness: awake Pain management: pain level controlled Vital Signs Assessment: post-procedure vital signs reviewed and stable Respiratory status: spontaneous breathing and respiratory function stable Cardiovascular status: blood pressure returned to baseline and stable Postop Assessment: no headache and no apparent nausea or vomiting Anesthetic complications: no Comments: Late entry   No notable events documented.   Last Vitals:  Vitals:   04/28/23 0814 04/28/23 0931  BP: 132/71 125/77  Pulse: (!) 50 (!) 51  Resp: (!) 22 16  Temp: 36.7 C 36.6 C  SpO2: 100% 98%    Last Pain:  Vitals:   05/01/23 1430  TempSrc:   PainSc: 0-No pain                 Windell Norfolk

## 2023-06-14 LAB — BASIC METABOLIC PANEL
BUN: 14 (ref 4–21)
Creatinine: 1.1 (ref 0.6–1.3)
Glucose: 79

## 2023-06-14 LAB — PROTEIN / CREATININE RATIO, URINE
Albumin, U: <0.3
Creatinine, Urine: 63.8

## 2023-06-14 LAB — HEMOGLOBIN A1C: Hemoglobin A1C: 6.5

## 2023-06-14 LAB — LIPID PANEL
LDL Cholesterol: 57
Triglycerides: 75 (ref 40–160)

## 2023-06-14 LAB — COMPREHENSIVE METABOLIC PANEL
Calcium: 10.1 (ref 8.7–10.7)
eGFR: 74

## 2023-07-13 ENCOUNTER — Ambulatory Visit: Payer: No Typology Code available for payment source | Admitting: Nurse Practitioner

## 2023-07-14 ENCOUNTER — Ambulatory Visit: Payer: Medicare HMO | Admitting: Internal Medicine

## 2023-08-03 ENCOUNTER — Ambulatory Visit: Payer: Medicare HMO | Admitting: Internal Medicine

## 2023-08-10 ENCOUNTER — Other Ambulatory Visit: Payer: Self-pay | Admitting: Nurse Practitioner

## 2023-08-24 ENCOUNTER — Ambulatory Visit: Payer: No Typology Code available for payment source | Admitting: Nurse Practitioner

## 2023-08-24 ENCOUNTER — Encounter: Payer: Self-pay | Admitting: Nurse Practitioner

## 2023-08-24 VITALS — BP 111/67 | HR 57 | Ht 73.0 in | Wt 171.2 lb

## 2023-08-24 DIAGNOSIS — E119 Type 2 diabetes mellitus without complications: Secondary | ICD-10-CM | POA: Diagnosis not present

## 2023-08-24 DIAGNOSIS — Z7984 Long term (current) use of oral hypoglycemic drugs: Secondary | ICD-10-CM

## 2023-08-24 MED ORDER — METFORMIN HCL 500 MG PO TABS: 500.0000 mg | ORAL_TABLET | Freq: Every day | ORAL | 1 refills | Status: AC

## 2023-08-24 NOTE — Progress Notes (Signed)
Endocrinology Follow Up Note       08/24/2023, 11:50 AM   Subjective:    Patient ID: Grant Cardenas, male    DOB: 12/17/1955.  Grant Cardenas is being seen in follow up after being seen in consultation for management of currently uncontrolled symptomatic diabetes requested by  Anabel Halon, MD.   Past Medical History:  Diagnosis Date   CAD (coronary artery disease)    a. anterior ST elevation MI in 09/2015. Cardiac cath showed occluded prox LAD and 60% ostial RCA stenosis. There was also very distal disease affecting the LAD and ramus. He underwent successful angioplasty and drug-eluting stent placement to the LAD. Ejection fraction was 35-40% which improved subsequently an echo to 55-60%.   Chronic obstructive pulmonary disease, unspecified (HCC)    COPD (chronic obstructive pulmonary disease) (HCC) 03/2016   4th stage-Dr. Juanetta Gosling   Essential (primary) hypertension    Hyperlipidemia    Ischemic cardiomyopathy    a. EF by cath 09/13/2015: 35-40%; b. 09/14/2015: EF 55-60%, Probable akinesis of  the midanteroseptal myocardium. akinesis of the apicalanterior myocardium, GR1DD   MI (myocardial infarction) (HCC)    Psoriasis    Psoriasis, unspecified    ST elevation (STEMI) myocardial infarction involving left anterior descending coronary artery (HCC)  09/2015 09/13/2015   Tobacco use    Vitreous floaters of both eyes     Past Surgical History:  Procedure Laterality Date   CARDIAC CATHETERIZATION N/A 09/13/2015   Procedure: Left Heart Cath and Coronary Angiography;  Surgeon: Iran Ouch, MD;  Location: [facility];  Service: Cardiovascular;  Laterality: N/A;   CARDIAC CATHETERIZATION N/A 09/13/2015   Procedure: Coronary Stent Intervention;  Surgeon: Iran Ouch, MD;  Location: [facility];  Service: Cardiovascular;  Laterality: N/A;   CARDIAC CATHETERIZATION N/A 11/03/2015    Procedure: Left Heart Cath and Coronary Angiography;  Surgeon: Peter M Swaziland, MD;  Location: [facility] INV[facility] CV [facility];  Service: Cardiovascular;  Laterality: N/A;   CATARACT EXTRACTION W/PHACO Left 04/14/2023   Procedure: CATARACT EXTRACTION PHACO AND INTRAOCULAR LENS PLACEMENT (IOC);  Surgeon: Pecolia Ades, MD;  Location: [facility];  Service: Ophthalmology;  Laterality: Left;  CDE 7.00   CATARACT EXTRACTION W/PHACO Right 04/28/2023   Procedure: CATARACT EXTRACTION PHACO AND INTRAOCULAR LENS PLACEMENT (IOC);  Surgeon: Pecolia Ades, MD;  Location: [facility];  Service: Ophthalmology;  Laterality: Right;  CDE 5.98   COLONOSCOPY N/A 01/29/2016   Procedure: COLONOSCOPY;  Surgeon: West Bali, MD;  Location: [facility];  Service: Endoscopy;  Laterality: N/A;  1030-moved to 1215 office to notify    COLONOSCOPY N/A 02/28/2020   Procedure: COLONOSCOPY;  Surgeon: Corbin Ade, MD;  Location: [facility];  Service: Endoscopy;  Laterality: N/A;  9:30   ESOPHAGOGASTRODUODENOSCOPY N/A 01/29/2016   Procedure: ESOPHAGOGASTRODUODENOSCOPY (EGD);  Surgeon: West Bali, MD;  Location: [facility];  Service: Endoscopy;  Laterality: N/A;   POLYPECTOMY  02/28/2020   Procedure: POLYPECTOMY;  Surgeon: Corbin Ade, MD;  Location: [facility];  Service: Endoscopy;;    Social History   Socioeconomic History   Marital status: Married    Spouse name: Not  on file   Number of children: Not on file   Years of education: Not on file   Highest education level: Not on file  Occupational History   Not on file  Tobacco Use   Smoking status: Former    Current packs/day: 0.00    Average packs/day: 0.5 packs/day for 40.0 years (20.0 ttl pk-yrs)    Types: Cigarettes    Start date: 09/14/1975    Quit date: 09/14/2015    Years since quitting: 7.9   Smokeless tobacco: Never  Vaping Use   Vaping status: Former  Substance and Sexual Activity   Alcohol use: No    Alcohol/week: 0.0 standard drinks of alcohol     Comment: Previously heavy beer drinker; quit 20 years ago   Drug use: No   Sexual activity: Never  Other Topics Concern   Not on file  Social History Narrative   Not on file   Social Determinants of Health   Financial Resource Strain: Low Risk  (07/05/2022)   Overall Financial Resource Strain (CARDIA)    Difficulty of Paying Living Expenses: Not hard at all  Food Insecurity: No Food Insecurity (07/05/2022)   Hunger Vital Sign    Worried About Running Out of Food in the Last Year: Never true    Ran Out of Food in the Last Year: Never true  Transportation Needs: No Transportation Needs (07/05/2022)   PRAPARE - Administrator, Civil Service (Medical): No    Lack of Transportation (Non-Medical): No  Physical Activity: Sufficiently Active (07/05/2022)   Exercise Vital Sign    Days of Exercise per Week: 5 days    Minutes of Exercise per Session: 30 min  Stress: No Stress Concern Present (05/03/2021)   [employer] of Occupational Health - Occupational Stress Questionnaire    Feeling of Stress : Not at all  Social Connections: Moderately Integrated (07/05/2022)   Social Connection and Isolation Panel [NHANES]    Frequency of Communication with Friends and Family: Once a week    Frequency of Social Gatherings with Friends and Family: Twice a week    Attends Religious Services: More than 4 times per year    Active Member of [employer] or Organizations: No    Attends Banker Meetings: Never    Marital Status: Married    Family History  Problem Relation Age of Onset   Heart Problems Mother    Heart attack Father    Heart disease Father    Cancer Other    Colon cancer Maternal Aunt     Outpatient Encounter Medications as of 08/24/2023  Medication Sig   Albuterol Sulfate 108 (90 Base) MCG/ACT AEPB Inhale 1-2 puffs into the lungs 4 (four) times daily as needed (wheezing/shortness of breath.).    aspirin EC 81 MG tablet Take 1 tablet (81 mg total) by mouth daily.    atorvastatin (LIPITOR) 40 MG tablet TAKE ONE TABLET BY MOUTH AT BEDTIME FOR CHOLESTEROL   Calcium Carb-Cholecalciferol 600-10 MG-MCG TABS Take 1 tablet by mouth 2 (two) times daily.   calcium carbonate (OS-CAL) 1250 (500 Ca) MG chewable tablet CHEW 1-2 TABLETS BY MOUTH AT BEDTIME AS NEEDED FOR INDIGESTION   Carboxymethylcellulose Sod PF 0.25 % SOLN Place 1 drop into both eyes 4 (four) times daily as needed (dry/irritated eyes.).    carvedilol (COREG) 3.125 MG tablet Take 1 tablet (3.125 mg total) by mouth 2 (two) times daily with a meal.   Cholecalciferol 25 MCG (1000 UT) tablet Take  1 tablet by mouth daily.   clobetasol ointment (TEMOVATE) 0.05 % Apply 1 application topically 2 (two) times daily as needed (psoriasis).    DULoxetine (CYMBALTA) 30 MG capsule Take 30 mg by mouth daily.   ezetimibe (ZETIA) 10 MG tablet Take 1 tablet by mouth daily.   fluticasone (CUTIVATE) 0.05 % cream Apply 1 application topically 2 (two) times daily as needed (psoriasis (face & groin areas)).    ibuprofen (ADVIL) 600 MG tablet Take 1 tablet (600 mg total) by mouth every 8 (eight) hours as needed.   ketoconazole (NIZORAL) 2 % shampoo Apply 1 application topically 2 (two) times a week. Applied to facial hair (beard) & scalp. Leave on 10 minutes prior to washing out.   losartan (COZAAR) 50 MG tablet Take 50 mg by mouth 2 (two) times daily.   Menthol-Methyl Salicylate (THERA-GESIC) 0.5-15 % CREA APPLY MODERATE AMOUNT TO AFFECTED AREA TWICE A DAY   nitroGLYCERIN (NITROSTAT) 0.4 MG SL tablet Place 1 tablet (0.4 mg total) under the tongue every 5 (five) minutes as needed for chest pain.   pantoprazole (PROTONIX) 40 MG tablet Take 40 mg by mouth daily.   umeclidinium-vilanterol (ANORO ELLIPTA) 62.5-25 MCG/INH AEPB Inhale 1 puff into the lungs daily.   Ustekinumab (STELARA Micro) Inject 1 Dose into the skin every 3 (three) months. Every 12 weeks   [DISCONTINUED] metFORMIN (GLUCOPHAGE) 500 MG tablet Take 1 tablet (500 mg  total) by mouth daily with breakfast.   metFORMIN (GLUCOPHAGE) 500 MG tablet Take 1 tablet (500 mg total) by mouth daily with breakfast.   No facility-administered encounter medications on file as of 08/24/2023.    ALLERGIES: Allergies  Allergen Reactions   Sulfa Antibiotics Other (See Comments)    Sulfa drugs (unaware of this reaction per patient spouse)      VACCINATION STATUS: Immunization History  Administered Date(s) Administered   Fluad Quad(high Dose 65+) 07/03/2020   Influenza Inj Mdck Quad Pf 08/11/2016   Influenza, High Dose Seasonal PF 09/05/2016   Influenza, Seasonal, Injecte, Preservative Fre 06/18/2019   Influenza,inj,Quad PF,6+ Mos 07/07/2017, 07/02/2018, 06/18/2019   Influenza-Unspecified 06/18/2019, 06/21/2022   Moderna Covid-19 Fall Seasonal Vaccine 16yrs & older 08/04/2022, 07/19/2023   Moderna Sars-Covid-2 Vaccination 11/07/2019, 12/05/2019, 08/04/2020, 01/12/2021   Pneumococcal Conjugate-13 06/07/2016   Pneumococcal Polysaccharide-23 07/07/2017   Td (Adult) 06/07/2016   Tdap 11/03/2004   Zoster Recombinant(Shingrix) 10/04/2019, 03/20/2020    Diabetes He presents for his follow-up diabetic visit. He has type 2 diabetes mellitus. Onset time: Diagnosed at approx age of 69. His disease course has been stable. There are no hypoglycemic associated symptoms. (He did not have any symptoms of hypoglycemia, despite glucose reading of 21. ) Pertinent negatives for diabetes include no blurred vision, no fatigue, no polydipsia, no polyuria and no weight loss. There are no hypoglycemic complications. Symptoms are improving. Diabetic complications include heart disease and nephropathy. Risk factors for coronary artery disease include diabetes mellitus, dyslipidemia, hypertension and male sex. Current diabetic treatment includes diet and oral agent (monotherapy). He is compliant with treatment most of the time. His weight is fluctuating minimally. He is following a generally  healthy diet. When asked about meal planning, he reported none. He has not had a previous visit with a dietitian. He participates in exercise intermittently. (He presents today, accompanied by his wife, with no meter or logs to review.  He was not asked to routinely monitor glucose due to monotherapy with Metformin only.  His POCT A1c today is 6.5%, increasing from last  visit of 5.6%.  He notes he has been eating more fast food recently due to kitchen issues and being unable to cook at home.) An ACE inhibitor/angiotensin II receptor blocker is being taken. He does not see a podiatrist.Eye exam is current.  Hyperlipidemia This is a chronic problem. The current episode started more than 1 year ago. The problem is controlled. Recent lipid tests were reviewed and are normal. Exacerbating diseases include diabetes. Factors aggravating his hyperlipidemia include beta blockers. Current antihyperlipidemic treatment includes statins. The current treatment provides significant improvement of lipids. There are no compliance problems.  Risk factors for coronary artery disease include diabetes mellitus, dyslipidemia, hypertension and male sex.  Hypertension This is a chronic problem. The current episode started more than 1 year ago. The problem has been resolved since onset. The problem is controlled. Pertinent negatives include no blurred vision. There are no associated agents to hypertension. Risk factors for coronary artery disease include diabetes mellitus, dyslipidemia and male gender. Past treatments include angiotensin blockers and beta blockers. The current treatment provides moderate improvement. There are no compliance problems.  Hypertensive end-organ damage includes CAD/MI.    Review of systems  Constitutional: + stable body weight,  current Body mass index is 22.59 kg/m. , + fatigue, no subjective hyperthermia, no subjective hypothermia Eyes: + blurry vision (recently diagnosed with cataracts), no  xerophthalmia ENT: no sore throat, no nodules palpated in throat, no dysphagia/odynophagia, no hoarseness Cardiovascular: no chest pain, no shortness of breath, no palpitations, no leg swelling Respiratory: no cough, no shortness of breath Gastrointestinal: no nausea/vomiting/diarrhea Musculoskeletal: + chronic muscle/joint aches (hx of RA) Skin: no rashes, no hyperemia Neurological: no tremors, no numbness, no tingling, no dizziness Psychiatric: no depression, no anxiety  Objective:     BP 111/67 (BP Location: Left Arm, Patient Position: Sitting, Cuff Size: Large)   Pulse (!) 57   Ht 6\' 1"  (1.854 m)   Wt 171 lb 3.2 oz (77.7 kg)   BMI 22.59 kg/m   Wt Readings from Last 3 Encounters:  08/24/23 171 lb 3.2 oz (77.7 kg)  04/14/23 173 lb 15.1 oz (78.9 kg)  04/11/23 174 lb (78.9 kg)     BP Readings from Last 3 Encounters:  08/24/23 111/67  04/28/23 125/77  04/14/23 122/80     Physical Exam- Limited  Constitutional:  Body mass index is 22.59 kg/m. , not in acute distress, anxious state of mind Eyes:  EOMI, no exophthalmos Musculoskeletal: no gross deformities, strength intact in all four extremities, no gross restriction of joint movements Skin:  no rashes, no hyperemia Neurological: no tremor with outstretched hands   Diabetic Foot Exam - Simple   Simple Foot Form Visual Inspection No deformities, no ulcerations, no other skin breakdown bilaterally: Yes Sensation Testing Intact to touch and monofilament testing bilaterally: Yes Pulse Check Posterior Tibialis and Dorsalis pulse intact bilaterally: Yes Comments     CMP ( most recent) CMP     Component Value Date/Time   NA 138 08/22/2022 1156   K 4.8 08/22/2022 1156   CL 103 08/22/2022 1156   CO2 21 08/22/2022 1156   GLUCOSE 142 (H) 08/22/2022 1156   GLUCOSE 448 (H) 11/28/2020 1328   BUN 14 06/14/2023 0000   CREATININE 1.1 06/14/2023 0000   CREATININE 1.27 08/22/2022 1156   CALCIUM 10.1 06/14/2023 0000   PROT  6.9 08/22/2022 1156   ALBUMIN 4.6 08/22/2022 1156   AST 26 08/22/2022 1156   ALT 33 08/22/2022 1156   ALKPHOS 50 08/22/2022  1156   BILITOT 1.3 (H) 08/22/2022 1156   GFRNONAA >60 11/28/2020 1328   GFRAA 81 06/24/2021 0000     Diabetic Labs (most recent): Lab Results  Component Value Date   HGBA1C 6.5 06/14/2023   HGBA1C 5.6 03/13/2023   HGBA1C 6.3 (A) 11/30/2022   MICROALBUR 10 mg/L 11/30/2022   MICROALBUR 30 12/23/2021   MICROALBUR 10 08/16/2021     Lipid Panel ( most recent) Lipid Panel     Component Value Date/Time   CHOL 86 12/03/2020 0000   CHOL 212 (H) 09/23/2015 1540   TRIG 75 06/14/2023 0000   HDL 43 11/08/2018 0000   HDL 41 09/23/2015 1540   CHOLHDL 3.7 11/26/2015 0815   VLDL 17 11/26/2015 0815   LDLCALC 57 06/14/2023 0000   LDLCALC 137 (H) 09/23/2015 1540   LABVLDL 34 09/23/2015 1540      Lab Results  Component Value Date   TSH 2.60 12/23/2021   TSH 2.69 06/24/2021   TSH 2.61 11/08/2018           Assessment & Plan:   1) Controlled Type 2 Diabetes without complications  He presents today, accompanied by his wife, with no meter or logs to review.  He was not asked to routinely monitor glucose due to monotherapy with Metformin only.  His POCT A1c today is 6.5%, increasing from last visit of 5.6%.  He notes he has been eating more fast food recently due to kitchen issues and being unable to cook at home.  - Grant Cardenas has currently uncontrolled symptomatic type 2 DM since 67 years of age.   -Recent labs reviewed.  - I had a long discussion with him about the progressive nature of diabetes and the pathology behind its complications. -his diabetes is complicated by CAD (history of MI) and he remains at a high risk for more acute and chronic complications which include CVA, CKD, retinopathy, and neuropathy. These are all discussed in detail with him.  - Nutritional counseling repeated at each appointment due to patients tendency to fall back in  to old habits.  - The patient admits there is a room for improvement in their diet and drink choices. -  Suggestion is made for the patient to avoid simple carbohydrates from their diet including Cakes, Sweet Desserts / Pastries, Ice Cream, Soda (diet and regular), Sweet Tea, Candies, Chips, Cookies, Sweet Pastries, Store Bought Juices, Alcohol in Excess of 1-2 drinks a day, Artificial Sweeteners, Coffee Creamer, and "Sugar-free" Products. This will help patient to have stable blood glucose profile and potentially avoid unintended weight gain.   - I encouraged the patient to switch to unprocessed or minimally processed complex starch and increased protein intake (animal or plant source), fruits, and vegetables.   - Patient is advised to stick to a routine mealtimes to eat 3 meals a day and avoid unnecessary snacks (to snack only to correct hypoglycemia).  - I have approached him with the following individualized plan to manage  his diabetes and patient agrees:   -Given stable glycemic profile, he is advised to continue Metformin 500 mg po once daily after breakfast.  May look to discontinue at next visit.   He is advised to check glucose as needed.  - he is not a good candidate for incretin therapy due to body habitus.  - Specific targets for  A1c;  LDL, HDL,  and Triglycerides were discussed with the patient.  2) Blood Pressure /Hypertension:  his blood pressure is controlled to  target.   he is advised to continue his current medications including Carvedilol 3.125 mg po twice daily and Losartan 50 mg p.o. daily with breakfast.  3) Lipids/Hyperlipidemia:    Review of his recent lipid panel from 12/23/21 showed controlled  LDL at 40 .  he  is advised to continue Lipitor 40 mg po daily at bedtime and Zetia 10 mg daily at bedtime.  Side effects and precautions discussed with him.  He does have elevated LFTs, which started soon after treatment for his psoriasis, he does follow with GI.  He does NOT  drink alcohol.  Has appt with PCP coming up in October for physical.  4)  Weight/Diet:  his Body mass index is 22.59 kg/m.  -  he is not a candidate for weight loss. Exercise, and detailed carbohydrates information provided  -  detailed on discharge instructions.  5) Chronic Care/Health Maintenance: -he is on ACEI/ARB and Statin medications and is encouraged to initiate and continue to follow up with Ophthalmology, Dentist, Podiatrist at least yearly or according to recommendations, and advised to stay away from smoking. I have recommended yearly flu vaccine and pneumonia vaccine at least every 5 years; moderate intensity exercise for up to 150 minutes weekly; and sleep for at least 7 hours a day.  - he is advised to maintain close follow up with Anabel Halon, MD for primary care needs, as well as his other providers for optimal and coordinated care.      I spent  35  minutes in the care of the patient today including review of labs from CMP, Lipids, Thyroid Function, Hematology (current and previous including abstractions from other facilities); face-to-face time discussing  his blood glucose readings/logs, discussing hypoglycemia and hyperglycemia episodes and symptoms, medications doses, his options of short and long term treatment based on the latest standards of care / guidelines;  discussion about incorporating lifestyle medicine;  and documenting the encounter. Risk reduction counseling performed per USPSTF guidelines to reduce obesity and cardiovascular risk factors.     Please refer to Patient Instructions for Blood Glucose Monitoring and Insulin/Medications Dosing Guide"  in media tab for additional information. Please  also refer to " Patient Self Inventory" in the Media  tab for reviewed elements of pertinent patient history.  Grant Cardenas participated in the discussions, expressed understanding, and voiced agreement with the above plans.  All questions were answered to his  satisfaction. he is encouraged to contact clinic should he have any questions or concerns prior to his return visit.   Follow up plan: - Return in about 4 months (around 12/22/2023) for Diabetes F/U with A1c in office, No previsit labs.  Ronny Bacon, [facility] [address] [place], [place] [address] Phone: [phone] Fax: [phone]  08/24/2023, 11:50 AM

## 2023-10-02 ENCOUNTER — Encounter: Payer: Self-pay | Admitting: Internal Medicine

## 2023-10-03 ENCOUNTER — Ambulatory Visit: Payer: Medicare HMO | Admitting: Internal Medicine

## 2023-11-08 ENCOUNTER — Encounter: Payer: Self-pay | Admitting: Internal Medicine

## 2023-11-08 NOTE — Telephone Encounter (Signed)
 Spoke with pt on the phone put her on for a video visit with PCP on 02/05.

## 2023-12-12 LAB — LIPID PANEL
Cholesterol: 117 (ref 0–200)
HDL: 42 (ref 35–70)
LDL Cholesterol: 58
Triglycerides: 71 (ref 40–160)

## 2023-12-12 LAB — HEMOGLOBIN A1C
Creatinine, POC: 117.2 mg/dL
EGFR (Non-African Amer.): 73
Hemoglobin A1C: 6.3
Microalb Creat Ratio: 2.6
Microalbumin, Urine: 0.3

## 2023-12-12 LAB — MICROALBUMIN, URINE: Microalb, Ur: 0.3

## 2023-12-12 LAB — BASIC METABOLIC PANEL: Creatinine: 1.1 (ref 0.6–1.3)

## 2023-12-12 LAB — COMPREHENSIVE METABOLIC PANEL: eGFR: 73

## 2023-12-12 LAB — PROTEIN / CREATININE RATIO, URINE: Creatinine, Urine: 117.2

## 2023-12-12 LAB — MICROALBUMIN / CREATININE URINE RATIO: Microalb Creat Ratio: 2.6

## 2023-12-12 LAB — VITAMIN D 25 HYDROXY (VIT D DEFICIENCY, FRACTURES): Vit D, 25-Hydroxy: 50.8

## 2023-12-19 ENCOUNTER — Encounter: Payer: Self-pay | Admitting: Internal Medicine

## 2023-12-19 ENCOUNTER — Ambulatory Visit: Payer: Medicare HMO | Admitting: Internal Medicine

## 2023-12-19 VITALS — BP 106/63 | HR 60 | Ht 73.0 in | Wt 172.2 lb

## 2023-12-19 DIAGNOSIS — I7143 Infrarenal abdominal aortic aneurysm, without rupture: Secondary | ICD-10-CM

## 2023-12-19 DIAGNOSIS — J449 Chronic obstructive pulmonary disease, unspecified: Secondary | ICD-10-CM

## 2023-12-19 DIAGNOSIS — Z23 Encounter for immunization: Secondary | ICD-10-CM | POA: Diagnosis not present

## 2023-12-19 DIAGNOSIS — I25119 Atherosclerotic heart disease of native coronary artery with unspecified angina pectoris: Secondary | ICD-10-CM | POA: Diagnosis not present

## 2023-12-19 DIAGNOSIS — Z7984 Long term (current) use of oral hypoglycemic drugs: Secondary | ICD-10-CM

## 2023-12-19 DIAGNOSIS — G250 Essential tremor: Secondary | ICD-10-CM

## 2023-12-19 DIAGNOSIS — L409 Psoriasis, unspecified: Secondary | ICD-10-CM

## 2023-12-19 DIAGNOSIS — M353 Polymyalgia rheumatica: Secondary | ICD-10-CM | POA: Diagnosis not present

## 2023-12-19 DIAGNOSIS — I10 Essential (primary) hypertension: Secondary | ICD-10-CM

## 2023-12-19 DIAGNOSIS — E1169 Type 2 diabetes mellitus with other specified complication: Secondary | ICD-10-CM | POA: Diagnosis not present

## 2023-12-19 NOTE — Assessment & Plan Note (Signed)
Well-controlled currently with Anoro Ellipta and PRN Albuterol Quit smoking in 2016

## 2023-12-19 NOTE — Assessment & Plan Note (Signed)
 Has seen Rheumatologist at [facility] Was given oral steroids, but had uncontrolled DM with it Ibuprofen as needed for severe joint pain Can take Tylenol on an alternating basis Robaxin for muscle spasms

## 2023-12-19 NOTE — Assessment & Plan Note (Signed)
S/p stent placement for STEMI in 2016 ?On Aspirin, Coreg and statin ?Followed by Cardiology ?

## 2023-12-19 NOTE — Assessment & Plan Note (Signed)
 He has essential tremor, needs to cut down caffeinated products - takes about 5-6 cups of coffee in a day

## 2023-12-19 NOTE — Assessment & Plan Note (Signed)
On Stelara, follows up with Dermatology 

## 2023-12-19 NOTE — Patient Instructions (Addendum)
 You were given pneumococcal-20 vaccine today.  Please continue to take medications as prescribed.  Please continue to follow low salt diet and ambulate as tolerated.

## 2023-12-19 NOTE — Assessment & Plan Note (Addendum)
 Lab Results  Component Value Date   HGBA1C 6.3 12/12/2023   Well controlled Associated with HTN, HLD and CAD [facility] blood tests reviewed and discussed with the patient On Synjardy 07-999 mg QD Advised to follow diabetic diet On statin and ARB F/u CMP and lipid panel Diabetic eye exam: Advised to follow up with Ophthalmology for diabetic eye exam

## 2023-12-19 NOTE — Progress Notes (Signed)
 Established Patient Office Visit  Subjective:  Patient ID: Grant Cardenas, male    DOB: 02/19/1956  Age: 68 y.o. MRN: [id number]  CC:  Chief Complaint  Patient presents with   Care Management    6 month f/u    HPI Grant Cardenas is a 68 y.o. male with past medical history of CAD s/p stent placement in 2016, HTN, COPD, type 2 DM, psoriasis, polymyalgia rheumatica, and HLD who presents for f/u of his chronic medical conditions.  He was diagnosed with polymyalgia rheumatica in 2023 and was placed on oral prednisone by [facility] Rheumatology, but his blood glucose was running very high, and his HbA1c had been up to 11.1 in 09/23 due to oral steroid.  He had to stop taking prednisone due to it and is currently taking Tylenol and ibuprofen on an alternating basis.  He has acute flareup of shoulder, hip and and MCP joints of hands at times. He has been evaluated by Pain specialist for it as well.  He follows up with Cardiologist at [facility] for his h/o CAD. Denies any chest pain or dyspnea currently.  BP is well-controlled. Takes medications regularly. Patient denies headache, dizziness, chest pain, dyspnea or palpitations.   He still takes metformin 500 mg BID for type II DM.  His last HbA1c was 6.3 in 03/25.  Denies any fatigue, polyuria or polydipsia currently.  He uses Anoro inhaler regularly and COPD is well-controlled. Quit smoking in 2016. Denies any recent episode of COPD exacerbation.   He is on Stelara for Psoriasis. He follows up with Dermatology.  Past Medical History:  Diagnosis Date   CAD (coronary artery disease)    a. anterior ST elevation MI in 09/2015. Cardiac cath showed occluded prox LAD and 60% ostial RCA stenosis. There was also very distal disease affecting the LAD and ramus. He underwent successful angioplasty and drug-eluting stent placement to the LAD. Ejection fraction was 35-40% which improved subsequently an echo to 55-60%.   Chronic obstructive pulmonary disease,  unspecified (HCC)    COPD (chronic obstructive pulmonary disease) (HCC) 03/2016   4th stage-Dr. Juanetta Gosling   Essential (primary) hypertension    Hyperlipidemia    Ischemic cardiomyopathy    a. EF by cath 09/13/2015: 35-40%; b. 09/14/2015: EF 55-60%, Probable akinesis of  the midanteroseptal myocardium. akinesis of the apicalanterior myocardium, [id number]   MI (myocardial infarction) (HCC)    Psoriasis    Psoriasis, unspecified    ST elevation (STEMI) myocardial infarction involving left anterior descending coronary artery (HCC)  09/2015 09/13/2015   Tobacco use    Vitreous floaters of both eyes     Past Surgical History:  Procedure Laterality Date   CARDIAC CATHETERIZATION N/A 09/13/2015   Procedure: Left Heart Cath and Coronary Angiography;  Surgeon: Iran Ouch, MD;  Location: [facility] INVASIVE CV LAB;  Service: Cardiovascular;  Laterality: N/A;   CARDIAC CATHETERIZATION N/A 09/13/2015   Procedure: Coronary Stent Intervention;  Surgeon: Iran Ouch, MD;  Location: [facility] INVASIVE CV LAB;  Service: Cardiovascular;  Laterality: N/A;   CARDIAC CATHETERIZATION N/A 11/03/2015   Procedure: Left Heart Cath and Coronary Angiography;  Surgeon: Peter M Swaziland, MD;  Location: [facility] INVASIVE CV LAB;  Service: Cardiovascular;  Laterality: N/A;   CATARACT EXTRACTION W/PHACO Left 04/14/2023   Procedure: CATARACT EXTRACTION PHACO AND INTRAOCULAR LENS PLACEMENT (IOC);  Surgeon: Pecolia Ades, MD;  Location: [facility] ORS;  Service: Ophthalmology;  Laterality: Left;  CDE 7.00   CATARACT EXTRACTION W/PHACO Right 04/28/2023  Procedure: CATARACT EXTRACTION PHACO AND INTRAOCULAR LENS PLACEMENT (IOC);  Surgeon: Pecolia Ades, MD;  Location: [facility] ORS;  Service: Ophthalmology;  Laterality: Right;  CDE 5.98   COLONOSCOPY N/A 01/29/2016   Procedure: COLONOSCOPY;  Surgeon: West Bali, MD;  Location: [facility] ENDO SUITE;  Service: Endoscopy;  Laterality: N/A;  1030-moved to 1215 office to notify    COLONOSCOPY N/A 02/28/2020    Procedure: COLONOSCOPY;  Surgeon: Corbin Ade, MD;  Location: [facility] END[facility] SUITE;  Service: Endoscopy;  Laterality: N/A;  9:30   ESOPHAGOGASTRODUODENOSCOPY N/A 01/29/2016   Procedure: ESOPHAGOGASTRODUODENOSCOPY (EGD);  Surgeon: West Bali, MD;  Location: [facility];  Service: Endoscopy;  Laterality: N/A;   POLYPECTOMY  02/28/2020   Procedure: POLYPECTOMY;  Surgeon: Corbin Ade, MD;  Location: [facility] END[facility];  Service: Endoscopy;;    Family History  Problem Relation Age of Onset   Heart Problems Mother    Heart attack Father    Heart disease Father    Cancer Other    Colon cancer Maternal Aunt     Social History   Socioeconomic History   Marital status: Married    Spouse name: Not on file   Number of children: Not on file   Years of education: Not on file   Highest education level: Not on file  Occupational History   Not on file  Tobacco Use   Smoking status: Former    Current packs/day: 0.00    Average packs/day: 0.5 packs/day for 40.0 years (20.0 ttl pk-yrs)    Types: Cigarettes    Start date: 09/14/1975    Quit date: 09/14/2015    Years since quitting: 8.2   Smokeless tobacco: Never  Vaping Use   Vaping status: Former  Substance and Sexual Activity   Alcohol use: No    Alcohol/week: 0.0 standard drinks of alcohol    Comment: Previously heavy beer drinker; quit 20 years ago   Drug use: No   Sexual activity: Never  Other Topics Concern   Not on file  Social History Narrative   Not on file   Social Drivers of Health   Financial Resource Strain: Low Risk  (07/05/2022)   Overall Financial Resource Strain (CARDIA)    Difficulty of Paying Living Expenses: Not hard at all  Food Insecurity: No Food Insecurity (07/05/2022)   Hunger Vital Sign    Worried About Running Out of Food in the Last Year: Never true    Ran Out of Food in the Last Year: Never true  Transportation Needs: No Transportation Needs (07/05/2022)   PRAPARE - Scientist, research (physical sciences) (Medical): No    Lack of Transportation (Non-Medical): No  Physical Activity: Sufficiently Active (07/05/2022)   Exercise Vital Sign    Days of Exercise per Week: 5 days    Minutes of Exercise per Session: 30 min  Stress: No Stress Concern Present (05/03/2021)   [employer] of Occupational Health - Occupational Stress Questionnaire    Feeling of Stress : Not at all  Social Connections: Moderately Integrated (07/05/2022)   Social Connection and Isolation Panel [NHANES]    Frequency of Communication with Friends and Family: Once a week    Frequency of Social Gatherings with Friends and Family: Twice a week    Attends Religious Services: More than 4 times per year    Active Member of [employer] or Organizations: No    Attends Banker Meetings: Never    Marital Status: Married  Intimate  Partner Violence: Not At Risk (05/03/2021)   Humiliation, Afraid, Rape, and Kick questionnaire    Fear of Current or Ex-Partner: No    Emotionally Abused: No    Physically Abused: No    Sexually Abused: No    Outpatient Medications Prior to Visit  Medication Sig Dispense Refill   Albuterol Sulfate 108 (90 Base) MCG/ACT AEPB Inhale 1-2 puffs into the lungs 4 (four) times daily as needed (wheezing/shortness of breath.).      aspirin EC 81 MG tablet Take 1 tablet (81 mg total) by mouth daily. 90 tablet 3   atorvastatin (LIPITOR) 40 MG tablet TAKE ONE TABLET BY MOUTH AT BEDTIME FOR CHOLESTEROL     Calcium Carb-Cholecalciferol 600-10 MG-MCG TABS Take 1 tablet by mouth 2 (two) times daily.     calcium carbonate (OS-CAL) 1250 (500 Ca) MG chewable tablet CHEW 1-2 TABLETS BY MOUTH AT BEDTIME AS NEEDED FOR INDIGESTION     Carboxymethylcellulose Sod PF 0.25 % SOLN Place 1 drop into both eyes 4 (four) times daily as needed (dry/irritated eyes.).      carvedilol (COREG) 3.125 MG tablet Take 1 tablet (3.125 mg total) by mouth 2 (two) times daily with a meal. 180 tablet 3   Cholecalciferol 25 MCG  (1000 UT) tablet Take 1 tablet by mouth daily.     clobetasol ointment (TEMOVATE) 0.05 % Apply 1 application topically 2 (two) times daily as needed (psoriasis).   1   DULoxetine (CYMBALTA) 30 MG capsule Take 30 mg by mouth daily.     Empagliflozin-metFORMIN HCl ER 07-999 MG TB24 Take by mouth.     ezetimibe (ZETIA) 10 MG tablet Take 1 tablet by mouth daily.     fluticasone (CUTIVATE) 0.05 % cream Apply 1 application topically 2 (two) times daily as needed (psoriasis (face & groin areas)).      ibuprofen (ADVIL) 600 MG tablet Take 1 tablet (600 mg total) by mouth every 8 (eight) hours as needed. 30 tablet 1   ketoconazole (NIZORAL) 2 % shampoo Apply 1 application topically 2 (two) times a week. Applied to facial hair (beard) & scalp. Leave on 10 minutes prior to washing out.     losartan (COZAAR) 50 MG tablet Take 50 mg by mouth 2 (two) times daily.     Menthol-Methyl Salicylate (THERA-GESIC) 0.5-15 % CREA APPLY MODERATE AMOUNT TO AFFECTED AREA TWICE A DAY     metFORMIN (GLUCOPHAGE) 500 MG tablet Take 1 tablet (500 mg total) by mouth daily with breakfast. 90 tablet 1   methocarbamol (ROBAXIN) 500 MG tablet Take 500 mg by mouth.     nitroGLYCERIN (NITROSTAT) 0.4 MG SL tablet Place 1 tablet (0.4 mg total) under the tongue every 5 (five) minutes as needed for chest pain. 25 tablet 12   pantoprazole (PROTONIX) 40 MG tablet Take 40 mg by mouth daily.     umeclidinium-vilanterol (ANORO ELLIPTA) 62.5-25 MCG/INH AEPB Inhale 1 puff into the lungs daily.     Ustekinumab (STELARA McGregor) Inject 1 Dose into the skin every 3 (three) months. Every 12 weeks     No facility-administered medications prior to visit.    Allergies  Allergen Reactions   Sulfa Antibiotics Other (See Comments)    Sulfa drugs (unaware of this reaction per patient spouse)      ROS Review of Systems  Constitutional:  Negative for chills and fever.  HENT:  Negative for congestion and sore throat.   Eyes:  Negative for pain and  discharge.  Respiratory:  Negative  for cough and shortness of breath.   Cardiovascular:  Negative for chest pain and palpitations.  Gastrointestinal:  Negative for diarrhea, nausea and vomiting.  Endocrine: Negative for polydipsia and polyuria.  Genitourinary:  Negative for dysuria and hematuria.  Musculoskeletal:  Positive for arthralgias and back pain. Negative for neck pain and neck stiffness.  Skin:  Negative for rash.  Neurological:  Negative for dizziness, weakness, numbness and headaches.  Psychiatric/Behavioral:  Negative for agitation and behavioral problems.       Objective:    Physical Exam Vitals reviewed.  Constitutional:      General: He is not in acute distress.    Appearance: He is not diaphoretic.  HENT:     Head: Normocephalic and atraumatic.     Nose: Nose normal.     Mouth/Throat:     Mouth: Mucous membranes are moist.  Eyes:     General: No scleral icterus.    Extraocular Movements: Extraocular movements intact.  Cardiovascular:     Rate and Rhythm: Normal rate and regular rhythm.     Heart sounds: Normal heart sounds. No murmur heard. Pulmonary:     Breath sounds: Normal breath sounds. No wheezing or rales.  Abdominal:     Palpations: Abdomen is soft.     Tenderness: There is no abdominal tenderness.  Musculoskeletal:     Cervical back: Neck supple. No tenderness.     Right lower leg: No edema.     Left lower leg: No edema.  Skin:    General: Skin is warm.     Findings: No rash.  Neurological:     General: No focal deficit present.     Mental Status: He is alert and oriented to person, place, and time.     Cranial Nerves: No cranial nerve deficit.     Sensory: No sensory deficit.     Motor: Tremor (Hand tremors) present. No weakness.  Psychiatric:        Mood and Affect: Mood normal.        Behavior: Behavior normal.     BP 106/63   Pulse 60   Ht 6\' 1"  (1.854 m)   Wt 172 lb 3.2 oz (78.1 kg)   SpO2 95%   BMI 22.72 kg/m  Wt Readings  from Last 3 Encounters:  12/19/23 172 lb 3.2 oz (78.1 kg)  08/24/23 171 lb 3.2 oz (77.7 kg)  04/14/23 173 lb 15.1 oz (78.9 kg)    Lab Results  Component Value Date   TSH 2.60 12/23/2021   Lab Results  Component Value Date   WBC 5.5 11/28/2020   HGB 15.0 12/03/2020   HCT 43 12/03/2020   MCV 86.1 11/28/2020   PLT 235 11/28/2020   Lab Results  Component Value Date   NA 138 08/22/2022   K 4.8 08/22/2022   CO2 21 08/22/2022   GLUCOSE 142 (H) 08/22/2022   BUN 14 06/14/2023   CREATININE 1.1 12/12/2023   BILITOT 1.3 (H) 08/22/2022   ALKPHOS 50 08/22/2022   AST 26 08/22/2022   ALT 33 08/22/2022   PROT 6.9 08/22/2022   ALBUMIN 4.6 08/22/2022   CALCIUM 10.1 06/14/2023   ANIONGAP 12 11/28/2020   EGFR 73 12/12/2023   Lab Results  Component Value Date   CHOL 117 12/12/2023   Lab Results  Component Value Date   HDL 42 12/12/2023   Lab Results  Component Value Date   LDLCALC 58 12/12/2023   Lab Results  Component Value Date  TRIG 71 12/12/2023   Lab Results  Component Value Date   CHOLHDL 3.7 11/26/2015   Lab Results  Component Value Date   HGBA1C 6.3 12/12/2023      Assessment & Plan:   Problem List Items Addressed This Visit       Cardiovascular and Mediastinum   Coronary artery disease involving native coronary artery with angina pectoris (HCC)   S/p stent placement for STEMI in 2016 On Aspirin, Coreg and statin Followed by Cardiology      Essential (primary) hypertension   BP Readings from Last 1 Encounters:  12/19/23 106/63   Well-controlled with Coreg and Losartan Counseled for compliance with the medications Advised DASH diet and moderate exercise/walking, at least 150 mins/week      Infrarenal abdominal aortic aneurysm (AAA) without rupture (HCC)   US abdomen in 2025 showed stable sized 3.5 cm aortic aneurysm, he is a former smoker Check US abdomen after 2 years        Respiratory   Chronic obstructive lung disease (HCC)    Well-controlled currently with Anoro Ellipta and PRN Albuterol Quit smoking in 2016        Endocrine   Type 2 diabetes mellitus with other specified complication (HCC) - Primary   Lab Results  Component Value Date   HGBA1C 6.3 12/12/2023   Well controlled Associated with HTN, HLD and CAD [facility] blood tests reviewed and discussed with the patient On Synjardy 07-999 mg QD Advised to follow diabetic diet On statin and ARB F/u CMP and lipid panel Diabetic eye exam: Advised to follow up with Ophthalmology for diabetic eye exam      Relevant Medications   Empagliflozin-metFORMIN HCl ER 07-999 MG TB24     Nervous and Auditory   Essential tremor   He has essential tremor, needs to cut down caffeinated products - takes about 5-6 cups of coffee in a day        Musculoskeletal and Integument   Psoriasis   On Stelara, follows up with Dermatology        Other   Polymyalgia rheumatica (HCC)   Has seen Rheumatologist at [facility] Was given oral steroids, but had uncontrolled DM with it Ibuprofen as needed for severe joint pain Can take Tylenol on an alternating basis Robaxin for muscle spasms      Other Visit Diagnoses       Encounter for immunization       Relevant Orders   Pneumococcal conjugate vaccine 20-valent (Completed)        No orders of the defined types were placed in this encounter.   Follow-up: Return in about 6 months (around 06/20/2024) for HTN and DM.    Anabel Halon, MD

## 2023-12-19 NOTE — Assessment & Plan Note (Signed)
 US abdomen in 2025 showed stable sized 3.5 cm aortic aneurysm, he is a former smoker Check US abdomen after 2 years

## 2023-12-19 NOTE — Assessment & Plan Note (Signed)
 BP Readings from Last 1 Encounters:  12/19/23 106/63   Well-controlled with Coreg and Losartan Counseled for compliance with the medications Advised DASH diet and moderate exercise/walking, at least 150 mins/week

## 2023-12-20 ENCOUNTER — Other Ambulatory Visit (HOSPITAL_COMMUNITY): Payer: Self-pay | Admitting: Physician Assistant

## 2023-12-20 DIAGNOSIS — R748 Abnormal levels of other serum enzymes: Secondary | ICD-10-CM

## 2023-12-22 ENCOUNTER — Ambulatory Visit: Payer: Medicare HMO | Admitting: Nurse Practitioner

## 2024-01-08 ENCOUNTER — Ambulatory Visit (HOSPITAL_COMMUNITY)
Admission: RE | Admit: 2024-01-08 | Discharge: 2024-01-08 | Disposition: A | Discharge status: Home / Self Care | Source: Ambulatory Visit | Attending: Physician Assistant | Admitting: Physician Assistant

## 2024-01-08 DIAGNOSIS — R748 Abnormal levels of other serum enzymes: Secondary | ICD-10-CM | POA: Diagnosis not present

## 2024-01-09 ENCOUNTER — Encounter: Payer: Self-pay | Admitting: Internal Medicine

## 2024-01-10 ENCOUNTER — Other Ambulatory Visit: Payer: Self-pay | Admitting: Internal Medicine

## 2024-01-10 DIAGNOSIS — J301 Allergic rhinitis due to pollen: Secondary | ICD-10-CM | POA: Insufficient documentation

## 2024-01-10 MED ORDER — FLUTICASONE PROPIONATE 50 MCG/ACT NA SUSP: 2.0000 | Freq: Every day | NASAL | 1 refills | Status: DC

## 2024-01-10 MED ORDER — LEVOCETIRIZINE DIHYDROCHLORIDE 5 MG PO TABS
5.0000 mg | ORAL_TABLET | Freq: Every evening | ORAL | 0 refills | Status: DC
Start: 2024-01-10 — End: 2024-02-01

## 2024-01-30 ENCOUNTER — Ambulatory Visit (INDEPENDENT_AMBULATORY_CARE_PROVIDER_SITE_OTHER): Payer: Medicare HMO

## 2024-01-30 VITALS — Ht 73.0 in | Wt 172.0 lb

## 2024-01-30 DIAGNOSIS — Z0000 Encounter for general adult medical examination without abnormal findings: Secondary | ICD-10-CM | POA: Diagnosis not present

## 2024-01-30 NOTE — Progress Notes (Signed)
 Subjective:   Grant Cardenas is a 68 y.o. who presents for a Medicare Wellness preventive visit.  Visit Complete: Virtual I connected with  Grant Cardenas on 01/30/24 by a audio enabled telemedicine application and verified that I am speaking with the correct person using two identifiers.  Patient Location: Home  Provider Location: Home Office  I discussed the limitations of evaluation and management by telemedicine. The patient expressed understanding and agreed to proceed.  Vital Signs: Because this visit was a virtual/telehealth visit, some criteria may be missing or patient reported. Any vitals not documented were not able to be obtained and vitals that have been documented are patient reported.  VideoDeclined- This patient declined Librarian, academic. Therefore the visit was completed with audio only.  Persons Participating in Visit: Patient assisted by spouse.  AWV Questionnaire: No: Patient Medicare AWV questionnaire was not completed prior to this visit.  Cardiac Risk Factors include: advanced age (>15men, >60 women);hypertension;dyslipidemia;male gender     Objective:    Today's Vitals   01/30/24 1512  Weight: 172 lb (78 kg)  Height: 6\' 1"  (1.854 m)  PainSc: 5   PainLoc: Hand   Body mass index is 22.69 kg/m.     01/30/2024    3:18 PM 04/28/2023    8:20 AM 04/11/2023    3:53 PM 07/05/2022    9:48 AM 05/03/2021   11:08 AM 11/28/2020   12:25 PM 02/28/2020    8:25 AM  Advanced Directives  Does Patient Have a Medical Advance Directive? Yes  No No Yes No No  Type of Estate agent of [place];Living will    Living will    Does patient want to make changes to medical advance directive?     No - Patient declined    Copy of Healthcare Power of Attorney in Chart? No - copy requested        Would patient like information on creating a medical advance directive?  No - Patient declined No - Patient declined Yes (ED -  Information included in AVS)  No - Patient declined No - Patient declined    Current Medications (verified) Outpatient Encounter Medications as of 01/30/2024  Medication Sig   Albuterol Sulfate 108 (90 Base) MCG/ACT AEPB Inhale 1-2 puffs into the lungs 4 (four) times daily as needed (wheezing/shortness of breath.).    aspirin  EC 81 MG tablet Take 1 tablet (81 mg total) by mouth daily.   atorvastatin  (LIPITOR ) 40 MG tablet TAKE ONE TABLET BY MOUTH AT BEDTIME FOR CHOLESTEROL   Calcium  Carb-Cholecalciferol 600-10 MG-MCG TABS Take 1 tablet by mouth 2 (two) times daily.   calcium  carbonate (OS-CAL) 1250 (500 Ca) MG chewable tablet CHEW 1-2 TABLETS BY MOUTH AT BEDTIME AS NEEDED FOR INDIGESTION   Carboxymethylcellulose Sod PF 0.25 % SOLN Place 1 drop into both eyes 4 (four) times daily as needed (dry/irritated eyes.).    carvedilol  (COREG ) 3.125 MG tablet Take 1 tablet (3.125 mg total) by mouth 2 (two) times daily with a meal.   Cholecalciferol 25 MCG (1000 UT) tablet Take 1 tablet by mouth daily.   clobetasol ointment (TEMOVATE) 0.05 % Apply 1 application topically 2 (two) times daily as needed (psoriasis).    DULoxetine (CYMBALTA) 30 MG capsule Take 30 mg by mouth daily.   Empagliflozin -metFORMIN  HCl ER 07-999 MG TB24 Take by mouth.   ezetimibe (ZETIA) 10 MG tablet Take 1 tablet by mouth daily.   fluticasone  (CUTIVATE ) 0.05 % cream Apply 1  application topically 2 (two) times daily as needed (psoriasis (face & groin areas)).    fluticasone  (FLONASE ) 50 MCG/ACT nasal spray Place 2 sprays into both nostrils daily.   ibuprofen  (ADVIL ) 600 MG tablet Take 1 tablet (600 mg total) by mouth every 8 (eight) hours as needed.   ketoconazole  (NIZORAL ) 2 % shampoo Apply 1 application topically 2 (two) times a week. Applied to facial hair (beard) & scalp. Leave on 10 minutes prior to washing out.   levocetirizine (XYZAL ) 5 MG tablet Take 1 tablet (5 mg total) by mouth every evening.   losartan (COZAAR) 50 MG  tablet Take 50 mg by mouth 2 (two) times daily.   Menthol-Methyl Salicylate (THERA-GESIC) 0.5-15 % CREA APPLY MODERATE AMOUNT TO AFFECTED AREA TWICE A DAY   metFORMIN  (GLUCOPHAGE ) 500 MG tablet Take 1 tablet (500 mg total) by mouth daily with breakfast.   methocarbamol (ROBAXIN) 500 MG tablet Take 500 mg by mouth.   nitroGLYCERIN  (NITROSTAT ) 0.4 MG SL tablet Place 1 tablet (0.4 mg total) under the tongue every 5 (five) minutes as needed for chest pain.   pantoprazole  (PROTONIX ) 40 MG tablet Take 40 mg by mouth daily.   umeclidinium-vilanterol (ANORO ELLIPTA) 62.5-25 MCG/INH AEPB Inhale 1 puff into the lungs daily.   Ustekinumab (STELARA Woden) Inject 1 Dose into the skin every 3 (three) months. Every 12 weeks   No facility-administered encounter medications on file as of 01/30/2024.    Allergies (verified) Sulfa antibiotics   History: Past Medical History:  Diagnosis Date   CAD (coronary artery disease)    a. anterior ST elevation MI in 09/2015. Cardiac cath showed occluded prox LAD and 60% ostial RCA stenosis. There was also very distal disease affecting the LAD and ramus. He underwent successful angioplasty and drug-eluting stent placement to the LAD. Ejection fraction was 35-40% which improved subsequently an echo to 55-60%.   Chronic obstructive pulmonary disease, unspecified (HCC)    COPD (chronic obstructive pulmonary disease) (HCC) 03/2016   4th stage-Dr. Zoila Hines   Essential (primary) hypertension    Hyperlipidemia    Ischemic cardiomyopathy    a. EF by cath 09/13/2015: 35-40%; b. 09/14/2015: EF 55-60%, Probable akinesis of  the midanteroseptal myocardium. akinesis of the apicalanterior myocardium, GR1DD   MI (myocardial infarction) (HCC)    Psoriasis    Psoriasis, unspecified    ST elevation (STEMI) myocardial infarction involving left anterior descending coronary artery (HCC)  09/2015 09/13/2015   Tobacco use    Vitreous floaters of both eyes    Past Surgical History:   Procedure Laterality Date   CARDIAC CATHETERIZATION N/A 09/13/2015   Procedure: Left Heart Cath and Coronary Angiography;  Surgeon: Wenona Hamilton, MD;  Location: [facility] IN[facility]ASIVE CV [facility];  Service: Cardiovascular;  Laterality: N/A;   CARDIAC CATHETERIZATION N/A 09/13/2015   Procedure: Coronary Stent Intervention;  Surgeon: Wenona Hamilton, MD;  Location: [facility] INV[facility]IVE CV [facility];  Service: Cardiovascular;  Laterality: N/A;   CARDIAC CATHETERIZATION N/A 11/03/2015   Procedure: Left Heart Cath and Coronary Angiography;  Surgeon: Peter M Swaziland, MD;  Location: [facility] INVASIVE CV LAB;  Service: Cardiovascular;  Laterality: N/A;   CATARACT EXTRACTION W/PHACO Left 04/14/2023   Procedure: CATARACT EXTRACTION PHACO AND INTRAOCULAR LENS PLACEMENT (IOC);  Surgeon: Ardeth Krabbe, MD;  Location: [facility];  Service: Ophthalmology;  Laterality: Left;  CDE 7.00   CATARACT EXTRACTION W/PHACO Right 04/28/2023   Procedure: CATARACT EXTRACTION PHACO AND INTRAOCULAR LENS PLACEMENT (IOC);  Surgeon: Ardeth Krabbe, MD;  Location: [facility];  Service: Ophthalmology;  Laterality: Right;  CDE 5.98   COLONOSCOPY N/A 01/29/2016   Procedure: COLONOSCOPY;  Surgeon: Alyce Jubilee, MD;  Location: [facility];  Service: Endoscopy;  Laterality: N/A;  1030-moved to 1215 office to notify    COLONOSCOPY N/A 02/28/2020   Procedure: COLONOSCOPY;  Surgeon: Suzette Espy, MD;  Location: [facility];  Service: Endoscopy;  Laterality: N/A;  9:30   ESOPHAGOGASTRODUODENOSCOPY N/A 01/29/2016   Procedure: ESOPHAGOGASTRODUODENOSCOPY (EGD);  Surgeon: Alyce Jubilee, MD;  Location: [facility];  Service: Endoscopy;  Laterality: N/A;   POLYPECTOMY  02/28/2020   Procedure: POLYPECTOMY;  Surgeon: Suzette Espy, MD;  Location: [facility];  Service: Endoscopy;;   Family History  Problem Relation Age of Onset   Heart Problems Mother    Heart attack Father    Heart disease Father    Cancer Other    Colon cancer Maternal Aunt    Social History    Socioeconomic History   Marital status: Married    Spouse name: Not on file   Number of children: Not on file   Years of education: Not on file   Highest education level: Not on file  Occupational History   Not on file  Tobacco Use   Smoking status: Former    Current packs/day: 0.00    Average packs/day: 0.5 packs/day for 40.0 years (20.0 ttl pk-yrs)    Types: Cigarettes    Start date: 09/14/1975    Quit date: 09/14/2015    Years since quitting: 8.3   Smokeless tobacco: Never  Vaping Use   Vaping status: Former  Substance and Sexual Activity   Alcohol use: No    Alcohol/week: 0.0 standard drinks of alcohol    Comment: Previously heavy beer drinker; quit 20 years ago   Drug use: No   Sexual activity: Never  Other Topics Concern   Not on file  Social History Narrative   Not on file   Social Drivers of Health   Financial Resource Strain: Low Risk  (01/30/2024)   Overall Financial Resource Strain (CARDIA)    Difficulty of Paying Living Expenses: Not hard at all  Food Insecurity: No Food Insecurity (01/30/2024)   Hunger Vital Sign    Worried About Running Out of Food in the Last Year: Never true    Ran Out of Food in the Last Year: Never true  Transportation Needs: No Transportation Needs (01/30/2024)   PRAPARE - Administrator, Civil Service (Medical): No    Lack of Transportation (Non-Medical): No  Physical Activity: Sufficiently Active (01/30/2024)   Exercise Vital Sign    Days of Exercise per Week: 7 days    Minutes of Exercise per Session: 30 min  Stress: No Stress Concern Present (01/30/2024)   [employer] of Occupational Health - Occupational Stress Questionnaire    Feeling of Stress : Not at all  Social Connections: Moderately Isolated (01/30/2024)   Social Connection and Isolation Panel [NHANES]    Frequency of Communication with Friends and Family: Once a week    Frequency of Social Gatherings with Friends and Family: Once a week    Attends  Religious Services: More than 4 times per year    Active Member of [employer] or Organizations: No    Attends Banker Meetings: Never    Marital Status: Married    Tobacco Counseling Counseling given: Yes    Clinical Intake:  Pre-visit preparation completed: Yes  Pain : No/denies pain  Pain Score: 5      BMI - recorded: 22.69 Nutritional Status: BMI of 19-24  Normal Nutritional Risks: None Diabetes: Yes CBG done?: No (telehealth visit.) Did pt. bring in CBG monitor from home?: No  Lab Results  Component Value Date   HGBA1C 6.3 12/12/2023   HGBA1C 6.5 06/14/2023   HGBA1C 5.6 03/13/2023     How often do you need to have someone help you when you read instructions, pamphlets, or other written materials from your doctor or pharmacy?: 1 - Never  Interpreter Needed?: No  Information entered by :: Sally Crazier CMA   Activities of Daily Living     01/30/2024    3:16 PM 04/11/2023    3:55 PM  In your present state of health, do you have any difficulty performing the following activities:  Hearing? 0   Vision? 0   Comment Sees [facility] for eye exams   Difficulty concentrating or making decisions? 0   Walking or climbing stairs? 1   Dressing or bathing? 0   Doing errands, shopping? 0 0  Preparing Food and eating ? N   Using the Toilet? N   In the past six months, have you accidently leaked urine? N   Do you have problems with loss of bowel control? N   Managing your Medications? Y   Comment wife manages medications   Managing your Finances? N   Housekeeping or managing your Housekeeping? N     Patient Care Team: Meldon Sport, MD as PCP - General (Internal Medicine) Atanacio Layman, FNP (Family Medicine)  Indicate any recent Medical Services you may have received from other than [facility] providers in the past year (date may be approximate).     Assessment:   This is a routine wellness examination for [place].  Hearing/Vision screen Hearing Screening -  Comments:: Patient denies any hearing difficulties.   Vision Screening - Comments:: Patient wears reading glasses only. Up to date with yearly exams.  Patient sees [facility] for [facility] care    Goals Addressed             This Visit's Progress    Patient Stated       Work on getting pain under better control.        Depression Screen     01/30/2024    3:24 PM 12/19/2023   10:26 AM 01/06/2023   11:08 AM 01/04/2022   11:20 AM 05/03/2021   11:09 AM 05/03/2021   11:06 AM 02/18/2021   10:00 AM  PHQ 2/9 Scores  PHQ - 2 Score 0 0 0 0 0 0 0  PHQ- 9 Score 0 0  0       Fall Risk     01/30/2024    3:19 PM 12/19/2023   10:26 AM 01/06/2023   11:08 AM 07/05/2022    9:54 AM 01/04/2022   11:20 AM  Fall Risk   Falls in the past year? 0 0 0 0 0  Number falls in past yr: 0 0 0 0 0  Injury with Fall? 0 0 0 0 0  Risk for fall due to : No Fall Risks No Fall Risks   No Fall Risks  Follow up Falls prevention discussed;Falls evaluation completed Falls evaluation completed   Falls evaluation completed    MEDICARE RISK AT HOME:  Medicare Risk at Home Any stairs in or around the home?: Yes If so, are there any without handrails?: No Home free of loose throw  rugs in walkways, pet beds, electrical cords, etc?: Yes Adequate lighting in your home to reduce risk of falls?: Yes Life alert?: No Use of a cane, walker or w/c?: Yes Grab bars in the bathroom?: Yes Shower chair or bench in shower?: Yes Elevated toilet seat or a handicapped toilet?: Yes  TIMED UP AND GO:  Was the test performed?  No  Cognitive Function: 6CIT completed        01/30/2024    3:24 PM 05/03/2021   11:16 AM  6CIT Screen  What Year? 0 points 0 points  What month? 0 points 0 points  What time? 0 points 0 points  Count back from 20 0 points 0 points  Months in reverse 0 points 0 points  Repeat phrase 0 points 2 points  Total Score 0 points 2 points    Immunizations Immunization History  Administered Date(s)  Administered   Fluad Quad(high Dose 65+) 07/03/2020, 07/13/2021   Influenza Inj Mdck Quad Pf 08/11/2016   Influenza, High Dose Seasonal PF 09/05/2016, 06/21/2022, 07/19/2023   Influenza, Seasonal, Injecte, Preservative Fre 06/18/2019   Influenza,inj,Quad PF,6+ Mos 07/07/2017, 07/02/2018, 06/18/2019   Influenza-Unspecified 06/18/2019, 06/21/2022   Moderna Covid-19 Fall Seasonal Vaccine 63yrs & older 08/04/2022, 07/19/2023   Moderna Covid-19 Vaccine Bivalent Booster 2yrs & up 06/24/2021   Moderna Sars-Covid-2 Vaccination 11/07/2019, 12/05/2019, 08/04/2020, 01/12/2021   PNEUMOCOCCAL CONJUGATE-20 12/19/2023   Pneumococcal Conjugate-13 06/07/2016   Pneumococcal Polysaccharide-23 07/07/2017   Rsv, Bivalent, Protein Subunit Rsvpref,pf Pattricia Bores) 08/11/2022   Td (Adult) 06/07/2016   Tdap 11/03/2004, 06/21/2022   Zoster Recombinant(Shingrix) 10/04/2019, 03/20/2020    Screening Tests Health Maintenance  Topic Date Due   Lung Cancer Screening  09/03/2004   COVID-19 Vaccine (8 - Moderna risk 2024-25 season) 01/17/2024   INFLUENZA VACCINE  05/03/2024   HEMOGLOBIN A1C  06/13/2024   OPHTHALMOLOGY EXAM  10/30/2024   Diabetic kidney evaluation - eGFR measurement  12/11/2024   Diabetic kidney evaluation - Urine ACR  12/11/2024   FOOT EXAM  12/18/2024   Medicare Annual Wellness (AWV)  01/29/2025   Colonoscopy  02/27/2030   DTaP/Tdap/Td (4 - Td or Tdap) 06/21/2032   Pneumonia Vaccine 52+ Years old  Completed   Hepatitis C Screening  Completed   Zoster Vaccines- Shingrix  Completed   HPV VACCINES  Aged Out   Meningococcal B Vaccine  Aged Out    Health Maintenance  Health Maintenance Due  Topic Date Due   Lung Cancer Screening  09/03/2004   COVID-19 Vaccine (8 - Moderna risk 2024-25 season) 01/17/2024   Health Maintenance Items Addressed: Outside records requested from the [facility] [place] for most recent Lung CA Screening  Additional Screening:  Vision Screening: Recommended annual  ophthalmology exams for early detection of glaucoma and other disorders of the eye. [facility]  Dental Screening: Recommended annual dental exams for proper oral hygiene  Community Resource Referral / Chronic Care Management: CRR required this visit?  No   CCM required this visit?  No     Plan:     I have personally reviewed and noted the following in the patient's chart:   Medical and social history Use of alcohol, tobacco or illicit drugs  Current medications and supplements including opioid prescriptions. Patient is not currently taking opioid prescriptions. Functional ability and status Nutritional status Physical activity Advanced directives List of other physicians Hospitalizations, surgeries, and ER visits in previous 12 months Vitals Screenings to include cognitive, depression, and falls Referrals and appointments  In addition, I  have reviewed and discussed with patient certain preventive protocols, quality metrics, and best practice recommendations. A written personalized care plan for preventive services as well as general preventive health recommendations were provided to patient.      Yarel Rushlow, CMA   01/30/2024   After Visit Summary: (MyChart) Due to this being a telephonic visit, the after visit summary with patients personalized plan was offered to patient via MyChart   Notes: Nothing significant to report at this time.

## 2024-01-30 NOTE — Patient Instructions (Signed)
 [name] , Thank you for taking time to come for your Medicare Wellness Visit. I appreciate your ongoing commitment to your health goals. Please review the following plan we discussed and let me know if I can assist you in the future.   Referrals/Orders/Follow-Ups/Clinician Recommendations:    Next Medicare AWV: Feb 03, 2025 at 1:10 pm video visit.      Aim for 30 minutes of exercise or brisk walking, 6-8 glasses of water , and 5 servings of fruits and vegetables each day.   This is a list of the screening recommended for you and due dates:  Health Maintenance  Topic Date Due   Screening for Lung Cancer  09/03/2004   COVID-19 Vaccine (8 - Moderna risk 2024-25 season) 01/17/2024   Flu Shot  05/03/2024   Hemoglobin A1C  06/13/2024   Eye exam for diabetics  10/30/2024   Yearly kidney function blood test for diabetes  12/11/2024   Yearly kidney health urinalysis for diabetes  12/11/2024   Complete foot exam   12/18/2024   Medicare Annual Wellness Visit  01/29/2025   Colon Cancer Screening  02/27/2030   DTaP/Tdap/Td vaccine (4 - Td or Tdap) 06/21/2032   Pneumonia Vaccine  Completed   Hepatitis C Screening  Completed   Zoster (Shingles) Vaccine  Completed   HPV Vaccine  Aged Out   Meningitis B Vaccine  Aged Out    Advanced directives: (Copy Requested) Please bring a copy of your health care power of attorney and living will to the office to be added to your chart at your convenience. You can mail to [facility] [address] [place], [place] [address] or email to [email or web address] Advance Care Planning is important because it:  [x]  Makes sure you receive the medical care that is consistent with your values, goals, and preferences  [x]  It provides guidance to your family and loved ones and it also reduces their decisional burden about whether or not they are making the right decisions based on what you want done  Follow the link provided in your after visit  summary or read over the paperwork we have mailed to you to help you started getting your Advance Directives in place. If you need assistance in completing these, please reach out to us  so that we can help you!  Next Medicare Annual Wellness Visit scheduled for next year: yes  Understanding Your Risk for Falls Millions of people have serious injuries from falls each year. It is important to understand your risk of falling. Talk with your health care provider about your risk and what you can do to lower it. If you do have a serious fall, make sure to tell your provider. Falling once raises your risk of falling again. How can falls affect me? Serious injuries from falls are common. These include: Broken bones, such as hip fractures. Head injuries, such as traumatic brain injuries (TBI) or concussions. A fear of falling can cause you to avoid activities and stay at home. This can make your muscles weaker and raise your risk for a fall. What can increase my risk? There are a number of risk factors that increase your risk for falling. The more risk factors you have, the higher your risk of falling. Serious injuries from a fall happen most often to people who are older than 70 years old. Teenagers and young adults ages 45-29 are also at higher risk. Common risk factors include: Weakness in the lower body. Being generally weak or  confused due to long-term (chronic) illness. Dizziness or balance problems. Poor vision. Medicines that cause dizziness or drowsiness. These may include: Medicines for your blood pressure, heart, anxiety, insomnia, or swelling (edema). Pain medicines. Muscle relaxants. Other risk factors include: Drinking alcohol. Having had a fall in the past. Having foot pain or wearing improper footwear. Working at a dangerous job. Having any of the following in your home: Tripping hazards, such as floor clutter or loose rugs. Poor lighting. Pets. Having dementia or memory  loss. What actions can I take to lower my risk of falling?  Physical activity Stay physically fit. Do strength and balance exercises. Consider taking a regular class to build strength and balance. Yoga and tai chi are good options. Vision Have your eyes checked every year and your prescription for glasses or contacts updated as needed. Shoes and walking aids Wear non-skid shoes. Wear shoes that have rubber soles and low heels. Do not wear high heels. Do not walk around the house in socks or slippers. Use a cane or walker as told by your provider. Home safety Attach secure railings on both sides of your stairs. Install grab bars for your bathtub, shower, and toilet. Use a non-skid mat in your bathtub or shower. Attach bath mats securely with double-sided, non-slip rug tape. Use good lighting in all rooms. Keep a flashlight near your bed. Make sure there is a clear path from your bed to the bathroom. Use night-lights. Do not use throw rugs. Make sure all carpeting is taped or tacked down securely. Remove all clutter from walkways and stairways, including extension cords. Repair uneven or broken steps and floors. Avoid walking on icy or slippery surfaces. Walk on the grass instead of on icy or slick sidewalks. Use ice melter to get rid of ice on walkways in the winter. Use a cordless phone. Questions to ask your health care provider Can you help me check my risk for a fall? Do any of my medicines make me more likely to fall? Should I take a vitamin D  supplement? What exercises can I do to improve my strength and balance? Should I make an appointment to have my vision checked? Do I need a bone density test to check for weak bones (osteoporosis)? Would it help to use a cane or a walker? Where to find more information Centers for Disease Control and Prevention, STEADI: TonerPromos.no Community-Based Fall Prevention Programs: TonerPromos.no [employer] on Aging: BaseRingTones.pl Contact a health care  provider if: You fall at home. You are afraid of falling at home. You feel weak, drowsy, or dizzy. This information is not intended to replace advice given to you by your health care provider. Make sure you discuss any questions you have with your health care provider. Document Revised: 05/23/2022 Document Reviewed: 05/23/2022 Elsevier Patient Education  2024 [employer].

## 2024-02-01 ENCOUNTER — Ambulatory Visit: Payer: Medicare HMO | Attending: Cardiovascular Disease | Admitting: Cardiovascular Disease

## 2024-02-01 ENCOUNTER — Other Ambulatory Visit: Payer: Self-pay | Admitting: Internal Medicine

## 2024-02-01 ENCOUNTER — Encounter: Payer: Self-pay | Admitting: Cardiovascular Disease

## 2024-02-01 VITALS — BP 140/60 | HR 47 | Ht 72.0 in | Wt 174.0 lb

## 2024-02-01 DIAGNOSIS — I2510 Atherosclerotic heart disease of native coronary artery without angina pectoris: Secondary | ICD-10-CM

## 2024-02-01 DIAGNOSIS — E785 Hyperlipidemia, unspecified: Secondary | ICD-10-CM | POA: Diagnosis not present

## 2024-02-01 DIAGNOSIS — I255 Ischemic cardiomyopathy: Secondary | ICD-10-CM | POA: Diagnosis not present

## 2024-02-01 DIAGNOSIS — I10 Essential (primary) hypertension: Secondary | ICD-10-CM

## 2024-02-01 DIAGNOSIS — J301 Allergic rhinitis due to pollen: Secondary | ICD-10-CM

## 2024-02-01 NOTE — Progress Notes (Signed)
 Cardiology Office Note   Date:  02/01/2024   ID:  Grant Cardenas, DOB [birth year], MRN [id number]  PCP:  Meldon Sport, MD  Cardiologist:   Antionette Kirks, MD   No chief complaint on file.     History of Present Illness: Grant Cardenas is a 68 y.o. male who presents for  a follow-up visit regarding coronary artery disease and ischemic cardiomyopathy. He presented in December 2016 with anterior ST elevation myocardial infarction. Cardiac catheterization showed occluded proximal LAD and 60% ostial RCA stenosis. There was also very distal disease affecting the LAD and ramus. He underwent successful angioplasty and drug-eluting stent placement to the LAD. Ejection fraction was 35-40% which improved subsequently to 50-55%. His other medical problems include psoriasis, Previous tobacco use, COPD and hyperlipidemia.  He is known to have right carotid bruit but previous carotid Doppler showed no obstructive disease.   He had an echocardiogram done at the [place] in May of 2021 which showed an EF of 50 to 55% with mild anterior, anteroseptal and apical hypokinesis.  During last visit, I decreased carvedilol  due to bradycardia.  He has been doing well from a cardiac standpoint with no chest pain, shortness of breath or palpitations.  Even though he is more bradycardic today, he denies dizziness or syncope. He was diagnosed with polymyalgia rheumatica over the last year and was treated with steroids but his diabetes control worsened significantly.  He is off steroids now.  Past Medical History:  Diagnosis Date   CAD (coronary artery disease)    a. anterior ST elevation MI in 09/2015. Cardiac cath showed occluded prox LAD and 60% ostial RCA stenosis. There was also very distal disease affecting the LAD and ramus. He underwent successful angioplasty and drug-eluting stent placement to the LAD. Ejection fraction was 35-40% which improved subsequently an echo to 55-60%.   Chronic obstructive  pulmonary disease, unspecified (HCC)    COPD (chronic obstructive pulmonary disease) (HCC) 03/2016   4th stage-Dr. Zoila Hines   Essential (primary) hypertension    Hyperlipidemia    Ischemic cardiomyopathy    a. EF by cath 09/13/2015: 35-40%; b. 09/14/2015: EF 55-60%, Probable akinesis of  the midanteroseptal myocardium. akinesis of the apicalanterior myocardium, GR1DD   MI (myocardial infarction) (HCC)    Psoriasis    Psoriasis, unspecified    ST elevation (STEMI) myocardial infarction involving left anterior descending coronary artery (HCC)  09/2015 09/13/2015   Tobacco use    Vitreous floaters of both eyes     Past Surgical History:  Procedure Laterality Date   CARDIAC CATHETERIZATION N/A 09/13/2015   Procedure: Left Heart Cath and Coronary Angiography;  Surgeon: Wenona Hamilton, MD;  Location: [facility] IN[facility]ASIVE CV [facility];  Service: Cardiovascular;  Laterality: N/A;   CARDIAC CATHETERIZATION N/A 09/13/2015   Procedure: Coronary Stent Intervention;  Surgeon: Wenona Hamilton, MD;  Location: [facility] CV [facility];  Service: Cardiovascular;  Laterality: N/A;   CARDIAC CATHETERIZATION N/A 11/03/2015   Procedure: Left Heart Cath and Coronary Angiography;  Surgeon: Peter M Swaz[place]and, MD;  Location: [facility] INVASIVE CV LAB;  Service: Cardiovascular;  Laterality: N/A;   CATARACT EXTRACTION W/PHACO Left 04/14/2023   Procedure: CATARACT EXTRACTION PHACO AND INTRAOCULAR LENS PLACEMENT (IOC);  Surgeon: Ardeth Krabbe, MD;  Location: [facility];  Service: Ophthalmology;  Laterality: Left;  CDE 7.00   CATARACT EXTRACTION W/PHACO Right 04/28/2023   Procedure: CATARACT EXTRACTION PHACO AND INTRAOCULAR LENS PLACEMENT (IOC);  Surgeon: Ardeth Krabbe, MD;  Location: [facility];  Service:  Ophthalmology;  Laterality: Right;  CDE 5.98   COLONOSCOPY N/A 01/29/2016   Procedure: COLONOSCOPY;  Surgeon: Alyce Jubilee, MD;  Location: [facility];  Service: Endoscopy;  Laterality: N/A;  1030-moved to 1215 office to notify    COLONOSCOPY  N/A 02/28/2020   Procedure: COLONOSCOPY;  Surgeon: Suzette Espy, MD;  Location: [facility];  Service: Endoscopy;  Laterality: N/A;  9:30   ESOPHAGOGASTRODUODENOSCOPY N/A 01/29/2016   Procedure: ESOPHAGOGASTRODUODENOSCOPY (EGD);  Surgeon: Alyce Jubilee, MD;  Location: [facility];  Service: Endoscopy;  Laterality: N/A;   POLYPECTOMY  02/28/2020   Procedure: POLYPECTOMY;  Surgeon: Suzette Espy, MD;  Location: [facility];  Service: Endoscopy;;     Current Outpatient Medications  Medication Sig Dispense Refill   acetaminophen  (TYLENOL ) 325 MG tablet Take 650 mg by mouth.     Albuterol Sulfate 108 (90 Base) MCG/ACT AEPB Inhale 1-2 puffs into the lungs 4 (four) times daily as needed (wheezing/shortness of breath.).      aspirin  EC 81 MG tablet Take 1 tablet (81 mg total) by mouth daily. 90 tablet 3   atorvastatin  (LIPITOR ) 40 MG tablet TAKE ONE TABLET BY MOUTH AT BEDTIME FOR CHOLESTEROL     Calcium  Carb-Cholecalciferol 600-10 MG-MCG TABS Take 1 tablet by mouth 2 (two) times daily.     calcium  carbonate (OS-CAL) 1250 (500 Ca) MG chewable tablet CHEW 1-2 TABLETS BY MOUTH AT BEDTIME AS NEEDED FOR INDIGESTION     Carboxymethylcellulose Sod PF 0.25 % SOLN Place 1 drop into both eyes 4 (four) times daily as needed (dry/irritated eyes.).      carvedilol  (COREG ) 3.125 MG tablet Take 1 tablet (3.125 mg total) by mouth 2 (two) times daily with a meal. 180 tablet 3   Cholecalciferol 25 MCG (1000 UT) tablet Take 1 tablet by mouth daily.     clobetasol ointment (TEMOVATE) 0.05 % Apply 1 application topically 2 (two) times daily as needed (psoriasis).   1   DULoxetine (CYMBALTA) 30 MG capsule Take 30 mg by mouth daily.     Empagliflozin -metFORMIN  HCl ER 07-999 MG TB24 Take by mouth.     ezetimibe (ZETIA) 10 MG tablet Take 1 tablet by mouth daily.     fluticasone  (CUTIVATE ) 0.05 % cream Apply 1 application topically 2 (two) times daily as needed (psoriasis (face & groin areas)).      fluticasone   (FLONASE ) 50 MCG/ACT nasal spray Place 2 sprays into both nostrils daily. 16 g 1   ibuprofen  (ADVIL ) 600 MG tablet Take 1 tablet (600 mg total) by mouth every 8 (eight) hours as needed. 30 tablet 1   ketoconazole  (NIZORAL ) 2 % shampoo Apply 1 application topically 2 (two) times a week. Applied to facial hair (beard) & scalp. Leave on 10 minutes prior to washing out.     levocetirizine (XYZAL ) 5 MG tablet Take 1 tablet (5 mg total) by mouth every evening. 30 tablet 0   losartan (COZAAR) 50 MG tablet Take 50 mg by mouth 2 (two) times daily.     Menthol-Methyl Salicylate (THERA-GESIC) 0.5-15 % CREA APPLY MODERATE AMOUNT TO AFFECTED AREA TWICE A DAY     metFORMIN  (GLUCOPHAGE ) 500 MG tablet Take 1 tablet (500 mg total) by mouth daily with breakfast. 90 tablet 1   methocarbamol (ROBAXIN) 500 MG tablet Take 500 mg by mouth.     nitroGLYCERIN  (NITROSTAT ) 0.4 MG SL tablet Place 1 tablet (0.4 mg total) under the tongue every 5 (five) minutes as needed for chest  pain. 25 tablet 12   pantoprazole  (PROTONIX ) 40 MG tablet Take 40 mg by mouth daily.     umeclidinium-vilanterol (ANORO ELLIPTA) 62.5-25 MCG/INH AEPB Inhale 1 puff into the lungs daily.     Ustekinumab (STELARA [place]) Inject 1 Dose into the skin every 3 (three) months. Every 12 weeks     No current facility-administered medications for this visit.    Allergies:   Sulfa antibiotics    Social History:  The patient  reports that he quit smoking about 8 years ago. His smoking use included cigarettes. He started smoking about 48 years ago. He has a 20 pack-year smoking history. He has never used smokeless tobacco. He reports that he does not drink alcohol and does not use drugs.   Family History:  The patient's family history includes Cancer in an other family member; Colon cancer in his maternal aunt; Heart Problems in his mother; Heart attack in his father; Heart disease in his father.      PHYSICAL EXAM: VS:  BP (!) 140/60 (BP Location: Left Arm,  Patient Position: Sitting, Cuff Size: Normal)   Pulse (!) 47   Ht 6' (1.829 m)   Wt 174 lb (78.9 kg)   SpO2 99%   BMI 23.60 kg/m  , BMI Body mass index is 23.6 kg/m. GEN: Well nourished, well developed, in no acute distress  HEENT: normal  Neck: no JVD or masses. Right carotid bruit Cardiac: RRR; no murmurs, rubs, or gallops,no edema  Respiratory:  clear to auscultation bilaterally, normal work of breathing GI: soft, nontender, nondistended, + BS MS: no deformity or atrophy  Skin: warm and dry, no rash Neuro:  Strength and sensation are intact Psych: euthymic mood, full affect   EKG:  EKG  ordered today. EKG showed: Sinus bradycardia Septal infarct , age undetermined    Recent Labs: 06/14/2023: BUN 14 12/12/2023: Creatinine 1.1    Lipid Panel    Component Value Date/Time   CHOL 117 12/12/2023 0000   CHOL 212 (H) 09/23/2015 1540   TRIG 71 12/12/2023 0000   HDL 42 12/12/2023 0000   HDL 41 09/23/2015 1540   CHOLHDL 3.7 11/26/2015 0815   VLDL 17 11/26/2015 0815   LDLCALC 58 12/12/2023 0000   LDLCALC 137 (H) 09/23/2015 1540      Wt Readings from Last 3 Encounters:  02/01/24 174 lb (78.9 kg)  01/30/24 172 lb (78 kg)  12/19/23 172 lb 3.2 oz (78.1 kg)        ASSESSMENT AND PLAN:  1.  Coronary artery disease involving native coronary arteries of native heart without angina :  He is doing well overall. Continue aspirin  indefinitely.  2.  Ischemic cardiomyopathy: Most recent echocardiogram showed an EF of 50 to 55%.  He does not require diuretics and he appears to be euvolemic.  Continue losartan.  3. Hyperlipidemia : He is doing very well on atorvastatin  and ezetimibe.  I reviewed most recent lipid profile done in March which showed an LDL of 58.  4. Right carotid bruit: Carotid Doppler in 2018 showed mild nonobstructive disease.    5.  Essential hypertension: He continues to be bradycardic with a heart rate of 47 bpm.  I elected to discontinue carvedilol .   Continue losartan.  If blood pressure increases, consider adding amlodipine.   Disposition:   FU with me in 12 months  Signed,  Antionette Kirks, MD  02/01/2024 11:09 AM    [facility]

## 2024-02-01 NOTE — Patient Instructions (Signed)
 Medication Instructions:  STOP the Carvedilol   *If you need a refill on your cardiac medications before your next appointment, please call your pharmacy*  Lab Work: None ordered If you have labs (blood work) drawn today and your tests are completely normal, you will receive your results only by: MyChart Message (if you have MyChart) OR A paper copy in the mail If you have any lab test that is abnormal or we need to change your treatment, we will call you to review the results.  Testing/Procedures: None ordered  Follow-Up: At [facility], you and your health needs are our priority.  As part of our continuing mission to provide you with exceptional heart care, our providers are all part of one team.  This team includes your primary Cardiologist (physician) and Advanced Practice Providers or APPs (Physician Assistants and Nurse Practitioners) who all work together to provide you with the care you need, when you need it.  Your next appointment:   12 month(s)  Provider:   You may see Dr. Alvenia Aus or one of the following Advanced Practice Providers on your designated Care Team:   Laneta Pintos, NP Gildardo Labrador, PA-C Varney Gentleman, PA-C Cadence Plentywood, PA-C Ronald Cockayne, NP Morey Ar, NP    We recommend signing up for the patient portal called "MyChart".  Sign up information is provided on this After Visit Summary.  MyChart is used to connect with patients for Virtual Visits (Telemedicine).  Patients are able to view lab/test results, encounter notes, upcoming appointments, etc.  Non-urgent messages can be sent to your provider as well.   To learn more about what you can do with MyChart, go to [email or web address].

## 2024-02-14 ENCOUNTER — Encounter: Payer: Self-pay | Admitting: Nurse Practitioner

## 2024-02-14 ENCOUNTER — Ambulatory Visit (INDEPENDENT_AMBULATORY_CARE_PROVIDER_SITE_OTHER): Admitting: Nurse Practitioner

## 2024-02-14 VITALS — BP 118/68 | HR 66 | Ht 72.0 in | Wt 172.0 lb

## 2024-02-14 DIAGNOSIS — Z7984 Long term (current) use of oral hypoglycemic drugs: Secondary | ICD-10-CM

## 2024-02-14 DIAGNOSIS — E119 Type 2 diabetes mellitus without complications: Secondary | ICD-10-CM

## 2024-02-14 NOTE — Progress Notes (Signed)
 Endocrinology Follow Up Note       02/14/2024, 1:26 PM   Subjective:    Patient ID: Grant Cardenas, male    DOB: 10-14-1955.  Grant Cardenas is being seen in follow up after being seen in consultation for management of currently uncontrolled symptomatic diabetes requested by  Meldon Sport, MD.   Past Medical History:  Diagnosis Date   CAD (coronary artery disease)    a. anterior ST elevation MI in 09/2015. Cardiac cath showed occluded prox LAD and 60% ostial RCA stenosis. There was also very distal disease affecting the LAD and ramus. He underwent successful angioplasty and drug-eluting stent placement to the LAD. Ejection fraction was 35-40% which improved subsequently an echo to 55-60%.   Chronic obstructive pulmonary disease, unspecified (HCC)    COPD (chronic obstructive pulmonary disease) (HCC) 03/2016   4th stage-Dr. Zoila Hines   Essential (primary) hypertension    Hyperlipidemia    Ischemic cardiomyopathy    a. EF by cath 09/13/2015: 35-40%; b. 09/14/2015: EF 55-60%, Probable akinesis of  the midanteroseptal myocardium. akinesis of the apicalanterior myocardium, GR1DD   MI (myocardial infarction) (HCC)    Psoriasis    Psoriasis, unspecified    ST elevation (STEMI) myocardial infarction involving left anterior descending coronary artery (HCC)  09/2015 09/13/2015   Tobacco use    Vitreous floaters of both eyes     Past Surgical History:  Procedure Laterality Date   CARDIAC CATHETERIZATION N/A 09/13/2015   Procedure: Left Heart Cath and Coronary Angiography;  Surgeon: Wenona Hamilton, MD;  Location: [facility];  Service: Cardiovascular;  Laterality: N/A;   CARDIAC CATHETERIZATION N/A 09/13/2015   Procedure: Coronary Stent Intervention;  Surgeon: Wenona Hamilton, MD;  Location: [facility];  Service: Cardiovascular;  Laterality: N/A;   CARDIAC CATHETERIZATION N/A 11/03/2015    Procedure: Left Heart Cath and Coronary Angiography;  Surgeon: Peter M Swaziland, MD;  Location: [facility] INVASIVE CV LAB;  Service: Cardiovascular;  Laterality: N/A;   CATARACT EXTRACTION W/PHACO Left 04/14/2023   Procedure: CATARACT EXTRACTION PHACO AND INTRAOCULAR LENS PLACEMENT (IOC);  Surgeon: Ardeth Krabbe, MD;  Location: [facility];  Service: Ophthalmology;  Laterality: Left;  CDE 7.00   CATARACT EXTRACTION W/PHACO Right 04/28/2023   Procedure: CATARACT EXTRACTION PHACO AND INTRAOCULAR LENS PLACEMENT (IOC);  Surgeon: Ardeth Krabbe, MD;  Location: [facility];  Service: Ophthalmology;  Laterality: Right;  CDE 5.98   COLONOSCOPY N/A 01/29/2016   Procedure: COLONOSCOPY;  Surgeon: Alyce Jubilee, MD;  Location: [facility];  Service: Endoscopy;  Laterality: N/A;  1030-moved to 1215 office to notify    COLONOSCOPY N/A 02/28/2020   Procedure: COLONOSCOPY;  Surgeon: Suzette Espy, MD;  Location: [facility];  Service: Endoscopy;  Laterality: N/A;  9:30   ESOPHAGOGASTRODUODENOSCOPY N/A 01/29/2016   Procedure: ESOPHAGOGASTRODUODENOSCOPY (EGD);  Surgeon: Alyce Jubilee, MD;  Location: [facility];  Service: Endoscopy;  Laterality: N/A;   POLYPECTOMY  02/28/2020   Procedure: POLYPECTOMY;  Surgeon: Suzette Espy, MD;  Location: [facility];  Service: Endoscopy;;    Social History   Socioeconomic History   Marital status: Married    Spouse name: Not  on file   Number of children: Not on file   Years of education: Not on file   Highest education level: Not on file  Occupational History   Not on file  Tobacco Use   Smoking status: Former    Current packs/day: 0.00    Average packs/day: 0.5 packs/day for 40.0 years (20.0 ttl pk-yrs)    Types: Cigarettes    Start date: 09/14/1975    Quit date: 09/14/2015    Years since quitting: 8.4   Smokeless tobacco: Never  Vaping Use   Vaping status: Former  Substance and Sexual Activity   Alcohol use: No    Alcohol/week: 0.0 standard drinks of alcohol     Comment: Previously heavy beer drinker; quit 20 years ago   Drug use: No   Sexual activity: Never  Other Topics Concern   Not on file  Social History Narrative   Not on file   Social Drivers of Health   Financial Resource Strain: Low Risk  (01/30/2024)   Overall Financial Resource Strain (CARDIA)    Difficulty of Paying Living Expenses: Not hard at all  Food Insecurity: No Food Insecurity (01/30/2024)   Hunger Vital Sign    Worried About Running Out of Food in the Last Year: Never true    Ran Out of Food in the Last Year: Never true  Transportation Needs: No Transportation Needs (01/30/2024)   PRAPARE - Administrator, Civil Service (Medical): No    Lack of Transportation (Non-Medical): No  Physical Activity: Sufficiently Active (01/30/2024)   Exercise Vital Sign    Days of Exercise per Week: 7 days    Minutes of Exercise per Session: 30 min  Stress: No Stress Concern Present (01/30/2024)   [employer] of Occupational Health - Occupational Stress Questionnaire    Feeling of Stress : Not at all  Social Connections: Moderately Isolated (01/30/2024)   Social Connection and Isolation Panel [NHANES]    Frequency of Communication with Friends and Family: Once a week    Frequency of Social Gatherings with Friends and Family: Once a week    Attends Religious Services: More than 4 times per year    Active Member of [employer] or Organizations: No    Attends Banker Meetings: Never    Marital Status: Married    Family History  Problem Relation Age of Onset   Heart Problems Mother    Heart attack Father    Heart disease Father    Cancer Other    Colon cancer Maternal Aunt     Outpatient Encounter Medications as of 02/14/2024  Medication Sig   acetaminophen  (TYLENOL ) 325 MG tablet Take 650 mg by mouth.   Albuterol Sulfate 108 (90 Base) MCG/ACT AEPB Inhale 1-2 puffs into the lungs 4 (four) times daily as needed (wheezing/shortness of breath.).    aspirin  EC 81  MG tablet Take 1 tablet (81 mg total) by mouth daily.   atorvastatin  (LIPITOR ) 40 MG tablet TAKE ONE TABLET BY MOUTH AT BEDTIME FOR CHOLESTEROL   Calcium  Carb-Cholecalciferol 600-10 MG-MCG TABS Take 1 tablet by mouth 2 (two) times daily.   calcium  carbonate (OS-CAL) 1250 (500 Ca) MG chewable tablet CHEW 1-2 TABLETS BY MOUTH AT BEDTIME AS NEEDED FOR INDIGESTION   Carboxymethylcellulose Sod PF 0.25 % SOLN Place 1 drop into both eyes 4 (four) times daily as needed (dry/irritated eyes.).    Cholecalciferol 25 MCG (1000 UT) tablet Take 1 tablet by mouth daily.   clobetasol ointment (TEMOVATE)  0.05 % Apply 1 application topically 2 (two) times daily as needed (psoriasis).    DULoxetine (CYMBALTA) 30 MG capsule Take 30 mg by mouth daily.   Empagliflozin -metFORMIN  HCl ER 07-999 MG TB24 Take by mouth.   ezetimibe (ZETIA) 10 MG tablet Take 1 tablet by mouth daily.   fluticasone  (CUTIVATE ) 0.05 % cream Apply 1 application topically 2 (two) times daily as needed (psoriasis (face & groin areas)).    fluticasone  (FLONASE ) 50 MCG/ACT nasal spray SPRAY 2 SPRAYS INTO EACH NOSTRIL EVERY DAY   ibuprofen  (ADVIL ) 600 MG tablet Take 1 tablet (600 mg total) by mouth every 8 (eight) hours as needed.   ketoconazole  (NIZORAL ) 2 % shampoo Apply 1 application topically 2 (two) times a week. Applied to facial hair (beard) & scalp. Leave on 10 minutes prior to washing out.   levocetirizine (XYZAL ) 5 MG tablet TAKE 1 TABLET BY MOUTH EVERY DAY IN THE EVENING   losartan (COZAAR) 50 MG tablet Take 50 mg by mouth 2 (two) times daily.   meloxicam (MOBIC) 15 MG tablet Take 15 mg by mouth daily.   Menthol-Methyl Salicylate (THERA-GESIC) 0.5-15 % CREA APPLY MODERATE AMOUNT TO AFFECTED AREA TWICE A DAY   metFORMIN  (GLUCOPHAGE ) 500 MG tablet Take 1 tablet (500 mg total) by mouth daily with breakfast.   methocarbamol (ROBAXIN) 500 MG tablet Take 500 mg by mouth.   nitroGLYCERIN  (NITROSTAT ) 0.4 MG SL tablet Place 1 tablet (0.4 mg total)  under the tongue every 5 (five) minutes as needed for chest pain.   pantoprazole  (PROTONIX ) 40 MG tablet Take 40 mg by mouth daily.   umeclidinium-vilanterol (ANORO ELLIPTA) 62.5-25 MCG/INH AEPB Inhale 1 puff into the lungs daily.   Ustekinumab (STELARA [place]) Inject 1 Dose into the skin every 3 (three) months. Every 12 weeks   No facility-administered encounter medications on file as of 02/14/2024.    ALLERGIES: Allergies  Allergen Reactions   Sulfa Antibiotics Other (See Comments)    Sulfa drugs (unaware of this reaction per patient spouse)      VACCINATION STATUS: Immunization History  Administered Date(s) Administered   Fluad Quad(high Dose 65+) 07/03/2020, 07/13/2021   Influenza Inj Mdck Quad Pf 08/11/2016   Influenza, High Dose Seasonal PF 09/05/2016, 06/21/2022, 07/19/2023   Influenza, Seasonal, Injecte, Preservative Fre 06/18/2019   Influenza,inj,Quad PF,6+ Mos 07/07/2017, 07/02/2018, 06/18/2019   Influenza-Unspecified 06/18/2019, 06/21/2022   Moderna Covid-19 Fall Seasonal Vaccine 59yrs & older 08/04/2022, 07/19/2023   Moderna Covid-19 Vaccine Bivalent Booster 76yrs & up 06/24/2021   Moderna Sars-Covid-2 Vaccination 11/07/2019, 12/05/2019, 08/04/2020, 01/12/2021   PNEUMOCOCCAL CONJUGATE-20 12/19/2023   Pneumococcal Conjugate-13 06/07/2016   Pneumococcal Polysaccharide-23 07/07/2017   Rsv, Bivalent, Protein Subunit Rsvpref,pf Pattricia Bores) 08/11/2022   Td (Adult) 06/07/2016   Tdap 11/03/2004, 06/21/2022   Zoster Recombinant(Shingrix) 10/04/2019, 03/20/2020    Diabetes He presents for his follow-up diabetic visit. He has type 2 diabetes mellitus. Onset time: Diagnosed at approx age of 75. His disease course has been improving. There are no hypoglycemic associated symptoms. (He did not have any symptoms of hypoglycemia, despite glucose reading of 21. ) Pertinent negatives for diabetes include no blurred vision, no fatigue, no polydipsia, no polyuria and no weight loss. There are  no hypoglycemic complications. Symptoms are improving. Diabetic complications include heart disease and nephropathy. Risk factors for coronary artery disease include diabetes mellitus, dyslipidemia, hypertension and male sex. Current diabetic treatment includes diet and oral agent (monotherapy). He is compliant with treatment most of the time. His weight is fluctuating minimally.  He is following a generally healthy diet. When asked about meal planning, he reported none. He has not had a previous visit with a dietitian. He participates in exercise intermittently. His home blood glucose trend is decreasing steadily. (He presents today, accompanied by his wife, with no meter or logs to review.  He was not asked to routinely monitor glucose due to monotherapy with Metformin  only.  His most recent A1c on 3/11 was 6.3%, improving from last visit of 6.5%.  [facility] wanted him to start SGLT2i for heart protection properties, but he and his wife reached out for our opinion, and due to cost and side effect profile, we decided against it.  Ultimate goal is to de-escalate treatment plan.  He has managed well with lifestyle modifications until he was placed on high dose steroid taper for his RA flare.) An ACE inhibitor/angiotensin II receptor blocker is being taken. He does not see a podiatrist.Eye exam is current.  Hyperlipidemia This is a chronic problem. The current episode started more than 1 year ago. The problem is controlled. Recent lipid tests were reviewed and are normal. Exacerbating diseases include diabetes. Factors aggravating his hyperlipidemia include beta blockers. Current antihyperlipidemic treatment includes statins. The current treatment provides significant improvement of lipids. There are no compliance problems.  Risk factors for coronary artery disease include diabetes mellitus, dyslipidemia, hypertension and male sex.  Hypertension This is a chronic problem. The current episode started more than 1  year ago. The problem has been resolved since onset. The problem is controlled. Pertinent negatives include no blurred vision. There are no associated agents to hypertension. Risk factors for coronary artery disease include diabetes mellitus, dyslipidemia and male gender. Past treatments include angiotensin blockers and beta blockers. The current treatment provides moderate improvement. There are no compliance problems.  Hypertensive end-organ damage includes CAD/MI.    Review of systems  Constitutional: + stable body weight,  current Body mass index is 23.33 kg/m. , + fatigue, no subjective hyperthermia, no subjective hypothermia, + decreased appetite Eyes: + blurry vision (recently diagnosed with cataracts), no xerophthalmia ENT: no sore throat, no nodules palpated in throat, no dysphagia/odynophagia, no hoarseness Cardiovascular: no chest pain, no shortness of breath, no palpitations, no leg swelling Respiratory: no cough, no shortness of breath Gastrointestinal: no nausea/vomiting/diarrhea Musculoskeletal: + chronic muscle/joint aches (hx of RA) Skin: no rashes, no hyperemia Neurological: no tremors, no numbness, no tingling, no dizziness Psychiatric: no depression, no anxiety  Objective:     BP 118/68 (BP Location: Left Arm, Patient Position: Sitting, Cuff Size: Large)   Pulse 66   Ht 6' (1.829 m)   Wt 172 lb (78 kg)   BMI 23.33 kg/m   Wt Readings from Last 3 Encounters:  02/14/24 172 lb (78 kg)  02/01/24 174 lb (78.9 kg)  01/30/24 172 lb (78 kg)     BP Readings from Last 3 Encounters:  02/14/24 118/68  02/01/24 (!) 140/60  12/19/23 106/63     Physical Exam- Limited  Constitutional:  Body mass index is 23.33 kg/m. , not in acute distress, anxious state of mind Eyes:  EOMI, no exophthalmos Musculoskeletal: no gross deformities, strength intact in all four extremities, no gross restriction of joint movements Skin:  no rashes, no hyperemia Neurological: no tremor with  outstretched hands   Diabetic Foot Exam - Simple   No data filed     CMP ( most recent) CMP     Component Value Date/Time   NA 138 08/22/2022 1156  K 4.8 08/22/2022 1156   CL 103 08/22/2022 1156   CO2 21 08/22/2022 1156   GLUCOSE 142 (H) 08/22/2022 1156   GLUCOSE 448 (H) 11/28/2020 1328   BUN 14 06/14/2023 0000   CREATININE 1.1 12/12/2023 0000   CREATININE 1.27 08/22/2022 1156   CALCIUM  10.1 06/14/2023 0000   PROT 6.9 08/22/2022 1156   ALBUMIN 4.6 08/22/2022 1156   AST 26 08/22/2022 1156   ALT 33 08/22/2022 1156   ALKPHOS 50 08/22/2022 1156   BILITOT 1.3 (H) 08/22/2022 1156   GFRNONAA >60 11/28/2020 1328   GFRAA 81 06/24/2021 0000     Diabetic Labs (most recent): Lab Results  Component Value Date   HGBA1C 6.3 12/12/2023   HGBA1C 6.5 06/14/2023   HGBA1C 5.6 03/13/2023   MICROALBUR 0.3 12/12/2023   MICROALBUR 10 mg/L 11/30/2022   MICROALBUR 30 12/23/2021     Lipid Panel ( most recent) Lipid Panel     Component Value Date/Time   CHOL 117 12/12/2023 0000   CHOL 212 (H) 09/23/2015 1540   TRIG 71 12/12/2023 0000   HDL 42 12/12/2023 0000   HDL 41 09/23/2015 1540   CHOLHDL 3.7 11/26/2015 0815   VLDL 17 11/26/2015 0815   LDLCALC 58 12/12/2023 0000   LDLCALC 137 (H) 09/23/2015 1540   LABVLDL 34 09/23/2015 1540      Lab Results  Component Value Date   TSH 2.60 12/23/2021   TSH 2.69 06/24/2021   TSH 2.61 11/08/2018           Assessment & Plan:   1) Controlled Type 2 Diabetes without complications  He presents today, accompanied by his wife, with no meter or logs to review.  He was not asked to routinely monitor glucose due to monotherapy with Metformin  only.  His most recent A1c on 3/11 was 6.3%, improving from last visit of 6.5%.  [facility] wanted him to start SGLT2i for heart protection properties, but he and his wife reached out for our opinion, and due to cost and side effect profile, we decided against it.  Ultimate goal is to de-escalate  treatment plan.  He has managed well with lifestyle modifications until he was placed on high dose steroid taper for his RA flare.  - Grant Cardenas has currently uncontrolled symptomatic type 2 DM since 68 years of age.   -Recent labs reviewed.  - I had a long discussion with him about the progressive nature of diabetes and the pathology behind its complications. -his diabetes is complicated by CAD (history of MI) and he remains at a high risk for more acute and chronic complications which include CVA, CKD, retinopathy, and neuropathy. These are all discussed in detail with him.  - Nutritional counseling repeated at each appointment due to patients tendency to fall back in to old habits.  - The patient admits there is a room for improvement in their diet and drink choices. -  Suggestion is made for the patient to avoid simple carbohydrates from their diet including Cakes, Sweet Desserts / Pastries, Ice Cream, Soda (diet and regular), Sweet Tea, Candies, Chips, Cookies, Sweet Pastries, Store Bought Juices, Alcohol in Excess of 1-2 drinks a day, Artificial Sweeteners, Coffee Creamer, and "Sugar-free" Products. This will help patient to have stable blood glucose profile and potentially avoid unintended weight gain.   - I encouraged the patient to switch to unprocessed or minimally processed complex starch and increased protein intake (animal or plant source), fruits, and vegetables.   -  Patient is advised to stick to a routine mealtimes to eat 3 meals a day and avoid unnecessary snacks (to snack only to correct hypoglycemia).  - I have approached him with the following individualized plan to manage  his diabetes and patient agrees:   -Given optimal control overall, will stop Metformin  at this time and see how he manages with lifestyle modifications alone.  He is advised to check glucose as needed.  - he is not a good candidate for incretin therapy due to body habitus.  - Specific targets  for  A1c;  LDL, HDL,  and Triglycerides were discussed with the patient.  2) Blood Pressure /Hypertension:  his blood pressure is controlled to target.   he is advised to continue his current medications including Carvedilol  3.125 mg po twice daily and Losartan 50 mg p.o. daily with breakfast.  3) Lipids/Hyperlipidemia:    Review of his recent lipid panel from 12/23/21 showed controlled  LDL at 40 .  he  is advised to continue Lipitor  40 mg po daily at bedtime and Zetia 10 mg daily at bedtime.  Side effects and precautions discussed with him.  He does have elevated LFTs, which started soon after treatment for his psoriasis, he does follow with GI.  He does NOT drink alcohol.  [facility]  Will upload their note into the chart.  4)  Weight/Diet:  his Body mass index is 23.33 kg/m.  -  he is not a candidate for weight loss. Exercise, and detailed carbohydrates information provided  -  detailed on discharge instructions.  5) Chronic Care/Health Maintenance: -he is on ACEI/ARB and Statin medications and is encouraged to initiate and continue to follow up with Ophthalmology, Dentist, Podiatrist at least yearly or according to recommendations, and advised to stay away from smoking. I have recommended yearly flu vaccine and pneumonia vaccine at least every 5 years; moderate intensity exercise for up to 150 minutes weekly; and sleep for at least 7 hours a day.  - he is advised to maintain close follow up with Meldon Sport, MD for primary care needs, as well as his other providers for optimal and coordinated care.      I spent  33  minutes in the care of the patient today including review of labs from CMP, Lipids, Thyroid Function, Hematology (current and previous including abstractions from other facilities); face-to-face time discussing  his blood glucose readings/logs, discussing hypoglycemia and hyperglycemia episodes and symptoms, medications doses, his options of  short and long term treatment based on the latest standards of care / guidelines;  discussion about incorporating lifestyle medicine;  and documenting the encounter. Risk reduction counseling performed per USPSTF guidelines to reduce obesity and cardiovascular risk factors.     Please refer to Patient Instructions for Blood Glucose Monitoring and Insulin/Medications Dosing Guide"  in media tab for additional information. Please  also refer to " Patient Self Inventory" in the Media  tab for reviewed elements of pertinent patient history.  Grant Cardenas participated in the discussions, expressed understanding, and voiced agreement with the above plans.  All questions were answered to his satisfaction. he is encouraged to contact clinic should he have any questions or concerns prior to his return visit.   Follow up plan: - Return in about 4 months (around 06/16/2024) for Diabetes F/U with A1c in office, No previsit labs.  Hulon Magic, [facility] [address] [place], [place] [address] Phone: [phone] Fax:  [phone]  02/14/2024, 1:26 PM

## 2024-06-20 ENCOUNTER — Ambulatory Visit (INDEPENDENT_AMBULATORY_CARE_PROVIDER_SITE_OTHER): Admitting: Nurse Practitioner

## 2024-06-20 ENCOUNTER — Encounter: Payer: Self-pay | Admitting: Nurse Practitioner

## 2024-06-20 VITALS — BP 120/68 | HR 61 | Ht 73.0 in | Wt 175.2 lb

## 2024-06-20 DIAGNOSIS — Z7984 Long term (current) use of oral hypoglycemic drugs: Secondary | ICD-10-CM

## 2024-06-20 DIAGNOSIS — E119 Type 2 diabetes mellitus without complications: Secondary | ICD-10-CM

## 2024-06-20 NOTE — Progress Notes (Signed)
 Endocrinology Follow Up Note       06/20/2024, 11:40 AM   Subjective:    Patient ID: Grant Cardenas, male    DOB: 11/01/1955.  Grant Cardenas is being seen in follow up after being seen in consultation for management of currently uncontrolled symptomatic diabetes requested by  Tobie Suzzane POUR, MD.   Past Medical History:  Diagnosis Date   CAD (coronary artery disease)    a. anterior ST elevation MI in 09/2015. Cardiac cath showed occluded prox LAD and 60% ostial RCA stenosis. There was also very distal disease affecting the LAD and ramus. He underwent successful angioplasty and drug-eluting stent placement to the LAD. Ejection fraction was 35-40% which improved subsequently an echo to 55-60%.   Chronic obstructive pulmonary disease, unspecified (HCC)    COPD (chronic obstructive pulmonary disease) (HCC) 03/2016   4th stage-Dr. Vonzell   Essential (primary) hypertension    Hyperlipidemia    Ischemic cardiomyopathy    a. EF by cath 09/13/2015: 35-40%; b. 09/14/2015: EF 55-60%, Probable akinesis of  the midanteroseptal myocardium. akinesis of the apicalanterior myocardium, GR1DD   MI (myocardial infarction) (HCC)    Psoriasis    Psoriasis, unspecified    ST elevation (STEMI) myocardial infarction involving left anterior descending coronary artery (HCC)  09/2015 09/13/2015   Tobacco use    Vitreous floaters of both eyes     Past Surgical History:  Procedure Laterality Date   CARDIAC CATHETERIZATION N/A 09/13/2015   Procedure: Left Heart Cath and Coronary Angiography;  Surgeon: Deatrice DELENA Cage, MD;  Location: [facility];  Service: Cardiovascular;  Laterality: N/A;   CARDIAC CATHETERIZATION N/A 09/13/2015   Procedure: Coronary Stent Intervention;  Surgeon: Deatrice DELENA Cage, MD;  Location: [facility];  Service: Cardiovascular;  Laterality: N/A;   CARDIAC CATHETERIZATION N/A 11/03/2015    Procedure: Left Heart Cath and Coronary Angiography;  Surgeon: Peter M Swaziland, MD;  Location: [facility] INVASIVE CV LAB;  Service: Cardiovascular;  Laterality: N/A;   CATARACT EXTRACTION W/PHACO Left 04/14/2023   Procedure: CATARACT EXTRACTION PHACO AND INTRAOCULAR LENS PLACEMENT (IOC);  Surgeon: Juli Blunt, MD;  Location: [facility];  Service: Ophthalmology;  Laterality: Left;  CDE 7.00   CATARACT EXTRACTION W/PHACO Right 04/28/2023   Procedure: CATARACT EXTRACTION PHACO AND INTRAOCULAR LENS PLACEMENT (IOC);  Surgeon: Juli Blunt, MD;  Location: [facility];  Service: Ophthalmology;  Laterality: Right;  CDE 5.98   COLONOSCOPY N/A 01/29/2016   Procedure: COLONOSCOPY;  Surgeon: Margo LITTIE Haddock, MD;  Location: [facility];  Service: Endoscopy;  Laterality: N/A;  1030-moved to 1215 office to notify    COLONOSCOPY N/A 02/28/2020   Procedure: COLONOSCOPY;  Surgeon: Shaaron Lamar HERO, MD;  Location: [facility];  Service: Endoscopy;  Laterality: N/A;  9:30   ESOPHAGOGASTRODUODENOSCOPY N/A 01/29/2016   Procedure: ESOPHAGOGASTRODUODENOSCOPY (EGD);  Surgeon: Margo LITTIE Haddock, MD;  Location: [facility];  Service: Endoscopy;  Laterality: N/A;   POLYPECTOMY  02/28/2020   Procedure: POLYPECTOMY;  Surgeon: Shaaron Lamar HERO, MD;  Location: [facility];  Service: Endoscopy;;    Social History   Socioeconomic History   Marital status: Married    Spouse name: Not  on file   Number of children: Not on file   Years of education: Not on file   Highest education level: Not on file  Occupational History   Not on file  Tobacco Use   Smoking status: Former    Current packs/day: 0.00    Average packs/day: 0.5 packs/day for 40.0 years (20.0 ttl pk-yrs)    Types: Cigarettes    Start date: 09/14/1975    Quit date: 09/14/2015    Years since quitting: 8.7   Smokeless tobacco: Never  Vaping Use   Vaping status: Former  Substance and Sexual Activity   Alcohol use: No    Alcohol/week: 0.0 standard drinks of alcohol     Comment: Previously heavy beer drinker; quit 20 years ago   Drug use: No   Sexual activity: Never  Other Topics Concern   Not on file  Social History Narrative   Not on file   Social Drivers of Health   Financial Resource Strain: Low Risk  (01/30/2024)   Overall Financial Resource Strain (CARDIA)    Difficulty of Paying Living Expenses: Not hard at all  Food Insecurity: No Food Insecurity (03/28/2024)   Received from [facility]   Hunger Vital Sign    Within the past 12 months, you worried that your food would run out before you got the money to buy more.: Never true    Within the past 12 months, the food you bought just didn't last and you didn't have money to get more.: Never true  Transportation Needs: No Transportation Needs (03/28/2024)   Received from [facility] - Transportation    In the past 12 months, has lack of transportation kept you from medical appointments or from getting medications?: No    Lack of Transportation (Non-Medical): No  Physical Activity: Sufficiently Active (01/30/2024)   Exercise Vital Sign    Days of Exercise per Week: 7 days    Minutes of Exercise per Session: 30 min  Stress: No Stress Concern Present (01/30/2024)   [employer] of Occupational Health - Occupational Stress Questionnaire    Feeling of Stress : Not at all  Social Connections: Moderately Isolated (01/30/2024)   Social Connection and Isolation Panel    Frequency of Communication with Friends and Family: Once a week    Frequency of Social Gatherings with Friends and Family: Once a week    Attends Religious Services: More than 4 times per year    Active Member of [employer] or Organizations: No    Attends Banker Meetings: Never    Marital Status: Married    Family History  Problem Relation Age of Onset   Heart Problems Mother    Heart attack Father    Heart disease Father    Cancer Other    Colon cancer Maternal Aunt      Outpatient Encounter Medications as of 06/20/2024  Medication Sig   acetaminophen  (TYLENOL ) 325 MG tablet Take 650 mg by mouth.   Albuterol Sulfate 108 (90 Base) MCG/ACT AEPB Inhale 1-2 puffs into the lungs 4 (four) times daily as needed (wheezing/shortness of breath.).    aspirin  EC 81 MG tablet Take 1 tablet (81 mg total) by mouth daily.   atorvastatin  (LIPITOR ) 40 MG tablet TAKE ONE TABLET BY MOUTH AT BEDTIME FOR CHOLESTEROL   Calcium  Carb-Cholecalciferol 600-10 MG-MCG TABS Take 1 tablet by mouth 2 (two) times daily.   calcium  carbonate (OS-CAL) 1250 (500 Ca) MG chewable tablet CHEW  1-2 TABLETS BY MOUTH AT BEDTIME AS NEEDED FOR INDIGESTION   Carboxymethylcellulose Sod PF 0.25 % SOLN Place 1 drop into both eyes 4 (four) times daily as needed (dry/irritated eyes.).    Cholecalciferol 25 MCG (1000 UT) tablet Take 1 tablet by mouth daily.   clobetasol ointment (TEMOVATE) 0.05 % Apply 1 application topically 2 (two) times daily as needed (psoriasis).    DULoxetine (CYMBALTA) 30 MG capsule Take 30 mg by mouth daily.   ezetimibe (ZETIA) 10 MG tablet Take 1 tablet by mouth daily.   fluticasone  (CUTIVATE ) 0.05 % cream Apply 1 application topically 2 (two) times daily as needed (psoriasis (face & groin areas)).    fluticasone  (FLONASE ) 50 MCG/ACT nasal spray SPRAY 2 SPRAYS INTO EACH NOSTRIL EVERY DAY   ibuprofen  (ADVIL ) 600 MG tablet Take 1 tablet (600 mg total) by mouth every 8 (eight) hours as needed.   ketoconazole  (NIZORAL ) 2 % shampoo Apply 1 application topically 2 (two) times a week. Applied to facial hair (beard) & scalp. Leave on 10 minutes prior to washing out.   levocetirizine (XYZAL ) 5 MG tablet TAKE 1 TABLET BY MOUTH EVERY DAY IN THE EVENING   meloxicam (MOBIC) 15 MG tablet Take 15 mg by mouth daily.   Menthol-Methyl Salicylate (THERA-GESIC) 0.5-15 % CREA APPLY MODERATE AMOUNT TO AFFECTED AREA TWICE A DAY   methocarbamol (ROBAXIN) 500 MG tablet Take 500 mg by mouth.   nitroGLYCERIN   (NITROSTAT ) 0.4 MG SL tablet Place 1 tablet (0.4 mg total) under the tongue every 5 (five) minutes as needed for chest pain.   pantoprazole  (PROTONIX ) 40 MG tablet Take 40 mg by mouth daily.   umeclidinium-vilanterol (ANORO ELLIPTA) 62.5-25 MCG/INH AEPB Inhale 1 puff into the lungs daily.   Ustekinumab (STELARA [place]) Inject 1 Dose into the skin every 3 (three) months. Every 12 weeks   Empagliflozin -metFORMIN  HCl ER 07-999 MG TB24 Take by mouth. (Patient not taking: Reported on 06/20/2024)   losartan (COZAAR) 50 MG tablet Take 50 mg by mouth 2 (two) times daily.   metFORMIN  (GLUCOPHAGE ) 500 MG tablet Take 1 tablet (500 mg total) by mouth daily with breakfast. (Patient not taking: Reported on 06/20/2024)   No facility-administered encounter medications on file as of 06/20/2024.    ALLERGIES: Allergies  Allergen Reactions   Sulfa Antibiotics Other (See Comments)    Sulfa drugs (unaware of this reaction per patient spouse)      VACCINATION STATUS: Immunization History  Administered Date(s) Administered    sv, Bivalent, Protein Subunit Rsvpref,pf (Abrysvo) 08/11/2022   Fluad Quad(high Dose 65+) 07/03/2020, 07/13/2021   INFLUENZA, HIGH DOSE SEASONAL PF 09/05/2016, 06/21/2022, 07/19/2023   Influenza Inj Mdck Quad Pf 08/11/2016   Influenza, Seasonal, Injecte, Preservative Fre 06/18/2019   Influenza,inj,Quad PF,6+ Mos 07/07/2017, 07/02/2018, 06/18/2019   Influenza-Unspecified 06/18/2019, 06/21/2022   Moderna Covid-19 Fall Seasonal Vaccine 24yrs & older 08/04/2022, 07/19/2023   Moderna Covid-19 Vaccine Bivalent Booster 49yrs & up 06/24/2021   Moderna Sars-Covid-2 Vaccination 11/07/2019, 12/05/2019, 08/04/2020, 01/12/2021   PNEUMOCOCCAL CONJUGATE-20 12/19/2023   Pneumococcal Conjugate-13 06/07/2016   Pneumococcal Polysaccharide-23 07/07/2017   Td (Adult) 06/07/2016   Tdap 11/03/2004, 06/21/2022   Zoster Recombinant(Shingrix) 10/04/2019, 03/20/2020    Diabetes He presents for his follow-up  diabetic visit. He has type 2 diabetes mellitus. Onset time: Diagnosed at approx age of 87. His disease course has been improving. There are no hypoglycemic associated symptoms. (He did not have any symptoms of hypoglycemia, despite glucose reading of 21. ) Pertinent negatives for diabetes include  no fatigue, no polydipsia, no polyuria and no weight loss. There are no hypoglycemic complications. Symptoms are improving. Diabetic complications include heart disease and nephropathy. Risk factors for coronary artery disease include diabetes mellitus, dyslipidemia, hypertension and male sex. Current diabetic treatment includes diet and oral agent (monotherapy). He is compliant with treatment most of the time. His weight is fluctuating minimally. He is following a generally healthy diet. When asked about meal planning, he reported none. He has not had a previous visit with a dietitian. He participates in exercise intermittently. His home blood glucose trend is decreasing steadily. (He presents today, accompanied by his wife, with no meter or logs to review.  He was not asked to routinely monitor glucose.  His most recent A1c on 7/16 was 6.2%, improving from last visit of 6.3%.  He was taken off all DM meds last visit and has been managing with lifestyle modifications alone.  ) An ACE inhibitor/angiotensin II receptor blocker is being taken. He does not see a podiatrist.Eye exam is current.    Review of systems  Constitutional: + stable body weight,  current Body mass index is 23.11 kg/m. , + fatigue, no subjective hyperthermia, no subjective hypothermia, + decreased appetite Eyes: + blurry vision (recently diagnosed with cataracts), no xerophthalmia ENT: no sore throat, no nodules palpated in throat, no dysphagia/odynophagia, no hoarseness Cardiovascular: no chest pain, no shortness of breath, no palpitations, no leg swelling Respiratory: no cough, no shortness of breath Gastrointestinal: no  nausea/vomiting/diarrhea Musculoskeletal: + chronic muscle/joint aches (hx of RA) Skin: no rashes, no hyperemia Neurological: no tremors, no numbness, no tingling, no dizziness Psychiatric: no depression, no anxiety  Objective:     BP 120/68 (BP Location: Left Arm, Patient Position: Sitting, Cuff Size: Large)   Pulse 61   Ht 6' 1 (1.854 m)   Wt 175 lb 3.2 oz (79.5 kg)   BMI 23.11 kg/m   Wt Readings from Last 3 Encounters:  06/20/24 175 lb 3.2 oz (79.5 kg)  02/14/24 172 lb (78 kg)  02/01/24 174 lb (78.9 kg)     BP Readings from Last 3 Encounters:  06/20/24 120/68  02/14/24 118/68  02/01/24 (!) 140/60     Physical Exam- Limited  Constitutional:  Body mass index is 23.11 kg/m. , not in acute distress, anxious state of mind Eyes:  EOMI, no exophthalmos Musculoskeletal: no gross deformities, strength intact in all four extremities, no gross restriction of joint movements Skin:  no rashes, no hyperemia Neurological: no tremor with outstretched hands   Diabetic Foot Exam - Simple   No data filed     CMP ( most recent) CMP     Component Value Date/Time   NA 138 08/22/2022 1156   K 4.8 08/22/2022 1156   CL 103 08/22/2022 1156   CO2 21 08/22/2022 1156   GLUCOSE 142 (H) 08/22/2022 1156   GLUCOSE 448 (H) 11/28/2020 1328   BUN 14 06/14/2023 0000   CREATININE 1.1 12/12/2023 0000   CREATININE 1.27 08/22/2022 1156   CALCIUM  10.1 06/14/2023 0000   PROT 6.9 08/22/2022 1156   ALBUMIN 4.6 08/22/2022 1156   AST 26 08/22/2022 1156   ALT 33 08/22/2022 1156   ALKPHOS 50 08/22/2022 1156   BILITOT 1.3 (H) 08/22/2022 1156   GFRNONAA 73 12/12/2023 0000   GFRAA 81 06/24/2021 0000     Diabetic Labs (most recent): Lab Results  Component Value Date   HGBA1C 6.3 12/12/2023   HGBA1C 6.5 06/14/2023   HGBA1C 5.6 03/13/2023  MICROALBUR 0.3 12/12/2023   MICROALBUR 10 mg/L 11/30/2022   MICROALBUR 30 12/23/2021     Lipid Panel ( most recent) Lipid Panel     Component Value  Date/Time   CHOL 117 12/12/2023 0000   CHOL 212 (H) 09/23/2015 1540   TRIG 71 12/12/2023 0000   HDL 42 12/12/2023 0000   HDL 41 09/23/2015 1540   CHOLHDL 3.7 11/26/2015 0815   VLDL 17 11/26/2015 0815   LDLCALC 58 12/12/2023 0000   LDLCALC 137 (H) 09/23/2015 1540   LABVLDL 34 09/23/2015 1540      Lab Results  Component Value Date   TSH 2.60 12/23/2021   TSH 2.69 06/24/2021   TSH 2.61 11/08/2018           Assessment & Plan:   1) Controlled Type 2 Diabetes without complications  He presents today, accompanied by his wife, with no meter or logs to review.  He was not asked to routinely monitor glucose.  His most recent A1c on 7/16 was 6.2%, improving from last visit of 6.3%.  He was taken off all DM meds last visit and has been managing with lifestyle modifications alone.    - Grant Cardenas has currently uncontrolled symptomatic type 2 DM since 68 years of age.   -Recent labs reviewed.  - I had a long discussion with him about the progressive nature of diabetes and the pathology behind its complications. -his diabetes is complicated by CAD (history of MI) and he remains at a high risk for more acute and chronic complications which include CVA, CKD, retinopathy, and neuropathy. These are all discussed in detail with him.  - Nutritional counseling repeated at each appointment due to patients tendency to fall back in to old habits.  - The patient admits there is a room for improvement in their diet and drink choices. -  Suggestion is made for the patient to avoid simple carbohydrates from their diet including Cakes, Sweet Desserts / Pastries, Ice Cream, Soda (diet and regular), Sweet Tea, Candies, Chips, Cookies, Sweet Pastries, Store Bought Juices, Alcohol in Excess of 1-2 drinks a day, Artificial Sweeteners, Coffee Creamer, and Sugar-free Products. This will help patient to have stable blood glucose profile and potentially avoid unintended weight gain.   - I encouraged  the patient to switch to unprocessed or minimally processed complex starch and increased protein intake (animal or plant source), fruits, and vegetables.   - Patient is advised to stick to a routine mealtimes to eat 3 meals a day and avoid unnecessary snacks (to snack only to correct hypoglycemia).  - I have approached him with the following individualized plan to manage  his diabetes and patient agrees:   -Given optimal control overall, he can stay off medications.  He is advised to check glucose as needed.  - he is not a good candidate for incretin therapy due to body habitus.  - Specific targets for  A1c;  LDL, HDL,  and Triglycerides were discussed with the patient.  2) Blood Pressure /Hypertension:  his blood pressure is controlled to target.   he is advised to continue his current medications as prescribed by PCP.  3) Lipids/Hyperlipidemia:    Review of his recent lipid panel from 12/23/21 showed controlled  LDL at 40 .  he  is advised to continue Lipitor  40 mg po daily at bedtime and Zetia 10 mg daily at bedtime.  Side effects and precautions discussed with him.  He does have elevated LFTs, which started soon  after treatment for his psoriasis, he does follow with GI.  He does NOT drink alcohol.  [facility]    4)  Weight/Diet:  his Body mass index is 23.11 kg/m.  -  he is not a candidate for weight loss. Exercise, and detailed carbohydrates information provided  -  detailed on discharge instructions.  5) Chronic Care/Health Maintenance: -he is on ACEI/ARB and Statin medications and is encouraged to initiate and continue to follow up with Ophthalmology, Dentist, Podiatrist at least yearly or according to recommendations, and advised to stay away from smoking. I have recommended yearly flu vaccine and pneumonia vaccine at least every 5 years; moderate intensity exercise for up to 150 minutes weekly; and sleep for at least 7 hours a day.  - he is advised to  maintain close follow up with Tobie Suzzane POUR, MD for primary care needs, as well as his other providers for optimal and coordinated care.     I spent  14  minutes in the care of the patient today including review of labs from CMP, Lipids, Thyroid Function, Hematology (current and previous including abstractions from other facilities); face-to-face time discussing  his blood glucose readings/logs, discussing hypoglycemia and hyperglycemia episodes and symptoms, medications doses, his options of short and long term treatment based on the latest standards of care / guidelines;  discussion about incorporating lifestyle medicine;  and documenting the encounter. Risk reduction counseling performed per USPSTF guidelines to reduce obesity and cardiovascular risk factors.     Please refer to Patient Instructions for Blood Glucose Monitoring and Insulin/Medications Dosing Guide  in media tab for additional information. Please  also refer to  Patient Self Inventory in the Media  tab for reviewed elements of pertinent patient history.  Grant Cardenas participated in the discussions, expressed understanding, and voiced agreement with the above plans.  All questions were answered to his satisfaction. he is encouraged to contact clinic should he have any questions or concerns prior to his return visit.   Follow up plan: - Return in about 6 months (around 12/18/2024) for Diabetes F/U with A1c in office.  Benton Rio, [facility] [address] [place], [place] [address] Phone: [phone] Fax: [phone]  06/20/2024, 11:40 AM

## 2024-12-18 ENCOUNTER — Ambulatory Visit: Admitting: Nurse Practitioner

## 2025-02-03 ENCOUNTER — Ambulatory Visit
